# Patient Record
Sex: Female | Born: 1965
Health system: Southern US, Community
[De-identification: ages and names within clinical notes are randomized; demographics above are authoritative.]

## PROBLEM LIST (undated history)

## (undated) DIAGNOSIS — Z789 Other specified health status: Secondary | ICD-10-CM

## (undated) DIAGNOSIS — I10 Essential (primary) hypertension: Secondary | ICD-10-CM

## (undated) DIAGNOSIS — R519 Headache, unspecified: Secondary | ICD-10-CM

## (undated) DIAGNOSIS — M1990 Unspecified osteoarthritis, unspecified site: Secondary | ICD-10-CM

## (undated) DIAGNOSIS — E785 Hyperlipidemia, unspecified: Secondary | ICD-10-CM

## (undated) DIAGNOSIS — K8020 Calculus of gallbladder without cholecystitis without obstruction: Secondary | ICD-10-CM

## (undated) DIAGNOSIS — F1090 Alcohol use, unspecified, uncomplicated: Secondary | ICD-10-CM

## (undated) DIAGNOSIS — Z7289 Other problems related to lifestyle: Secondary | ICD-10-CM

## (undated) HISTORY — PX: CHOLECYSTECTOMY: SHX55

## (undated) HISTORY — DX: Other specified health status: Z78.9

## (undated) HISTORY — DX: Other problems related to lifestyle: Z72.89

## (undated) HISTORY — PX: WISDOM TOOTH EXTRACTION: SHX21

## (undated) HISTORY — DX: Hyperlipidemia, unspecified: E78.5

## (undated) HISTORY — DX: Unspecified osteoarthritis, unspecified site: M19.90

## (undated) HISTORY — DX: Calculus of gallbladder without cholecystitis without obstruction: K80.20

## (undated) HISTORY — DX: Alcohol use, unspecified, uncomplicated: F10.90

## (undated) HISTORY — DX: Essential (primary) hypertension: I10

---

## 2003-06-08 HISTORY — PX: FOOT SURGERY: SHX648

## 2007-06-08 HISTORY — PX: FOOT SURGERY: SHX648

## 2015-06-13 ENCOUNTER — Ambulatory Visit (INDEPENDENT_AMBULATORY_CARE_PROVIDER_SITE_OTHER): Payer: BLUE CROSS/BLUE SHIELD | Admitting: Family Medicine

## 2015-06-13 VITALS — BP 130/108 | HR 86 | Temp 97.7°F | Resp 18 | Ht 66.0 in | Wt 260.4 lb

## 2015-06-13 DIAGNOSIS — L299 Pruritus, unspecified: Secondary | ICD-10-CM

## 2015-06-13 DIAGNOSIS — F17200 Nicotine dependence, unspecified, uncomplicated: Secondary | ICD-10-CM | POA: Diagnosis not present

## 2015-06-13 DIAGNOSIS — I10 Essential (primary) hypertension: Secondary | ICD-10-CM | POA: Diagnosis not present

## 2015-06-13 DIAGNOSIS — Z23 Encounter for immunization: Secondary | ICD-10-CM | POA: Diagnosis not present

## 2015-06-13 LAB — COMPREHENSIVE METABOLIC PANEL
ALT: 110 U/L — AB (ref 6–29)
AST: 42 U/L — ABNORMAL HIGH (ref 10–35)
Albumin: 4.4 g/dL (ref 3.6–5.1)
Alkaline Phosphatase: 94 U/L (ref 33–130)
BUN: 12 mg/dL (ref 7–25)
CHLORIDE: 104 mmol/L (ref 98–110)
CO2: 24 mmol/L (ref 20–31)
Calcium: 9.4 mg/dL (ref 8.6–10.4)
Creat: 0.64 mg/dL (ref 0.50–1.05)
Glucose, Bld: 96 mg/dL (ref 65–99)
POTASSIUM: 4.2 mmol/L (ref 3.5–5.3)
SODIUM: 136 mmol/L (ref 135–146)
TOTAL PROTEIN: 7.3 g/dL (ref 6.1–8.1)
Total Bilirubin: 0.8 mg/dL (ref 0.2–1.2)

## 2015-06-13 LAB — LIPID PANEL
CHOL/HDL RATIO: 3.8 ratio (ref <=5.0)
CHOLESTEROL: 188 mg/dL (ref 125–200)
HDL: 49 mg/dL (ref >=46)
LDL CALC: 123 mg/dL (ref <130)
TRIGLYCERIDES: 81 mg/dL (ref <150)
VLDL: 16 mg/dL (ref <30)

## 2015-06-13 MED ORDER — PERMETHRIN 5 % EX CREA: 1.0000 "application " | TOPICAL_CREAM | Freq: Once | CUTANEOUS | Status: DC

## 2015-06-13 MED ORDER — LISINOPRIL-HYDROCHLOROTHIAZIDE 10-12.5 MG PO TABS
1.0000 | ORAL_TABLET | Freq: Every day | ORAL | Status: DC
Start: 2015-06-13 — End: 2015-09-23

## 2015-06-13 NOTE — Patient Instructions (Addendum)
Apply permethrin lotion from neck to feet at bedtime. The next morning take a shower and wash it off. Bed clothes and sheets. Do this only once.  Take Zyrtec (cetirizine) over-the-counter 1 pill 10 mg every evening for itching  Return if getting more rash or if itching getting worse  Take your blood pressure medicine lisinopril/HCT 10/12.5 one daily, best taken in the morning  Think seriously about picking a quit date and stopping smoking  Work hard on losing weight  Get regular exercise  Plan to schedule a screening colonoscopy sometime in the near future. If you will call the office we will schedule that for you.  Plan to schedule a mammogram sometime in the near future. You can call the [facility] and arrange that for yourself.  Flu shot today

## 2015-06-13 NOTE — Progress Notes (Signed)
Patient ID: Kathryn Delgado, female    DOB: 1966/03/31  Age: 50 y.o. MRN: [id number]  Chief Complaint  Patient presents with  . Hypertension  . skin irritation    severe itch mostly at night - was exposed to scabbies    Subjective:   50 year old lady who moved here from [place] about 3 years ago. She drives back and forth around the country. She is getting a job at [employer] soon. She has a history of hypertension, otherwise is been pretty healthy. Has not had any surgeries. She has not had any pregnancies. She does smoke about half pack of cigarettes a day which she's done for many years. She does not do a regular exercise program, and has had a good deal of weight gain in recent years. She has no major complaints. HEENT is normal. Cardiovascular normal. Respiratory all. GI GU normal. She has not had a colonoscopy ever. Muscular skeletal unremarkable. Neurologic occasional headache, nothing major.  Patient has been having a bad itching especially at night. Her aunt who lives in the same facility with her who has dementia had a similar problem and was treated for presumptive scabies. She still scratches some but not like she was. It is a little hard to tell on her because of her mental status.  Current allergies, medications, problem list, past/family and social histories reviewed.  Objective:  BP 130/108 mmHg  Pulse 86  Temp(Src) 97.7 F (36.5 C) (Oral)  Resp 18  Ht 5\' 6"  (1.676 m)  Wt 260 lb 6.4 oz (118.117 kg)  BMI 42.05 kg/m2  SpO2 99%  Pleasant lady, alert and oriented. TMs are normal. Eyes PERRLA. Throat clear. Neck supple without significant nodes. Chest is clear to auscultation. Heart regular without murmur. Abdomen soft without mass or tenderness. Extremities normal. No major rashes. She has a few little bumps on her abdomen it itch. Has a small cystic area left posterior thigh that looks like a healed superficial abscess.  Assessment & Plan:   Assessment: 1. Essential hypertension    2. Tobacco use disorder   3. Itching   4. Needs flu shot       Plan: Hypertension will need medication. She has been on lisinopril and a diuretic in the past. Talk her about the need to get off of cigarettes. Encourage exercise. Encouraged getting a colonoscopy at some point. Has not had a mammogram. Advised that is a reasonable thing for her to consider also.  Orders Placed This Encounter  Procedures  . Flu Vaccine QUAD 36+ mos IM  . Comprehensive metabolic panel  . Lipid panel    Meds ordered this encounter  Medications  . lisinopril-hydrochlorothiazide (PRINZIDE,ZESTORETIC) 10-12.5 MG tablet    Sig: Take 1 tablet by mouth daily.    Dispense:  90 tablet    Refill:  1  . permethrin (ELIMITE) 5 % cream    Sig: Apply 1 application topically once.    Dispense:  60 g    Refill:  0         Patient Instructions  Apply permethrin lotion from neck to feet at bedtime. The next morning take a shower and wash it off. Bed clothes and sheets. Do this only once.  Take Zyrtec (cetirizine) over-the-counter 1 pill 10 mg every evening for itching  Return if getting more rash or if itching getting worse  Take your blood pressure medicine lisinopril/HCT 10/12.5 one daily, best taken in the morning  Think seriously about picking a quit date and stopping smoking  Work hard on losing weight  Get regular exercise  Plan to schedule a screening colonoscopy sometime in the near future. If you will call the office we will schedule that for you.  Plan to schedule a mammogram sometime in the near future. You can call the [facility] and arrange that for yourself.  Flu shot today     Return in about 3 months (around 09/11/2015).   HOPPER,DAVID, MD 06/13/2015

## 2015-09-23 ENCOUNTER — Ambulatory Visit (INDEPENDENT_AMBULATORY_CARE_PROVIDER_SITE_OTHER): Payer: BLUE CROSS/BLUE SHIELD | Admitting: Physician Assistant

## 2015-09-23 VITALS — BP 126/82 | HR 94 | Temp 98.0°F | Resp 18 | Ht 66.0 in | Wt 251.2 lb

## 2015-09-23 DIAGNOSIS — R202 Paresthesia of skin: Secondary | ICD-10-CM | POA: Diagnosis not present

## 2015-09-23 DIAGNOSIS — M545 Low back pain, unspecified: Secondary | ICD-10-CM

## 2015-09-23 DIAGNOSIS — M79609 Pain in unspecified limb: Secondary | ICD-10-CM

## 2015-09-23 DIAGNOSIS — G8929 Other chronic pain: Secondary | ICD-10-CM

## 2015-09-23 DIAGNOSIS — I10 Essential (primary) hypertension: Secondary | ICD-10-CM

## 2015-09-23 LAB — TSH: TSH: 0.73 mIU/L

## 2015-09-23 MED ORDER — LISINOPRIL-HYDROCHLOROTHIAZIDE 10-12.5 MG PO TABS: 1.0000 | ORAL_TABLET | Freq: Every day | ORAL | Status: DC

## 2015-09-23 NOTE — Patient Instructions (Addendum)
Take one aleve in the morning and and night for two weeks.  After two weeks take one aleve as needed.  If you pain becomes worse over the next 6 months please return for imaging of your back.  Please continue to monitor your drinking and continue to lose weight, as weight loss will also help your back pain.     IF you received an x-ray today, you will receive an invoice from [facility]. Please contact [facility] at [phone] with questions or concerns regarding your invoice.   IF you received labwork today, you will receive an invoice from [facility]. Please contact [facility] at [phone] with questions or concerns regarding your invoice.   Our billing staff will not be able to assist you with questions regarding bills from these companies.  You will be contacted with the lab results as soon as they are available. The fastest way to get your results is to activate your My Chart account. Instructions are located on the last page of this paperwork. If you have not heard from us regarding the results in 2 weeks, please contact this office.

## 2015-09-23 NOTE — Progress Notes (Signed)
09/23/2015 4:11 PM   DOB: 1966/03/30 / MRN: [id number]  SUBJECTIVE:  Kathryn Delgado is a 50 y.o. female presenting for BP medication refills.  Has been taking her medication without fail.  Denies chest pain, SOB, DOE, HA and changes in vision.  Feels well.    Complains of right thigh paresthesia that is mild to moderate, made worse with standing for long periods.  Denies weakness and change of sensation in the leg.  Feels that this is getting worse. Associates mid line low back pain that has been present for years.  No bowel or bladder incontinence.  She has not taken anything because she thought she had a kidney problems.  CHL reveal a normal creatinine and mild elevation in the AST/ALT.  She has cut back on her drinking after being advised by Dr. Alwyn Ren to cut back. She has lost 10 lbs since reducing.    She has No Known Allergies.   She  has a past medical history of Hypertension.    She  reports that she has been smoking.  She does not have any smokeless tobacco history on file. She reports that she drinks about 1.2 oz of alcohol per week. She reports that she does not use illicit drugs. She  reports that she does not currently engage in sexual activity. The patient  has past surgical history that includes Foot surgery (2005).  Her family history includes Diabetes in her brother, mother, and sister; Thyroid disease in her sister and sister.  Review of Systems  Constitutional: Negative for fever.  Gastrointestinal: Negative for nausea.  Musculoskeletal: Positive for back pain. Negative for myalgias, joint pain, falls and neck pain.  Skin: Negative for rash.  Neurological: Negative for dizziness, tremors, sensory change, focal weakness and headaches.    Problem list and medications reviewed and updated by myself where necessary, and exist elsewhere in the encounter.   OBJECTIVE:  BP 126/82 mmHg  Pulse 94  Temp(Src) 98 F (36.7 C) (Oral)  Resp 18  Ht  (1.676 m)  Wt 251 lb 3.2  oz (113.944 kg)  BMI 40.56 kg/m2  SpO2 98%  Physical Exam  Constitutional: She is oriented to person, place, and time. She appears well-nourished. No distress.  Eyes: EOM are normal. Pupils are equal, round, and reactive to light.  Cardiovascular: Normal rate, regular rhythm and normal heart sounds.   Pulmonary/Chest: Effort normal and breath sounds normal. She has no rales.  Abdominal: She exhibits no distension.  Neurological: She is alert and oriented to person, place, and time. She has normal strength. No cranial nerve deficit or sensory deficit. Gait normal.  Reflex Scores:      Patellar reflexes are 2+ on the right side and 2+ on the left side.      Achilles reflexes are 2+ on the right side and 2+ on the left side. Negative for clonus.  Heel and toe walking intact to challenge.  Knee flexion/extension 5/5. Negative for atrophy on the left side with compared to right.   Skin: Skin is dry. She is not diaphoretic.  Psychiatric: She has a normal mood and affect.  Vitals reviewed.   No results found for this or any previous visit (from the past 72 hour(s)).  No results found.  ASSESSMENT AND PLAN  Berthe was seen today for medication refill and thigh numbness.  Diagnoses and all orders for this visit:  Essential hypertension -     lisinopril-hydrochlorothiazide (PRINZIDE,ZESTORETIC) 10-12.5 MG tablet; Take 1 tablet by  mouth daily. -     COMPLETE METABOLIC PANEL WITH GFR -     TSH  Paresthesia and pain of left extremity: Left leg 2/2 to problem 3. Advised naprosyn otc bid with food for the next two weeks, then as needed.  Advised prednisone may help but given nothing else has been tried Naprosyn is a good starting point.  Will hold on rads as negative or positive findings will not change the plan.  She denies a history of diabetes.  She states "I will not go to ortho for this problem I will tell you that right now."  Chronic low back pain: See problem 1.    The patient was  advised to call or return to clinic if she does not see an improvement in symptoms or to seek the care of the closest emergency department if she worsens with the above plan.   Deliah BostonMichael Clark, MHS, PA-C [facility] 09/23/2015 4:11 PM

## 2015-09-24 LAB — COMPLETE METABOLIC PANEL WITH GFR
ALT: 55 U/L — AB (ref 6–29)
AST: 28 U/L (ref 10–35)
Albumin: 4.4 g/dL (ref 3.6–5.1)
Alkaline Phosphatase: 79 U/L (ref 33–130)
BILIRUBIN TOTAL: 0.6 mg/dL (ref 0.2–1.2)
BUN: 11 mg/dL (ref 7–25)
CALCIUM: 9.3 mg/dL (ref 8.6–10.4)
CO2: 19 mmol/L — AB (ref 20–31)
CREATININE: 0.61 mg/dL (ref 0.50–1.05)
Chloride: 106 mmol/L (ref 98–110)
GFR, Est Non African American: >89 mL/min (ref >=60)
Glucose, Bld: 109 mg/dL — ABNORMAL HIGH (ref 65–99)
Potassium: 4 mmol/L (ref 3.5–5.3)
Sodium: 136 mmol/L (ref 135–146)
TOTAL PROTEIN: 7.4 g/dL (ref 6.1–8.1)

## 2015-09-25 ENCOUNTER — Encounter: Payer: Self-pay | Admitting: *Deleted

## 2016-01-12 ENCOUNTER — Ambulatory Visit (INDEPENDENT_AMBULATORY_CARE_PROVIDER_SITE_OTHER): Payer: BLUE CROSS/BLUE SHIELD | Admitting: Urgent Care

## 2016-01-12 VITALS — BP 124/60 | HR 83 | Temp 98.2°F | Resp 17 | Ht 66.0 in | Wt 247.0 lb

## 2016-01-12 DIAGNOSIS — M79601 Pain in right arm: Secondary | ICD-10-CM

## 2016-01-12 DIAGNOSIS — M79602 Pain in left arm: Secondary | ICD-10-CM | POA: Diagnosis not present

## 2016-01-12 DIAGNOSIS — I10 Essential (primary) hypertension: Secondary | ICD-10-CM

## 2016-01-12 DIAGNOSIS — M778 Other enthesopathies, not elsewhere classified: Secondary | ICD-10-CM

## 2016-01-12 DIAGNOSIS — M779 Enthesopathy, unspecified: Secondary | ICD-10-CM

## 2016-01-12 LAB — CBC WITH DIFFERENTIAL/PLATELET
BASOS PCT: 0 %
Basophils Absolute: 0 cells/uL (ref 0–200)
Eosinophils Absolute: 106 cells/uL (ref 15–500)
Eosinophils Relative: 2 %
HEMATOCRIT: 41.1 % (ref 35.0–45.0)
Hemoglobin: 13.1 g/dL (ref 11.7–15.5)
LYMPHS PCT: 38 %
Lymphs Abs: 2014 cells/uL (ref 850–3900)
MCH: 27.8 pg (ref 27.0–33.0)
MCHC: 31.9 g/dL — AB (ref 32.0–36.0)
MCV: 87.3 fL (ref 80.0–100.0)
MPV: 10.9 fL (ref 7.5–12.5)
Monocytes Absolute: 371 cells/uL (ref 200–950)
Monocytes Relative: 7 %
NEUTROS ABS: 2809 {cells}/uL (ref 1500–7800)
NEUTROS PCT: 53 %
Platelets: 212 10*3/uL (ref 140–400)
RBC: 4.71 MIL/uL (ref 3.80–5.10)
RDW: 14 % (ref 11.0–15.0)
WBC: 5.3 10*3/uL (ref 3.8–10.8)

## 2016-01-12 LAB — BASIC METABOLIC PANEL
BUN: 13 mg/dL (ref 7–25)
CHLORIDE: 108 mmol/L (ref 98–110)
CO2: 23 mmol/L (ref 20–31)
Calcium: 9 mg/dL (ref 8.6–10.4)
Creat: 0.66 mg/dL (ref 0.50–1.05)
Glucose, Bld: 88 mg/dL (ref 65–99)
POTASSIUM: 4 mmol/L (ref 3.5–5.3)
Sodium: 141 mmol/L (ref 135–146)

## 2016-01-12 MED ORDER — PREDNISONE 20 MG PO TABS: 40.0000 mg | ORAL_TABLET | Freq: Every day | ORAL | 0 refills | Status: DC

## 2016-01-12 MED ORDER — LISINOPRIL-HYDROCHLOROTHIAZIDE 10-12.5 MG PO TABS: 1.0000 | ORAL_TABLET | Freq: Every day | ORAL | 3 refills | Status: DC

## 2016-01-12 MED ORDER — MELOXICAM 7.5 MG PO TABS: 7.5000 mg | ORAL_TABLET | Freq: Every day | ORAL | 0 refills | Status: DC

## 2016-01-12 NOTE — Patient Instructions (Addendum)
Tendinitis Tendinitis is swelling and inflammation of the tendons. Tendons are band-like tissues that connect muscle to bone. Tendinitis commonly occurs in the:   Shoulders (rotator cuff).  Heels (Achilles tendon).  Elbows (triceps tendon). CAUSES Tendinitis is usually caused by overusing the tendon, muscles, and joints involved. When the tissue surrounding a tendon (synovium) becomes inflamed, it is called tenosynovitis. Tendinitis commonly develops in people whose jobs require repetitive motions. SYMPTOMS  Pain.  Tenderness.  Mild swelling. DIAGNOSIS Tendinitis is usually diagnosed by physical exam. Your health care provider may also order X-rays or other imaging tests. TREATMENT Your health care provider may recommend certain medicines or exercises for your treatment. HOME CARE INSTRUCTIONS   Use a sling or splint for as long as directed by your health care provider until the pain decreases.  Put ice on the injured area.  Put ice in a plastic bag.  Place a towel between your skin and the bag.  Leave the ice on for 15-20 minutes, 3-4 times a day, or as directed by your health care provider.  Avoid using the limb while the tendon is painful. Perform gentle range of motion exercises only as directed by your health care provider. Stop exercises if pain or discomfort increase, unless directed otherwise by your health care provider.  Only take over-the-counter or prescription medicines for pain, discomfort, or fever as directed by your health care provider. SEEK MEDICAL CARE IF:   Your pain and swelling increase.  You develop new, unexplained symptoms, especially increased numbness in the hands. MAKE SURE YOU:   Understand these instructions.  Will watch your condition.  Will get help right away if you are not doing well or get worse.   This information is not intended to replace advice given to you by your health care provider. Make sure you discuss any questions you  have with your health care provider.   Document Released: 05/21/2000 Document Revised: 06/14/2014 Document Reviewed: 08/10/2010 Elsevier Interactive Patient Education 2016 [employer]     IF you received an x-ray today, you will receive an invoice from [facility]. Please contact [facility] at [phone] with questions or concerns regarding your invoice.   IF you received labwork today, you will receive an invoice from [facility]. Please contact [facility] at [phone] with questions or concerns regarding your invoice.   Our billing staff will not be able to assist you with questions regarding bills from these companies.  You will be contacted with the lab results as soon as they are available. The fastest way to get your results is to activate your My Chart account. Instructions are located on the last page of this paperwork. If you have not heard from us regarding the results in 2 weeks, please contact this office.     We recommend that you schedule a mammogram for breast cancer screening. Typically, you do not need a referral to do this. Please contact a local imaging center to schedule your mammogram.  [facility] - [phone]  *ask for the Radiology Department [facility] - [phone]) [phone] or [phone]  [facility] - [phone] [facility] - [phone] [facility] - [phone]  *ask for the Radiology Department [facility] - [phone]  *ask for the Radiology Department [facility] - [phone]  *ask for the Mammography Department [facility] - [phone]

## 2016-01-12 NOTE — Progress Notes (Signed)
    MRN: [id number] DOB: 28-Aug-1965  Subjective:   Kathryn Delgado is a 50 y.o. female presenting for chief complaint of Medication Refill (prinzide) and Arm Pain (Bilateral. x 2months. NKI)  HTN - Managed with lis-HCTZ. Reports compliance. Denies dizziness, headache, chest pain, shortness of breath, heart racing, palpitations, nausea, vomiting, abdominal pain, hematuria, lower leg swelling, numbness or tingling. Drinks water every day, 6 bottles. Smokes 1/2 ppd. Drinks 2 beers per day.  Arm Pain - Reports 2 month history of bilateral forearm pain. Pain is very achy almost constant. Has difficulty grasping due to pain, sleeping. Has used Alleve, APAP, otc creams with minimal relief. Denies trauma, fever, redness, bruising. 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 Her job is very physically demanding.   Kathryn Delgado has a current medication list which includes the following prescription(s): lisinopril-hydrochlorothiazide. Also has No Known Allergies.  Kathryn Delgado  has a past medical history of Hypertension. Also  has a past surgical history that includes Foot surgery (2005).  Objective:   Vitals: BP 124/60 (BP Location: Left Arm, Cuff Size: Large)   Pulse 83   Temp 98.2 F (36.8 C) (Oral)   Resp 17   Ht 5\' 6"  (1.676 m)   Wt 247 lb (112 kg)   SpO2 100%   BMI 39.87 kg/m   BP Readings from Last 3 Encounters:  01/12/16 124/60  09/23/15 126/82  06/13/15 (!) 130/108   Physical Exam  Constitutional: She is oriented to person, place, and time. She appears well-developed and well-nourished.  Cardiovascular: Normal rate, regular rhythm and intact distal pulses.  Exam reveals no gallop and no friction rub.   No murmur heard. Pulmonary/Chest: No respiratory distress. She has no wheezes. She has no rales.  Neurological: She is alert and oriented to person, place, and time.   Assessment and Plan :   1. Essential hypertension - Refilled her BP medication  for 1 year. Recheck in 6 months.  2. Bilateral arm pain 3. Tendinitis of forearm - Offered work restrictions but the patient refused. Will use short steroid course given that she has tried multiple otc medications. She refused PT, will consider referral to ortho if no improvement.  Wallis BambergMario Garlon Tuggle, PA-C [facility] [phone] 01/12/2016 2:26 PM

## 2016-01-13 LAB — SEDIMENTATION RATE: Sed Rate: 6 mm/hr (ref 0–20)

## 2016-01-15 ENCOUNTER — Encounter: Payer: Self-pay | Admitting: Urgent Care

## 2016-02-05 ENCOUNTER — Ambulatory Visit (INDEPENDENT_AMBULATORY_CARE_PROVIDER_SITE_OTHER): Payer: BLUE CROSS/BLUE SHIELD | Admitting: Physician Assistant

## 2016-02-05 VITALS — BP 149/98 | HR 79 | Temp 98.7°F | Resp 16 | Ht 66.0 in | Wt 247.6 lb

## 2016-02-05 DIAGNOSIS — M779 Enthesopathy, unspecified: Secondary | ICD-10-CM | POA: Diagnosis not present

## 2016-02-05 DIAGNOSIS — M778 Other enthesopathies, not elsewhere classified: Secondary | ICD-10-CM

## 2016-02-05 DIAGNOSIS — M79639 Pain in unspecified forearm: Secondary | ICD-10-CM | POA: Diagnosis not present

## 2016-02-05 NOTE — Patient Instructions (Signed)
     IF you received an x-ray today, you will receive an invoice from [facility]. Please contact [facility] at [phone] with questions or concerns regarding your invoice.   IF you received labwork today, you will receive an invoice from [facility]. Please contact [facility] at [phone] with questions or concerns regarding your invoice.   Our billing staff will not be able to assist you with questions regarding bills from these companies.  You will be contacted with the lab results as soon as they are available. The fastest way to get your results is to activate your My Chart account. Instructions are located on the last page of this paperwork. If you have not heard from us regarding the results in 2 weeks, please contact this office.     We recommend that you schedule a mammogram for breast cancer screening. Typically, you do not need a referral to do this. Please contact a local imaging center to schedule your mammogram.  [facility] - [phone]  *ask for the Radiology Department [facility]) - [phone] or [phone]  [facility] - [phone] [facility] - [phone] [facility] - [phone]  *ask for the Radiology Department [facility] - [phone]  *ask for the Radiology Department [facility] - [phone]  *ask for the Mammography Department [facility] - [phone]  

## 2016-02-05 NOTE — Progress Notes (Signed)
   Kathryn SeeCathy Beidler  MRN: [id number] DOB: Apr 05, 1966  PCP: No primary care provider on file.  Subjective:  Pt is a 50 year old female presenting to clinic for follow-up bilateral forearm pain x 3 months.   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 Her pain developed a month after she started there.  Her pain originally had gradual onset, however now is constant and described as "achy". It keeps her up at night, she has a difficult time getting comfortable. Denies numbness and tingling.    Tried Aleve, Tylenol and creams, no relief. Bought an arm band from [employer], no relief.   She saw PA Gurney MaxinMike Mani  three weeks ago. PT and work restrictions were offered, however patient refused. Urban GibsonMani started her on a temporary steroid course and Mobic, asked her to RTC if no improvement. She is not better. Still refuses referral to PT or ortho, she has two jobs and does not have time, she doesn't want to pay the copay.   History carpal tunnel surgery b/l 1997.  Foot surgery plantar fascitis left 2005.   Review of Systems  Constitutional: Negative.   Cardiovascular: Negative.   Musculoskeletal: Positive for myalgias (bilateral forearms).  Skin: Negative.   Neurological: Negative for weakness and numbness.    There are no active problems to display for this patient.   Current Outpatient Prescriptions on File Prior to Visit  Medication Sig Dispense Refill  . lisinopril-hydrochlorothiazide (PRINZIDE,ZESTORETIC) 10-12.5 MG tablet Take 1 tablet by mouth daily. 90 tablet 3  . meloxicam (MOBIC) 7.5 MG tablet Take 1 tablet (7.5 mg total) by mouth daily. 30 tablet 0   No current facility-administered medications on file prior to visit.     No Known Allergies  Objective:  BP (!) 149/98 (BP Location: Left Arm, Patient Position: Sitting, Cuff Size: Large)   Pulse 79   Temp 98.7 F (37.1 C) (Oral)   Resp 16   Ht 5\' 6"  (1.676 m)   Wt 247 lb 9.6 oz (112.3 kg)   SpO2 100%   BMI 39.96 kg/m    Physical Exam  Constitutional: She is oriented to person, place, and time and well-developed, well-nourished, and in no distress. No distress.  Cardiovascular: Normal rate, regular rhythm and normal heart sounds.   Pulmonary/Chest: Effort normal. No respiratory distress.  Musculoskeletal:  No swelling, deformities or erythema appreciated. TTP along posterolateral arm, tenderness increases over extensor tendons b/l. + Cozen's test b/l.   Neurological: She is alert and oriented to person, place, and time. GCS score is 15.  Skin: Skin is warm and dry.  Psychiatric: Mood, memory, affect and judgment normal.  Vitals reviewed.   Assessment and Plan :  1. Forearm tendonitis 2. Pain in forearm, unspecified laterality - Patient refused PT referral and work restriction - Discussed with patient cause and treatment of lateral epicondylitis. Stretches printed out for patient.  Encouraged patient to reconsider physical work with repetitive forearm requirements. She is not interested in this.  - Printed off address and telephone number of [employer]. Encouraged patient to get a proper forearm armband for tendonitis.  - RTC if symptoms do not improve. She left unhappy with prervious and current treatment plans.    Marco CollieWhitney Alexy Heldt, PA-C  [facility] 02/05/2016 2:05 PM

## 2017-02-10 ENCOUNTER — Encounter: Payer: Self-pay | Admitting: Family Medicine

## 2017-02-10 ENCOUNTER — Ambulatory Visit (INDEPENDENT_AMBULATORY_CARE_PROVIDER_SITE_OTHER): Payer: BLUE CROSS/BLUE SHIELD | Admitting: Family Medicine

## 2017-02-10 VITALS — BP 126/88 | HR 83 | Temp 97.8°F | Resp 18 | Ht 67.21 in | Wt 236.8 lb

## 2017-02-10 DIAGNOSIS — I10 Essential (primary) hypertension: Secondary | ICD-10-CM

## 2017-02-10 DIAGNOSIS — M7632 Iliotibial band syndrome, left leg: Secondary | ICD-10-CM | POA: Diagnosis not present

## 2017-02-10 DIAGNOSIS — Z7189 Other specified counseling: Secondary | ICD-10-CM

## 2017-02-10 MED ORDER — LISINOPRIL-HYDROCHLOROTHIAZIDE 10-12.5 MG PO TABS: 1.0000 | ORAL_TABLET | Freq: Every day | ORAL | 1 refills | Status: DC

## 2017-02-10 NOTE — Progress Notes (Signed)
9/6/20182:05 PM  Kathryn Delgado 1966-04-18, 51 y.o. female [id number]  Chief Complaint  Patient presents with  . Medication Refill    lisinopril    HPI:   Patient is a 51 y.o. female with past medical history significant for HTN who presents today for medication refill. Last pill was 2 days ago. Does not check her BP, does not follow diet or exercise. Smokes, not interested in quitting.  Otherwise reports intermittent left lateral thigh pain that radiates down to the knee, occ with some numbness. No weakness, has been present for over a year. Has not done anything for it. Denies any hip pain. Denies any knee pain.  [facility] 2/9 02/10/2017 02/05/2016 01/12/2016  Decreased Interest 0 0 0  Down, Depressed, Hopeless 0 0 0  PHQ - 2 Score 0 0 0    No Known Allergies  Current Outpatient Prescriptions on File Prior to Visit  Medication Sig Dispense Refill  . meloxicam (MOBIC) 7.5 MG tablet Take 1 tablet (7.5 mg total) by mouth daily. (Patient not taking: Reported on 02/10/2017) 30 tablet 0   No current facility-administered medications on file prior to visit.     Past Medical History:  Diagnosis Date  . Hypertension     Past Surgical History:  Procedure Laterality Date  . FOOT SURGERY  2005    Social History  Substance Use Topics  . Smoking status: Current Every Day Smoker    Packs/day: 0.50  . Smokeless tobacco: Never Used  . Alcohol use 1.2 oz/week    2 Cans of beer per week    Family History  Problem Relation Age of Onset  . Diabetes Mother   . Thyroid disease Sister   . Diabetes Sister   . Thyroid disease Sister   . Diabetes Brother     Review of Systems  Constitutional: Negative for chills and fever.  Respiratory: Negative for cough and shortness of breath.   Cardiovascular: Negative for chest pain, palpitations and leg swelling.  Gastrointestinal: Negative for abdominal pain, nausea and vomiting.     OBJECTIVE:  Blood pressure 126/88, pulse  83, temperature 97.8 F (36.6 C), temperature source Oral, resp. rate 18, height 5' 7.21" (1.707 m), weight 236 lb 12.8 oz (107.4 kg), SpO2 98 %.  Physical Exam  Constitutional: She is oriented to person, place, and time and well-developed, well-nourished, and in no distress.  HENT:  Head: Normocephalic and atraumatic.  Mouth/Throat: Oropharynx is clear and moist. No oropharyngeal exudate.  Eyes: Pupils are equal, round, and reactive to light. EOM are normal. No scleral icterus.  Neck: Neck supple.  Cardiovascular: Normal rate, regular rhythm and normal heart sounds.  Exam reveals no gallop and no friction rub.   No murmur heard. Pulmonary/Chest: Effort normal and breath sounds normal. She has no wheezes. She has no rales.  Musculoskeletal: She exhibits no edema.  Left hip FROM, nontender. LEFT IT band TTP  Neurological: She is alert and oriented to person, place, and time. Gait normal.  Skin: Skin is warm and dry.       ASSESSMENT and PLAN:  1. Essential hypertension Refilling meds today. Discussed exercise and diet. - CBC with Differential - Comprehensive metabolic panel - Lipid panel - TSH - lisinopril-hydrochlorothiazide (PRINZIDE,ZESTORETIC) 10-12.5 MG tablet; Take 1 tablet by mouth daily.  Dispense: 90 tablet; Refill: 1  2. It band syndrome, left Discussed diagnosis, conservative management. Provided patient educational handout.  3. Counseling on health promotion and disease prevention Had long discussion regarding  recommended cancer screenings. Patient at this time declines with informed consent. Low risk for cervical cancer as she denies ever having sexual intercourse. Provided information regarding breast and colon cancer screening. She declined flu vaccine as well.       Myles Lipps, MD Primary Care at [facility] [address] [place], [place] [address] Ph.  [phone] Fax [phone]

## 2017-02-10 NOTE — Patient Instructions (Addendum)
IF you received an x-ray today, you will receive an invoice from [facility]. Please contact [facility] at [phone] with questions or concerns regarding your invoice.   IF you received labwork today, you will receive an invoice from [facility]. Please contact [facility] at [phone] with questions or concerns regarding your invoice.   Our billing staff will not be able to assist you with questions regarding bills from these companies.  You will be contacted with the lab results as soon as they are available. The fastest way to get your results is to activate your My Chart account. Instructions are located on the last page of this paperwork. If you have not heard from us regarding the results in 2 weeks, please contact this office.     Colorectal Cancer Screening Colorectal cancer screening is a group of tests used to check for colorectal cancer. Colorectal refers to your colon and rectum. Your colon and rectum are located at the end of your large intestine and carry your bowel movements out of your body. Why is colorectal cancer screening done? It is common for abnormal growths (polyps) to form in the lining of your colon, especially as you get older. These polyps can be cancerous or become cancerous. If colorectal cancer is found at an early stage, it is treatable. Who should be screened for colorectal cancer? Screening is recommended for all adults at average risk starting at age 52. Tests may be recommended every 1 to 10 years. Your health care provider may recommend earlier or more frequent screening if you have:  A history of colorectal cancer or polyps.  A family member with a history of colorectal cancer or polyps.  Inflammatory bowel disease, such as ulcerative colitis or Crohn disease.  A type of hereditary colon cancer syndrome.  Colorectal cancer symptoms.  Types of screening tests There are several types of colorectal screening tests. They  include:  Guaiac-based fecal occult blood testing.  Fecal immunochemical test (FIT).  Stool DNA test.  Barium enema.  Virtual colonoscopy.  Sigmoidoscopy. During this test, a sigmoidoscope is used to examine your rectum and lower colon. A sigmoidoscope is a flexible tube with a camera that is inserted through your anus into your rectum and lower colon.  Colonoscopy. During this test, a colonoscope is used to examine your entire colon. A colonoscope is a long, thin, flexible tube with a camera. This test examines your entire colon and rectum.  This information is not intended to replace advice given to you by your health care provider. Make sure you discuss any questions you have with your health care provider. Document Released: 11/11/2009 Document Revised: 01/01/2016 Document Reviewed: 08/30/2013 Elsevier Interactive Patient Education  2017 [employer]  Mammogram A mammogram is an X-ray of the breasts that is done to check for abnormal changes. This procedure can screen for and detect any changes that may suggest breast cancer. A mammogram can also identify other changes and variations in the breast, such as:  Inflammation of the breast tissue (mastitis).  An infected area that contains a collection of pus (abscess).  A fluid-filled sac (cyst).  Fibrocystic changes. This is when breast tissue becomes denser, which can make the tissue feel rope-like or uneven under the skin.  Tumors that are not cancerous (benign).  Tell a health care provider about:  Any allergies you have.  If you have breast implants.  If you have had previous breast disease, biopsy, or surgery.  If you are breastfeeding.  Any possibility that you could be pregnant, if this applies.  If you are younger than age 58.  If you have a family history of breast cancer. What are the risks? Generally, this is a safe procedure. However, problems may occur, including:  Exposure to radiation. Radiation  levels are very low with this test.  The results being misinterpreted.  The need for further tests.  The inability of the mammogram to detect certain cancers.  What happens before the procedure?  Schedule your test about 1-2 weeks after your menstrual period. This is usually when your breasts are the least tender.  If you have had a mammogram done at a different facility in the past, get the mammogram X-rays or have them sent to your current exam facility in order to compare them.  Wash your breasts and under your arms the day of the test.  Do not wear deodorants, perfumes, lotions, or powders anywhere on your body on the day of the test.  Remove any jewelry from your neck.  Wear clothes that you can change into and out of easily. What happens during the procedure?  You will undress from the waist up and put on a gown.  You will stand in front of the X-ray machine.  Each breast will be placed between two plastic or glass plates. The plates will compress your breast for a few seconds. Try to stay as relaxed as possible during the procedure. This does not cause any harm to your breasts and any discomfort you feel will be very brief.  X-rays will be taken from different angles of each breast. The procedure may vary among health care providers and hospitals. What happens after the procedure?  The mammogram will be examined by a specialist (radiologist).  You may need to repeat certain parts of the test, depending on the quality of the images. This is commonly done if the radiologist needs a better view of the breast tissue.  Ask when your test results will be ready. Make sure you get your test results.  You may resume your normal activities. This information is not intended to replace advice given to you by your health care provider. Make sure you discuss any questions you have with your health care provider. Document Released: 05/21/2000 Document Revised: 10/27/2015 Document  Reviewed: 08/02/2014 Elsevier Interactive Patient Education  2017 [employer].

## 2017-02-11 ENCOUNTER — Encounter: Payer: Self-pay | Admitting: Family Medicine

## 2017-02-11 LAB — COMPREHENSIVE METABOLIC PANEL
ALT: 47 IU/L — ABNORMAL HIGH (ref 0–32)
AST: 23 IU/L (ref 0–40)
Albumin/Globulin Ratio: 1.6 (ref 1.2–2.2)
Albumin: 4.2 g/dL (ref 3.5–5.5)
Alkaline Phosphatase: 69 IU/L (ref 39–117)
BUN/Creatinine Ratio: 26 — ABNORMAL HIGH (ref 9–23)
BUN: 12 mg/dL (ref 6–24)
Bilirubin Total: 0.5 mg/dL (ref 0.0–1.2)
CO2: 20 mmol/L (ref 20–29)
Calcium: 9.1 mg/dL (ref 8.7–10.2)
Chloride: 104 mmol/L (ref 96–106)
Creatinine, Ser: 0.47 mg/dL — ABNORMAL LOW (ref 0.57–1.00)
GFR calc Af Amer: 132 mL/min/{1.73_m2} (ref >59)
GFR calc non Af Amer: 115 mL/min/{1.73_m2} (ref >59)
Globulin, Total: 2.7 g/dL (ref 1.5–4.5)
Glucose: 101 mg/dL — ABNORMAL HIGH (ref 65–99)
Potassium: 4.1 mmol/L (ref 3.5–5.2)
Sodium: 139 mmol/L (ref 134–144)
Total Protein: 6.9 g/dL (ref 6.0–8.5)

## 2017-02-11 LAB — CBC WITH DIFFERENTIAL/PLATELET
Basophils Absolute: 0 10*3/uL (ref 0.0–0.2)
Basos: 0 %
EOS (ABSOLUTE): 0.1 10*3/uL (ref 0.0–0.4)
Eos: 1 %
Hematocrit: 42.2 % (ref 34.0–46.6)
Hemoglobin: 13.4 g/dL (ref 11.1–15.9)
Immature Grans (Abs): 0 10*3/uL (ref 0.0–0.1)
Immature Granulocytes: 0 %
Lymphocytes Absolute: 2 10*3/uL (ref 0.7–3.1)
Lymphs: 37 %
MCH: 28 pg (ref 26.6–33.0)
MCHC: 31.8 g/dL (ref 31.5–35.7)
MCV: 88 fL (ref 79–97)
Monocytes Absolute: 0.3 10*3/uL (ref 0.1–0.9)
Monocytes: 6 %
Neutrophils Absolute: 3 10*3/uL (ref 1.4–7.0)
Neutrophils: 56 %
Platelets: 194 10*3/uL (ref 150–379)
RBC: 4.78 x10E6/uL (ref 3.77–5.28)
RDW: 14.1 % (ref 12.3–15.4)
WBC: 5.4 10*3/uL (ref 3.4–10.8)

## 2017-02-11 LAB — LIPID PANEL
Chol/HDL Ratio: 2.6 ratio (ref 0.0–4.4)
Cholesterol, Total: 160 mg/dL (ref 100–199)
HDL: 61 mg/dL (ref >39)
LDL Calculated: 88 mg/dL (ref 0–99)
Triglycerides: 53 mg/dL (ref 0–149)
VLDL Cholesterol Cal: 11 mg/dL (ref 5–40)

## 2017-02-11 LAB — TSH: TSH: 0.756 u[IU]/mL (ref 0.450–4.500)

## 2017-08-17 ENCOUNTER — Ambulatory Visit
Admission: RE | Admit: 2017-08-17 | Discharge: 2017-08-17 | Disposition: A | Discharge status: Home / Self Care | Payer: 59 | Source: Ambulatory Visit | Attending: Family Medicine | Admitting: Family Medicine

## 2017-08-17 ENCOUNTER — Ambulatory Visit (INDEPENDENT_AMBULATORY_CARE_PROVIDER_SITE_OTHER): Payer: 59 | Admitting: Family Medicine

## 2017-08-17 ENCOUNTER — Encounter: Payer: Self-pay | Admitting: Family Medicine

## 2017-08-17 VITALS — BP 120/80 | HR 97 | Temp 98.6°F | Resp 15 | Ht 67.75 in | Wt 246.6 lb

## 2017-08-17 DIAGNOSIS — F17200 Nicotine dependence, unspecified, uncomplicated: Secondary | ICD-10-CM

## 2017-08-17 DIAGNOSIS — M25562 Pain in left knee: Secondary | ICD-10-CM

## 2017-08-17 DIAGNOSIS — I10 Essential (primary) hypertension: Secondary | ICD-10-CM | POA: Diagnosis not present

## 2017-08-17 NOTE — Patient Instructions (Signed)
Take 2 Aleve twice daily with food, use ice 2-3 times per day for 15-20 minutes at a time and elevate when you can.  We will call you with your XR results.   Follow up in 2 weeks.

## 2017-08-17 NOTE — Progress Notes (Signed)
   Subjective:    Patient ID: Kathryn Delgado, female    DOB: 1966-04-16, 52 y.o.   MRN: [id number]  HPI Chief Complaint  Patient presents with  . new pt    new pt get established. knee pain   She is new to the practice and here to establish care.  Previous medical care: UC. No PCP in 20 years.  Last CPE:  20 years   Complains of a 2-3 month history of left knee pain and swelling. States pain is medial and lateral. Denies injury or history of knee pain.  Denies popping, locking or giving away.  Denies fever, chills, nausea, vomiting. No other arthralgias or myalgias.   Other providers: none   HTN- diagnosed 2-3 years and states BP has been well controlled.   Social history: Lives with girlfriend, is not working currently.  Smoker- 40 years.  Former cocaine use. Drinks socially.   Health maintenance:  Mammogram: never  Colonoscopy: never  Last Gynecological Exam: never  Last Menstrual cycle: 7 years ago    Reviewed allergies, medications, past medical, surgical, family, and social history.    Review of Systems Pertinent positives and negatives in the history of present illness.     Objective:   Physical Exam  Constitutional: She appears well-developed and well-nourished. No distress.  Cardiovascular: Normal rate, regular rhythm, normal heart sounds and intact distal pulses.  Pulmonary/Chest: Effort normal and breath sounds normal.  Musculoskeletal:       Left knee: She exhibits swelling. She exhibits normal range of motion, no LCL laxity and no MCL laxity. Tenderness found. Lateral joint line tenderness noted.  No erythema or warmth. Lateral joint line tenderness. No laxity. Negative McMurray's. LLE is neurovascularly intact.   Skin: Skin is warm and dry.   BP 120/80   Pulse 97   Temp 98.6 F (37 C) (Oral)   Resp 15   Ht 5' 7.75" (1.721 m)   Wt 246 lb 9.6 oz (111.9 kg)   SpO2 99%   BMI 37.77 kg/m       Assessment & Plan:  Acute pain of left knee - Plan:  DG Knee Complete 4 Views Left  Essential hypertension  Smoker  Will send her for an XR and try conservative treatment with 2 Aleve twice daily, ice and elevation.  BP is in goal range.  Discussed stopping smoking, she is not willing.  She is deficient in all areas of preventive health. She will consider scheduling a CPE.  Follow up in 2 weeks for knee pain. Possible injection pending XR and how she is doing at that time. She does not want to be referred to ortho.

## 2017-08-31 ENCOUNTER — Ambulatory Visit: Payer: 59 | Admitting: Family Medicine

## 2017-08-31 ENCOUNTER — Encounter: Payer: Self-pay | Admitting: Family Medicine

## 2017-08-31 ENCOUNTER — Other Ambulatory Visit: Payer: Self-pay

## 2017-08-31 ENCOUNTER — Ambulatory Visit (INDEPENDENT_AMBULATORY_CARE_PROVIDER_SITE_OTHER): Payer: 59 | Admitting: Family Medicine

## 2017-08-31 ENCOUNTER — Telehealth: Payer: Self-pay | Admitting: Family Medicine

## 2017-08-31 VITALS — BP 112/80 | HR 78 | Wt 249.8 lb

## 2017-08-31 DIAGNOSIS — M25462 Effusion, left knee: Secondary | ICD-10-CM | POA: Diagnosis not present

## 2017-08-31 DIAGNOSIS — I10 Essential (primary) hypertension: Secondary | ICD-10-CM

## 2017-08-31 DIAGNOSIS — M25562 Pain in left knee: Secondary | ICD-10-CM

## 2017-08-31 MED ORDER — TRIAMCINOLONE ACETONIDE 40 MG/ML IJ SUSP
40.0000 mg | Freq: Once | INTRAMUSCULAR | Status: AC
  Administered 2017-08-31: 40 mg via INTRAMUSCULAR

## 2017-08-31 MED ORDER — LISINOPRIL-HYDROCHLOROTHIAZIDE 10-12.5 MG PO TABS: 1.0000 | ORAL_TABLET | Freq: Every day | ORAL | 1 refills | Status: DC

## 2017-08-31 MED ORDER — LIDOCAINE HCL 2 % IJ SOLN
10.0000 mL | Freq: Once | INTRAMUSCULAR | Status: AC
  Administered 2017-08-31: 200 mg via INTRADERMAL

## 2017-08-31 NOTE — Progress Notes (Signed)
   Subjective:    Patient ID: Kathryn Delgado, female    DOB: 1966-01-08, 52 y.o.   MRN: [id number]  HPI She is here for reevaluation of continued difficulty with left knee pain.  She has been using an anti-inflammatory be getting no results.  No popping, locking or grinding.  Just pain.   Review of Systems     Objective:   Physical Exam Alert and in no distress.  Moderate effusion is noted.  Is not hot red or tender.  Anterior drawer negative.  McMurray's testing negative.  Medial and lateral collateral ligaments intact. X-rays are negative      Assessment & Plan:  Acute pain of left knee - Plan: Cell Ct, Synovial w/o Crystals  Effusion of left knee - Plan: Cell Ct, Synovial w/o Crystals  I explained the next step would be to draw some fluid off to send off for evaluation.  She was acceptable of that. The knee was prepped laterally.  The superior lateral aspect of the patella was identified and marked just underneath that.  A 14-gauge needle was inserted into the joint space.  20 cc of clear yellow fluid was removed and 40 mg of Kenalog and 3 cc of Xylocaine was injected into the joint.  She did obtain relief of her symptoms.  I will send the fluid off for further evaluation.

## 2017-08-31 NOTE — Addendum Note (Signed)
Addended by: Renelda LomaHENRY, Kaylanie Capili on: 08/31/2017 02:27 PM   Modules accepted: Orders

## 2017-08-31 NOTE — Telephone Encounter (Signed)
done

## 2017-08-31 NOTE — Telephone Encounter (Signed)
New Message  Pt verbalized needing refill on her BP medications she only has 6 left.   [facility] [address].

## 2017-09-01 ENCOUNTER — Ambulatory Visit: Payer: 59 | Admitting: Family Medicine

## 2017-09-01 LAB — CELL CT, SYNOVIAL W/O CRYSTALS
Eos, Fluid: 0 %
LINING CELLS, SYNOVIAL: 0 %
Lymphs, Fluid: 54 %
Macrophages Fld: 44 %
NUC CELL # FLD: 390 {cells}/uL — AB (ref 0–200)
Polys, Fluid: 2 %

## 2017-09-24 LAB — BODY FLUID CRYSTAL

## 2017-09-24 LAB — SPECIMEN STATUS REPORT

## 2017-09-30 ENCOUNTER — Other Ambulatory Visit: Payer: Self-pay

## 2017-09-30 ENCOUNTER — Telehealth: Payer: Self-pay | Admitting: Family Medicine

## 2017-09-30 DIAGNOSIS — I10 Essential (primary) hypertension: Secondary | ICD-10-CM

## 2017-09-30 MED ORDER — LISINOPRIL-HYDROCHLOROTHIAZIDE 10-12.5 MG PO TABS: 1.0000 | ORAL_TABLET | Freq: Every day | ORAL | 1 refills | Status: DC

## 2017-09-30 NOTE — Telephone Encounter (Signed)
Pt called and needs RX [facility]ril [facility] to [facility] on [address], [place], [place] [address]  [facility] not take [facility] pt can be reached at [phone]

## 2017-09-30 NOTE — Telephone Encounter (Signed)
Done and pt is aware KH 

## 2017-10-26 ENCOUNTER — Encounter: Payer: Self-pay | Admitting: Family Medicine

## 2017-10-26 ENCOUNTER — Ambulatory Visit (INDEPENDENT_AMBULATORY_CARE_PROVIDER_SITE_OTHER): Payer: 59 | Admitting: Family Medicine

## 2017-10-26 ENCOUNTER — Ambulatory Visit: Payer: 59 | Admitting: Family Medicine

## 2017-10-26 VITALS — BP 138/88 | HR 88 | Temp 98.0°F | Ht 67.0 in | Wt 249.6 lb

## 2017-10-26 DIAGNOSIS — M25462 Effusion, left knee: Secondary | ICD-10-CM

## 2017-10-26 DIAGNOSIS — M25562 Pain in left knee: Secondary | ICD-10-CM | POA: Diagnosis not present

## 2017-10-26 MED ORDER — TRIAMCINOLONE ACETONIDE 40 MG/ML IJ SUSP
40.0000 mg | Freq: Once | INTRAMUSCULAR | Status: AC
  Administered 2017-10-26: 40 mg via INTRAMUSCULAR

## 2017-10-26 MED ORDER — LIDOCAINE HCL 2 % IJ SOLN
10.0000 mL | Freq: Once | INTRAMUSCULAR | Status: AC
  Administered 2017-10-26: 200 mg via INTRADERMAL

## 2017-10-26 NOTE — Progress Notes (Signed)
   Subjective:    Patient ID: Kathryn Delgado, female    DOB: 1965/11/14, 52 y.o.   MRN: [id number]  HPI She is here for recheck on knee pain.  After her last injection she did quite well.  Then she started working again and developed some more knee pain especially posteriorly but no popping, locking or grinding.  She would like another injection.   Review of Systems     Objective:   Physical Exam Alert and in no distress.  Exam of left knee does show a minimal effusion.  Negative anterior drawer.  Medial lateral collateral ligaments intact.  McMurray's testing was equivocal. X-rays and lab work were reviewed.     Assessment & Plan:  Acute pain of left knee  Effusion of left knee  I discussed the diagnosis with her and the fact that this is relatively quick after her last injection.  I agreed to give her another injection. The knee was prepped laterally.  40 mg of Kenalog and 3 cc of Xylocaine was injected into the joint space.  Explained that if she continues have difficulty, we will need to get an MRI to evaluate this further.

## 2017-10-26 NOTE — Addendum Note (Signed)
Addended by: Renelda Loma on: 10/26/2017 03:09 PM   Modules accepted: Orders

## 2017-11-28 ENCOUNTER — Encounter: Payer: Self-pay | Admitting: Family Medicine

## 2017-11-28 ENCOUNTER — Ambulatory Visit: Payer: 59 | Admitting: Family Medicine

## 2017-11-28 VITALS — BP 110/70 | HR 81 | Temp 97.7°F | Wt 246.0 lb

## 2017-11-28 DIAGNOSIS — M25562 Pain in left knee: Secondary | ICD-10-CM

## 2017-11-28 DIAGNOSIS — M25462 Effusion, left knee: Secondary | ICD-10-CM

## 2017-11-28 NOTE — Progress Notes (Signed)
   Subjective:    Patient ID: Kathryn Delgado, female    DOB: [birth year], 52 y.o.   MRN: [id number]  HPI He is here for continued difficulty with left knee pain.  He did have an injection in late March.  The injection worked for approximately 2 months but then she noted the onset of intermittent pain.  She now has a new job requiring her to stand more.  The pain his become more intense and unrelieved with 2 Aleve twice per day.  She points to the lateral aspect of her knee.   Review of Systems     Objective:   Physical Exam Alert and in no distress.  Left knee exam does show an effusion.  Negative anterior drawer.  No patellar tendon tenderness.  Medial and lateral collateral ligaments intact.  McMurray's testing did cause pain laterally.       Assessment & Plan:  Effusion of left knee - Plan: MR Knee Left  Wo Contrast  Acute pain of left knee - Plan: MR Knee Left  Wo Contrast  Recommend she switch to ibuprofen 800 mg 3 times daily and supplement this with Tylenol.  Since she is not responded to conservative care, I will get an MRI.  Discussed the fact that we might have to do PT before this.

## 2017-11-28 NOTE — Patient Instructions (Signed)
Switch to taking ibuprofen which is Advil or Motrin.  Take 4 pills 3 times per day .  You can also take Tylenol with that if you need to.  The Tylenol dosing would be 2 pills 4 times per day

## 2017-11-29 ENCOUNTER — Telehealth: Payer: Self-pay

## 2017-11-29 ENCOUNTER — Other Ambulatory Visit: Payer: Self-pay

## 2017-11-29 ENCOUNTER — Telehealth: Payer: Self-pay | Admitting: Family Medicine

## 2017-11-29 DIAGNOSIS — M25562 Pain in left knee: Secondary | ICD-10-CM

## 2017-11-29 NOTE — Telephone Encounter (Signed)
Referral was put in and awaiting call on appt time and date  [facility]

## 2017-11-29 NOTE — Telephone Encounter (Signed)
Pt called and states she needs out of work note.  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  I asked her how long she needed the note,  She states until MRI results.  Pt ph [phone]

## 2017-11-29 NOTE — Telephone Encounter (Signed)
Called ortho to set up an appt for pt with Dr. Cleophas DunkerWhitfield or pa . First aviaible. KH

## 2017-11-29 NOTE — Telephone Encounter (Signed)
Refer her to orthopedics and hold off on the MRI since she says she cannot work

## 2017-11-30 ENCOUNTER — Telehealth: Payer: Self-pay

## 2017-11-30 NOTE — Telephone Encounter (Signed)
Pt called and wanted to know about her referral to ortho. Pt was advised that her appt was made for the 2nd of July . Pt was also advised that we could not write her out of work and that is why we made the appt for urgent . Pt will be called back later to give info about where the oice is located. KH

## 2017-12-01 ENCOUNTER — Telehealth: Payer: Self-pay

## 2017-12-01 NOTE — Telephone Encounter (Signed)
Pt was called to let her know that the address to ortho is [address] [place] [id number]   [phone]. LVM

## 2017-12-06 ENCOUNTER — Encounter (INDEPENDENT_AMBULATORY_CARE_PROVIDER_SITE_OTHER): Payer: Self-pay | Admitting: Orthopaedic Surgery

## 2017-12-06 ENCOUNTER — Ambulatory Visit (INDEPENDENT_AMBULATORY_CARE_PROVIDER_SITE_OTHER): Payer: 59 | Admitting: Orthopaedic Surgery

## 2017-12-06 VITALS — BP 145/84 | HR 82 | Ht 67.0 in | Wt 246.0 lb

## 2017-12-06 DIAGNOSIS — M25562 Pain in left knee: Secondary | ICD-10-CM | POA: Diagnosis not present

## 2017-12-06 NOTE — Progress Notes (Signed)
Office Visit Note   Patient: Kathryn Delgado           Date of Birth: [birth year]           MRN: [id number] Visit Date: 12/06/2017              Requested by: Ronnald NianLalonde, John C, MD [address] [place], [place] [id number] PCP: Avanell ShackletonHenson, Vickie L, NP-C   Assessment & Plan: Visit Diagnoses:  1. Acute pain of left knee     Plan: Several month history of insidious onset left knee pain.  Poor response to aspiration and injection of cortisone x2 per her primary care physician.  X-rays are consistent with osteoarthritis particularly in the lateral and patellofemoral compartments.  I think it is worth obtaining an MRI scan because of her poor response to time medicine and injections.  The aspirate in March revealed no crystals. Consider further lab if MRI scan is nondiagnostic Follow-Up Instructions: Return after MRI left knee.   Orders:  Orders Placed This Encounter  Procedures  . MR Knee Left w/o contrast   No orders of the defined types were placed in this encounter.     Procedures: No procedures performed   Clinical Data: No additional findings.   Subjective: Chief Complaint  Patient presents with  . New Patient (Initial Visit)    L KNEE PAIN 3- 4 MO NO INJURY, SEEN DR DREW FLUID OFF NO GOUT, HAD STEROID INJECTIONLAST INJECTION WAS 10/2017 HELPED FOR A WHILE BUT SYMPOMS ARE BACK  Kathryn Delgado is a 52 years old and visited the office with a several month history of insidious onset left knee pain.  She has had several jobs recently where she is on her feet and she thinks it may have something to do with her pain but no obvious injury or trauma.  She is had recurrent effusions.  Her primary care physician is aspirated her knee on 2 occasions injection cortisone.  Each injection lasted only "several weeks".  She denies any other joint complaints.  Any skin rashes.  Reviewed the results of the aspirate without evidence of crystals.  There was no increase in white cells and some  infection. I did review the films of her left knee on the PACS system.  There was no evidence of acute change.  There are arthritic changes in the lateral compartment with peripheral osteophytes and some subchondral sclerosis. the joint space is well-maintained.  Also noted are degenerative changes the patellofemoral joint. HPI  Review of Systems  Constitutional: Negative for fatigue and fever.  HENT: Negative for ear pain.   Eyes: Negative for pain.  Respiratory: Negative for cough and shortness of breath.   Cardiovascular: Positive for leg swelling.  Gastrointestinal: Negative for constipation and diarrhea.  Genitourinary: Negative for difficulty urinating.  Musculoskeletal: Positive for back pain. Negative for neck pain.  Skin: Negative for rash.  Allergic/Immunologic: Negative for food allergies.  Neurological: Positive for weakness and numbness.  Hematological: Does not bruise/bleed easily.  Psychiatric/Behavioral: Negative for sleep disturbance.     Objective: Vital Signs: BP (!) 145/84 (BP Location: Right Arm, Patient Position: Sitting, Cuff Size: Normal)   Pulse 82   Ht 5\' 7"  (1.702 m)   Wt 246 lb (111.6 kg)   BMI 38.53 kg/m   Physical Exam  Constitutional: She is oriented to person, place, and time. She appears well-developed and well-nourished.  HENT:  Mouth/Throat: Oropharynx is clear and moist.  Eyes: Pupils are equal, round, and reactive to light. EOM  are normal.  Pulmonary/Chest: Effort normal.  Neurological: She is alert and oriented to person, place, and time.  Skin: Skin is warm and dry.  Psychiatric: She has a normal mood and affect. Her behavior is normal.    Ortho Exam awake alert and oriented x3.  Comfortable sitting.  Positive effusion left knee.  Left knee was not particularly warm.  Predominately anterolateral joint pain that was mild to moderate.  Some patellar crepitation.  No pain medially.  Popping grating in the medial lateral compartments.  No  popliteal fullness.  Full extension flexion over 105 degrees without instability.  No calf pain.  No swelling distally.  Right leg raise negative.  Painless range of motion left hip  Specialty Comments:  No specialty comments available.  Imaging: No results found.   PMFS History: There are no active problems to display for this patient.  Past Medical History:  Diagnosis Date  . Hypertension     Family History  Problem Relation Age of Onset  . Diabetes Mother   . Thyroid disease Sister   . Diabetes Sister   . Thyroid disease Sister   . Diabetes Brother     Past Surgical History:  Procedure Laterality Date  . FOOT SURGERY  2005  . FOOT SURGERY Left 2009   Social History   Occupational History  . Not on file  Tobacco Use  . Smoking status: Current Every Day Smoker    Packs/day: 0.50    Years: 40.00    Pack years: 20.00  . Smokeless tobacco: Never Used  Substance and Sexual Activity  . Alcohol use: Yes    Alcohol/week: 1.2 oz    Types: 2 Cans of beer per week    Comment: socially  . Drug use: No    Comment: former cocaine user   . Sexual activity: Yes    Partners: Female

## 2017-12-16 ENCOUNTER — Ambulatory Visit
Admission: RE | Admit: 2017-12-16 | Discharge: 2017-12-16 | Disposition: A | Discharge status: Home / Self Care | Payer: 59 | Source: Ambulatory Visit | Attending: Orthopaedic Surgery | Admitting: Orthopaedic Surgery

## 2017-12-16 DIAGNOSIS — M25562 Pain in left knee: Secondary | ICD-10-CM

## 2017-12-19 ENCOUNTER — Telehealth (INDEPENDENT_AMBULATORY_CARE_PROVIDER_SITE_OTHER): Payer: Self-pay | Admitting: Orthopaedic Surgery

## 2017-12-19 NOTE — Telephone Encounter (Signed)
Please call to precert visco left knee for OA

## 2017-12-19 NOTE — Telephone Encounter (Signed)
Please advise 

## 2017-12-19 NOTE — Telephone Encounter (Signed)
Patient called requesting she receive her MRI results by phone.

## 2017-12-20 ENCOUNTER — Ambulatory Visit (INDEPENDENT_AMBULATORY_CARE_PROVIDER_SITE_OTHER): Payer: 59 | Admitting: Orthopaedic Surgery

## 2017-12-20 NOTE — Telephone Encounter (Signed)
Noted  

## 2017-12-20 NOTE — Telephone Encounter (Signed)
Please apply for visco supp for l knee osteoarthiritis.

## 2017-12-22 ENCOUNTER — Telehealth (INDEPENDENT_AMBULATORY_CARE_PROVIDER_SITE_OTHER): Payer: Self-pay

## 2017-12-22 NOTE — Telephone Encounter (Signed)
Submitted application online for Monovisc, left knee. 

## 2017-12-29 ENCOUNTER — Telehealth (INDEPENDENT_AMBULATORY_CARE_PROVIDER_SITE_OTHER): Payer: Self-pay

## 2017-12-29 NOTE — Telephone Encounter (Signed)
Submitted Rx form through [facility] portal to obtain through [facility] under medical benefits for Monovisc, left knee.

## 2018-01-04 ENCOUNTER — Telehealth (INDEPENDENT_AMBULATORY_CARE_PROVIDER_SITE_OTHER): Payer: Self-pay

## 2018-01-04 NOTE — Telephone Encounter (Signed)
Faxed [facility] Enrollment Form to [phone].

## 2018-01-06 ENCOUNTER — Telehealth (INDEPENDENT_AMBULATORY_CARE_PROVIDER_SITE_OTHER): Payer: Self-pay

## 2018-01-06 NOTE — Telephone Encounter (Signed)
Faxed Rx for Monovisc injection, left knee to [facility] at [phone].

## 2018-02-07 ENCOUNTER — Telehealth: Payer: Self-pay | Admitting: Family Medicine

## 2018-02-07 NOTE — Telephone Encounter (Signed)
Partner Sonji called with patient beside her.  States patient still having lots of pain & swelling with knee & limping.  They do not understand why she can't get any help or medication for this.  Says provider hasn't followed up with her.  States not getting resolution or follow up.  Says paying co pays and lot of money for MRI and still no better.  I advised would have office manager call her back, she will be at work and would like call back to patient.

## 2018-02-07 NOTE — Telephone Encounter (Signed)
I called patient and she is very frustratred, her and Sonji both got on speaker phone at same time talking to me.  They said the ortho office told them that Dr. Susann Givens was going to handle from this point.  I explained that is not normally how it works that I would call over to ortho and find out for her.  Called April Jackson RMA for Dr. Lessie Dings.  They have been waiting since Aug 2 for approval on an injection.  She will call pharm and then patient and give status.  Called pt back t/w Nyima, explained what I was told.  She asked how much the injection would be.  I advised no idea. But to ask about patient assistance or grant monies for the vaccine if too expensive.  Delema will advise if she has further need from [place].

## 2018-02-08 ENCOUNTER — Telehealth (INDEPENDENT_AMBULATORY_CARE_PROVIDER_SITE_OTHER): Payer: Self-pay

## 2018-02-08 NOTE — Telephone Encounter (Signed)
Talked with [facility] this morning and was advised that patient has been approved for Monovisc, left knee and that they had tried calling patient for her consent to have medication delivered to our office.  Approved since 01/16/2018.  Talked with patient and advised her of message above.  Provided patient with [facility] phone number to give her consent to have Rx delivered to our office.  [phone].  Patient stated that she will contact [facility].

## 2018-02-09 ENCOUNTER — Telehealth (INDEPENDENT_AMBULATORY_CARE_PROVIDER_SITE_OTHER): Payer: Self-pay

## 2018-02-09 NOTE — Telephone Encounter (Signed)
Talked with patient concerning Monovisc,left knee.  Patient stated that gel injection is to expensive to obtain through the [facility].  Advised patient that I will submit VOB for SynviscOne, left knee and give her call once VOB are available.  Submitted.

## 2018-02-10 ENCOUNTER — Telehealth (INDEPENDENT_AMBULATORY_CARE_PROVIDER_SITE_OTHER): Payer: Self-pay

## 2018-02-10 NOTE — Telephone Encounter (Signed)
LMOM for patient to call and schedule injection.

## 2018-02-10 NOTE — Telephone Encounter (Signed)
Talked with patient and advised her that she is approved for SynviscOne, left knee. Buy & Bill Covered at 80% after the deductible has been met. Patient will be responsible for 20% OOP. No co-pay No PA required.  Please schedule patient appointment with Dr. Whitfield.  Thank You. 

## 2018-02-13 ENCOUNTER — Encounter (INDEPENDENT_AMBULATORY_CARE_PROVIDER_SITE_OTHER): Payer: Self-pay

## 2018-02-13 ENCOUNTER — Ambulatory Visit (INDEPENDENT_AMBULATORY_CARE_PROVIDER_SITE_OTHER): Payer: 59 | Admitting: Orthopaedic Surgery

## 2018-02-13 ENCOUNTER — Encounter (INDEPENDENT_AMBULATORY_CARE_PROVIDER_SITE_OTHER): Payer: Self-pay | Admitting: Orthopaedic Surgery

## 2018-02-13 VITALS — BP 135/91 | HR 80 | Ht 67.0 in | Wt 252.0 lb

## 2018-02-13 DIAGNOSIS — G8929 Other chronic pain: Secondary | ICD-10-CM | POA: Diagnosis not present

## 2018-02-13 DIAGNOSIS — M25562 Pain in left knee: Secondary | ICD-10-CM

## 2018-02-13 MED ORDER — LIDOCAINE HCL 1 % IJ SOLN
2.0000 mL | INTRAMUSCULAR | Status: AC | PRN
  Administered 2018-02-13: 2 mL

## 2018-02-13 MED ORDER — BUPIVACAINE HCL 0.5 % IJ SOLN
2.0000 mL | INTRAMUSCULAR | Status: AC | PRN
  Administered 2018-02-13: 2 mL via INTRA_ARTICULAR

## 2018-02-13 MED ORDER — METHYLPREDNISOLONE ACETATE 40 MG/ML IJ SUSP
80.0000 mg | INTRAMUSCULAR | Status: AC | PRN
  Administered 2018-02-13: 80 mg

## 2018-02-13 NOTE — Progress Notes (Signed)
Office Visit Note   Patient: Kathryn Delgado           Date of Birth: December 16, 1965           MRN: [id number] Visit Date: 02/13/2018              Requested by: Avanell Shackleton, NP-C [address] [place], [place] [address] PCP: Avanell Shackleton, NP-C   Assessment & Plan: Visit Diagnoses:  1. Chronic pain of left knee     Plan: Recurrent pain and effusion left knee.  Prior MRI scan in July treated osteoarthritis of the lateral compartment and multiple areas of cartilage loss as well as some degenerative changes of the patellofemoral and medial compartments.  We will plan on aspirating the knee, injected cortisone and centering Visco supplementation in 2 weeks  Follow-Up Instructions: Return in about 2 weeks (around 02/27/2018).   Orders:  Orders Placed This Encounter  Procedures  . Large Joint Inj: L knee   No orders of the defined types were placed in this encounter.     Procedures: Large Joint Inj: L knee on 02/13/2018 3:57 PM Indications: pain and diagnostic evaluation Details: 25 G 1.5 in needle, anteromedial approach  Arthrogram: No  Medications: 2 mL lidocaine 1 %; 2 mL bupivacaine 0.5 %; 80 mg methylPREDNISolone acetate 40 MG/ML Aspirate: 93 mL clear and yellow Outcome: tolerated well, no immediate complications Procedure, treatment alternatives, risks and benefits explained, specific risks discussed. Consent was given by the patient. Patient was prepped and draped in the usual sterile fashion.       Clinical Data: No additional findings.   Subjective: Chief Complaint  Patient presents with  . Follow-up    L KNEE PAIN, HAS FLUID BUILD UP AND  NOW HAVING POPPING WITH PAIN LAST FEW WEEKS. WOULD LIKE FLUID REMOVED   Chronic pain left knee that appears to be on the basis of osteoarthritis.  MRI scan July the lateral compartment but also medial compartment.  A small.  Superior surface tear of the anterior horn of the lateral meniscus.  Now having a recurrence  effusion and pain to the point of compromise.  Apparently she has been approved for Visco supplementation  HPI  Review of Systems  Constitutional: Negative for fatigue and fever.  HENT: Negative for ear pain.   Eyes: Negative for pain.  Respiratory: Negative for cough and choking.   Cardiovascular: Positive for leg swelling.  Gastrointestinal: Negative for constipation and diarrhea.  Genitourinary: Negative for difficulty urinating.  Musculoskeletal: Negative for back pain and neck pain.  Skin: Negative for rash.  Allergic/Immunologic: Negative for food allergies.  Neurological: Positive for weakness and numbness.  Hematological: Does not bruise/bleed easily.  Psychiatric/Behavioral: Positive for sleep disturbance.     Objective: Vital Signs: BP (!) 135/91 (BP Location: Left Arm, Patient Position: Sitting, Cuff Size: Normal)   Pulse 80   Ht 5\' 7"  (1.702 m)   Wt 252 lb (114.3 kg)   BMI 39.47 kg/m   Physical Exam  Constitutional: She is oriented to person, place, and time. She appears well-developed and well-nourished.  HENT:  Mouth/Throat: Oropharynx is clear and moist.  Eyes: Pupils are equal, round, and reactive to light. EOM are normal.  Pulmonary/Chest: Effort normal.  Neurological: She is alert and oriented to person, place, and time.  Skin: Skin is warm and dry.  Psychiatric: She has a normal mood and affect. Her behavior is normal.    Ortho Exam left knee with large effusion predominantly lateral  joint pain.  Limited range of motion based on effusion.  I aspirated 93 cc of clear yellow considerable relief of her pain  Specialty Comments:  No specialty comments available.  Imaging: No results found.   PMFS History: There are no active problems to display for this patient.  Past Medical History:  Diagnosis Date  . Hypertension     Family History  Problem Relation Age of Onset  . Diabetes Mother   . Thyroid disease Sister   . Diabetes Sister   . Thyroid  disease Sister   . Diabetes Brother     Past Surgical History:  Procedure Laterality Date  . FOOT SURGERY  2005  . FOOT SURGERY Left 2009   Social History   Occupational History  . Not on file  Tobacco Use  . Smoking status: Current Every Day Smoker    Packs/day: 0.50    Years: 40.00    Pack years: 20.00  . Smokeless tobacco: Never Used  Substance and Sexual Activity  . Alcohol use: Yes    Alcohol/week: 2.0 standard drinks    Types: 2 Cans of beer per week    Comment: socially  . Drug use: No    Comment: former cocaine user   . Sexual activity: Yes    Partners: Female

## 2018-03-03 ENCOUNTER — Ambulatory Visit (INDEPENDENT_AMBULATORY_CARE_PROVIDER_SITE_OTHER): Payer: 59 | Admitting: Orthopaedic Surgery

## 2018-03-29 ENCOUNTER — Other Ambulatory Visit: Payer: Self-pay

## 2018-03-29 DIAGNOSIS — I10 Essential (primary) hypertension: Secondary | ICD-10-CM

## 2018-03-29 NOTE — Telephone Encounter (Signed)
Patient has called and requested a refill on the pended medication

## 2018-03-29 NOTE — Telephone Encounter (Signed)
Ok to give her 30 days and then looks like she needs an office visit. I have not seen her since March and this was our only visit.

## 2018-03-30 MED ORDER — LISINOPRIL-HYDROCHLOROTHIAZIDE 10-12.5 MG PO TABS: 1.0000 | ORAL_TABLET | Freq: Every day | ORAL | 0 refills | Status: DC

## 2018-03-30 NOTE — Telephone Encounter (Signed)
Pt called back and made an appt but refill has not been sent. Please send.

## 2018-03-30 NOTE — Telephone Encounter (Signed)
30 day has been sent. lmom asking patient to call back and schedule appt for further refills.

## 2018-03-30 NOTE — Telephone Encounter (Signed)
done

## 2018-04-10 ENCOUNTER — Encounter: Payer: 59 | Admitting: Family Medicine

## 2018-04-25 ENCOUNTER — Encounter: Payer: Self-pay | Admitting: Family Medicine

## 2018-04-25 ENCOUNTER — Ambulatory Visit (INDEPENDENT_AMBULATORY_CARE_PROVIDER_SITE_OTHER): Payer: 59 | Admitting: Family Medicine

## 2018-04-25 VITALS — BP 124/90 | HR 87 | Temp 98.0°F | Wt 246.6 lb

## 2018-04-25 DIAGNOSIS — M25562 Pain in left knee: Secondary | ICD-10-CM | POA: Diagnosis not present

## 2018-04-25 DIAGNOSIS — I10 Essential (primary) hypertension: Secondary | ICD-10-CM | POA: Diagnosis not present

## 2018-04-25 DIAGNOSIS — R740 Nonspecific elevation of levels of transaminase and lactic acid dehydrogenase [LDH]: Secondary | ICD-10-CM

## 2018-04-25 DIAGNOSIS — G8929 Other chronic pain: Secondary | ICD-10-CM | POA: Insufficient documentation

## 2018-04-25 DIAGNOSIS — R7401 Elevation of levels of liver transaminase levels: Secondary | ICD-10-CM

## 2018-04-25 DIAGNOSIS — F17200 Nicotine dependence, unspecified, uncomplicated: Secondary | ICD-10-CM | POA: Diagnosis not present

## 2018-04-25 DIAGNOSIS — Z87891 Personal history of nicotine dependence: Secondary | ICD-10-CM | POA: Insufficient documentation

## 2018-04-25 MED ORDER — MELOXICAM 15 MG PO TABS: 15.0000 mg | ORAL_TABLET | Freq: Every day | ORAL | 0 refills | Status: DC

## 2018-04-25 NOTE — Progress Notes (Signed)
   Subjective:    Patient ID: Kathryn Delgado, female    DOB: 19-Jun-1965, 52 y.o.   MRN: [id number]  HPI Chief Complaint  Patient presents with  . med check    med check, knee pain    She is here for a medication check to follow up on HTN.  States she did not take her medication today.   She is still smoking but wants to stop. Does not have a plan.   Diet is fairly healthy. Cut back on fried foods.   No exercise.   Continues to have chronic left knee pain and swelling. No locking or giving away.  She was seen by ortho, Dr. Cleophas DunkerWhitfield, for chronic knee pain. States she did not follow up due to finances.   Taking Aleve most days for knee pain.   Drinks alcohol, heavy at times.   Denies fever, chills, dizziness, chest pain, palpitations, shortness of breath, abdominal pain, N/V/D, urinary symptoms, LE edema.   Reviewed allergies, medications, past medical, surgical, family, and social history.    Review of Systems Pertinent positives and negatives in the history of present illness.     Objective:   Physical Exam  Constitutional: She is oriented to person, place, and time. She appears well-developed and well-nourished. No distress.  Eyes: Conjunctivae are normal.  Neck: Normal range of motion.  Cardiovascular: Normal rate and regular rhythm.  Pulmonary/Chest: Effort normal and breath sounds normal.  Musculoskeletal:       Left knee: She exhibits effusion. Tenderness found. Lateral joint line tenderness noted.       Right lower leg: Normal.       Left lower leg: Normal.  Negative Mcmurrays  Neurological: She is alert and oriented to person, place, and time.  Skin: Skin is warm and dry.   BP 124/90   Pulse 87   Temp 98 F (36.7 C) (Oral)   Wt 246 lb 9.6 oz (111.9 kg)   BMI 38.62 kg/m       Assessment & Plan:  Essential hypertension  Chronic pain of left knee - Plan: meloxicam (MOBIC) 15 MG tablet  Elevated ALT measurement - Plan: Hepatic Function Panel,  Hepatitis panel, acute  Smoker  Blood pressure is not quite in goal range however she did not take her medication today and she is in pain related to her knee.  She will continue on current medication regimen for now.  Advised her to keep a check of her blood pressure at home. DASH diet handout given and counseling done on healthy diet and exercise for weight loss. Discussed exercise that she can do such as bike or pool workouts due to chronic left knee pain. Discussed how weight loss can help improve blood pressure and knee pain. She will stop all over-the-counter pain medications and take once daily meloxicam with food.  Follow-up with her orthopedist. Discussed history of elevated liver enzymes.  She reports this has never been worked up.  She does drink alcohol and I encouraged her to stop. Recheck hepatic function and acute hepatitis panel. Smoking cessation done. Follow-up pending labs.

## 2018-04-25 NOTE — Patient Instructions (Signed)
Stop all over the counter pain medications and only take the once daily meloxicam with food.   Follow up with Dr. Cleophas Dunker for knee pain.   Stop smoking and alcohol.   Cut back on portion sizes, carbohydrates, sweets and calories in general. Try riding a bike or doing a pool workout for exercise.  Weight loss will help improve your knee pain.   We will call you with your lab results.    DASH Eating Plan DASH stands for "Dietary Approaches to Stop Hypertension." The DASH eating plan is a healthy eating plan that has been shown to reduce high blood pressure (hypertension). It may also reduce your risk for type 2 diabetes, heart disease, and stroke. The DASH eating plan may also help with weight loss. What are tips for following this plan? General guidelines  Avoid eating more than 2,300 mg (milligrams) of salt (sodium) a day. If you have hypertension, you may need to reduce your sodium intake to 1,500 mg a day.  Limit alcohol intake to no more than 1 drink a day for nonpregnant women and 2 drinks a day for men. One drink equals 12 oz of beer, 5 oz of wine, or 1 oz of hard liquor.  Work with your health care provider to maintain a healthy body weight or to lose weight. Ask what an ideal weight is for you.  Get at least 30 minutes of exercise that causes your heart to beat faster (aerobic exercise) most days of the week. Activities may include walking, swimming, or biking.  Work with your health care provider or diet and nutrition specialist (dietitian) to adjust your eating plan to your individual calorie needs. Reading food labels  Check food labels for the amount of sodium per serving. Choose foods with less than 5 percent of the Daily Value of sodium. Generally, foods with less than 300 mg of sodium per serving fit into this eating plan.  To find whole grains, look for the word "whole" as the first word in the ingredient list. Shopping  Buy products labeled as "low-sodium" or "no  salt added."  Buy fresh foods. Avoid canned foods and premade or frozen meals. Cooking  Avoid adding salt when cooking. Use salt-free seasonings or herbs instead of table salt or sea salt. Check with your health care provider or pharmacist before using salt substitutes.  Do not fry foods. Cook foods using healthy methods such as baking, boiling, grilling, and broiling instead.  Cook with heart-healthy oils, such as olive, canola, soybean, or sunflower oil. Meal planning   Eat a balanced diet that includes: ? 5 or more servings of fruits and vegetables each day. At each meal, try to fill half of your plate with fruits and vegetables. ? Up to 6-8 servings of whole grains each day. ? Less than 6 oz of lean meat, poultry, or fish each day. A 3-oz serving of meat is about the same size as a deck of cards. One egg equals 1 oz. ? 2 servings of low-fat dairy each day. ? A serving of nuts, seeds, or beans 5 times each week. ? Heart-healthy fats. Healthy fats called Omega-3 fatty acids are found in foods such as flaxseeds and coldwater fish, like sardines, salmon, and mackerel.  Limit how much you eat of the following: ? Canned or prepackaged foods. ? Food that is high in trans fat, such as fried foods. ? Food that is high in saturated fat, such as fatty meat. ? Sweets, desserts, sugary drinks, and  other foods with added sugar. ? Full-fat dairy products.  Do not salt foods before eating.  Try to eat at least 2 vegetarian meals each week.  Eat more home-cooked food and less restaurant, buffet, and fast food.  When eating at a restaurant, ask that your food be prepared with less salt or no salt, if possible. What foods are recommended? The items listed may not be a complete list. Talk with your dietitian about what dietary choices are best for you. Grains Whole-grain or whole-wheat bread. Whole-grain or whole-wheat pasta. Brown rice. [place]row. Bulgur. Whole-grain and low-sodium  cereals. Pita bread. Low-fat, low-sodium crackers. Whole-wheat flour tortillas. Vegetables Fresh or frozen vegetables (raw, steamed, roasted, or grilled). Low-sodium or reduced-sodium tomato and vegetable juice. Low-sodium or reduced-sodium tomato sauce and tomato paste. Low-sodium or reduced-sodium canned vegetables. Fruits All fresh, dried, or frozen fruit. Canned fruit in natural juice (without added sugar). Meat and other protein foods Skinless chicken or [place]. Ground chicken or [place]. Pork with fat trimmed off. Fish and seafood. Egg whites. Dried beans, peas, or lentils. Unsalted nuts, nut butters, and seeds. Unsalted canned beans. Lean cuts of beef with fat trimmed off. Low-sodium, lean deli meat. Dairy Low-fat (1%) or fat-free (skim) milk. Fat-free, low-fat, or reduced-fat cheeses. Nonfat, low-sodium ricotta or cottage cheese. Low-fat or nonfat yogurt. Low-fat, low-sodium cheese. Fats and oils Soft margarine without trans fats. Vegetable oil. Low-fat, reduced-fat, or light mayonnaise and salad dressings (reduced-sodium). Canola, safflower, olive, soybean, and sunflower oils. Avocado. Seasoning and other foods Herbs. Spices. Seasoning mixes without salt. Unsalted popcorn and pretzels. Fat-free sweets. What foods are not recommended? The items listed may not be a complete list. Talk with your dietitian about what dietary choices are best for you. Grains Baked goods made with fat, such as croissants, muffins, or some breads. Dry pasta or rice meal packs. Vegetables Creamed or fried vegetables. Vegetables in a cheese sauce. Regular canned vegetables (not low-sodium or reduced-sodium). Regular canned tomato sauce and paste (not low-sodium or reduced-sodium). Regular tomato and vegetable juice (not low-sodium or reduced-sodium). Angie Fava. Olives. Fruits Canned fruit in a light or heavy syrup. Fried fruit. Fruit in cream or butter sauce. Meat and other protein foods Fatty cuts of meat. Ribs.  Fried meat. Berniece Salines. Sausage. Bologna and other processed lunch meats. Salami. Fatback. Hotdogs. Bratwurst. Salted nuts and seeds. Canned beans with added salt. Canned or smoked fish. Whole eggs or egg yolks. Chicken or [place] with skin. Dairy Whole or 2% milk, cream, and half-and-half. Whole or full-fat cream cheese. Whole-fat or sweetened yogurt. Full-fat cheese. Nondairy creamers. Whipped toppings. Processed cheese and cheese spreads. Fats and oils Butter. Stick margarine. Lard. Shortening. Ghee. Bacon fat. Tropical oils, such as coconut, palm kernel, or palm oil. Seasoning and other foods Salted popcorn and pretzels. Onion salt, garlic salt, seasoned salt, table salt, and sea salt. Worcestershire sauce. Tartar sauce. Barbecue sauce. Teriyaki sauce. Soy sauce, including reduced-sodium. Steak sauce. Canned and packaged gravies. Fish sauce. Oyster sauce. Cocktail sauce. Horseradish that you find on the shelf. Ketchup. Mustard. Meat flavorings and tenderizers. Bouillon cubes. Hot sauce and Tabasco sauce. Premade or packaged marinades. Premade or packaged taco seasonings. Relishes. Regular salad dressings. Where to find more information:  National Heart, Lung, and [place]: https://wilson-eaton.com/  American Heart Association: www.heart.org Summary  The DASH eating plan is a healthy eating plan that has been shown to reduce high blood pressure (hypertension). It may also reduce your risk for type 2 diabetes, heart disease, and stroke.  With the DASH eating plan, you should limit salt (sodium) intake to 2,300 mg a day. If you have hypertension, you may need to reduce your sodium intake to 1,500 mg a day.  When on the DASH eating plan, aim to eat more fresh fruits and vegetables, whole grains, lean proteins, low-fat dairy, and heart-healthy fats.  Work with your health care provider or diet and nutrition specialist (dietitian) to adjust your eating plan to your individual calorie needs. This  information is not intended to replace advice given to you by your health care provider. Make sure you discuss any questions you have with your health care provider. Document Released: 05/13/2011 Document Revised: 05/17/2016 Document Reviewed: 05/17/2016 Elsevier Interactive Patient Education  2018 [employer]     Coping with Quitting Smoking Quitting smoking is a physical and mental challenge. You will face cravings, withdrawal symptoms, and temptation. Before quitting, work with your health care provider to make a plan that can help you cope. Preparation can help you quit and keep you from giving in. How can I cope with cravings? Cravings usually last for 5-10 minutes. If you get through it, the craving will pass. Consider taking the following actions to help you cope with cravings:  Keep your mouth busy: ? Chew sugar-free gum. ? Suck on hard candies or a straw. ? Brush your teeth.  Keep your hands and body busy: ? Immediately change to a different activity when you feel a craving. ? Squeeze or play with a ball. ? Do an activity or a hobby, like making bead jewelry, practicing needlepoint, or working with wood. ? Mix up your normal routine. ? Take a short exercise break. Go for a quick walk or run up and down stairs. ? Spend time in public places where smoking is not allowed.  Focus on doing something kind or helpful for someone else.  Call a friend or family member to talk during a craving.  Join a support group.  Call a quit line, such as 1-800-QUIT-NOW.  Talk with your health care provider about medicines that might help you cope with cravings and make quitting easier for you.  How can I deal with withdrawal symptoms? Your body may experience negative effects as it tries to get used to not having nicotine in the system. These effects are called withdrawal symptoms. They may include:  Feeling hungrier than normal.  Trouble concentrating.  Irritability.  Trouble  sleeping.  Feeling depressed.  Restlessness and agitation.  Craving a cigarette.  To manage withdrawal symptoms:  Avoid places, people, and activities that trigger your cravings.  Remember why you want to quit.  Get plenty of sleep.  Avoid coffee and other caffeinated drinks. These may worsen some of your symptoms.  How can I handle social situations? Social situations can be difficult when you are quitting smoking, especially in the first few weeks. To manage this, you can:  Avoid parties, bars, and other social situations where people might be smoking.  Avoid alcohol.  Leave right away if you have the urge to smoke.  Explain to your family and friends that you are quitting smoking. Ask for understanding and support.  Plan activities with friends or family where smoking is not an option.  What are some ways I can cope with stress? Wanting to smoke may cause stress, and stress can make you want to smoke. Find ways to manage your stress. Relaxation techniques can help. For example:  Breathe slowly and deeply, in through your nose and  out through your mouth.  Listen to soothing, relaxing music.  Talk with a family member or friend about your stress.  Light a candle.  Soak in a bath or take a shower.  Think about a peaceful place.  What are some ways I can prevent weight gain? Be aware that many people gain weight after they quit smoking. However, not everyone does. To keep from gaining weight, have a plan in place before you quit and stick to the plan after you quit. Your plan should include:  Having healthy snacks. When you have a craving, it may help to: ? Eat plain popcorn, crunchy carrots, celery, or other cut vegetables. ? Chew sugar-free gum.  Changing how you eat: ? Eat small portion sizes at meals. ? Eat 4-6 small meals throughout the day instead of 1-2 large meals a day. ? Be mindful when you eat. Do not watch television or do other things that might  distract you as you eat.  Exercising regularly: ? Make time to exercise each day. If you do not have time for a long workout, do short bouts of exercise for 5-10 minutes several times a day. ? Do some form of strengthening exercise, like weight lifting, and some form of aerobic exercise, like running or swimming.  Drinking plenty of water or other low-calorie or no-calorie drinks. Drink 6-8 glasses of water daily, or as much as instructed by your health care provider.  Summary  Quitting smoking is a physical and mental challenge. You will face cravings, withdrawal symptoms, and temptation to smoke again. Preparation can help you as you go through these challenges.  You can cope with cravings by keeping your mouth busy (such as by chewing gum), keeping your body and hands busy, and making calls to family, friends, or a helpline for people who want to quit smoking.  You can cope with withdrawal symptoms by avoiding places where people smoke, avoiding drinks with caffeine, and getting plenty of rest.  Ask your health care provider about the different ways to prevent weight gain, avoid stress, and handle social situations. This information is not intended to replace advice given to you by your health care provider. Make sure you discuss any questions you have with your health care provider. Document Released: 05/21/2016 Document Revised: 05/21/2016 Document Reviewed: 05/21/2016 Elsevier Interactive Patient Education  [employer].

## 2018-04-26 LAB — HEPATITIS PANEL, ACUTE
HEP B S AG: NEGATIVE
Hep A IgM: NEGATIVE
Hep B C IgM: NEGATIVE
Hep C Virus Ab: <0.1 s/co ratio (ref 0.0–0.9)

## 2018-04-26 LAB — HEPATIC FUNCTION PANEL
ALBUMIN: 4.4 g/dL (ref 3.5–5.5)
ALT: 41 IU/L — ABNORMAL HIGH (ref 0–32)
AST: 24 IU/L (ref 0–40)
Alkaline Phosphatase: 61 IU/L (ref 39–117)
BILIRUBIN TOTAL: 0.5 mg/dL (ref 0.0–1.2)
Bilirubin, Direct: 0.16 mg/dL (ref 0.00–0.40)
TOTAL PROTEIN: 6.9 g/dL (ref 6.0–8.5)

## 2018-04-27 ENCOUNTER — Other Ambulatory Visit: Payer: Self-pay | Admitting: Internal Medicine

## 2018-04-27 DIAGNOSIS — R748 Abnormal levels of other serum enzymes: Secondary | ICD-10-CM

## 2018-05-01 ENCOUNTER — Other Ambulatory Visit: Payer: Self-pay | Admitting: Family Medicine

## 2018-05-01 DIAGNOSIS — I10 Essential (primary) hypertension: Secondary | ICD-10-CM

## 2018-05-08 ENCOUNTER — Ambulatory Visit
Admission: RE | Admit: 2018-05-08 | Discharge: 2018-05-08 | Disposition: A | Discharge status: Home / Self Care | Payer: 59 | Source: Ambulatory Visit | Attending: Family Medicine | Admitting: Family Medicine

## 2018-05-08 ENCOUNTER — Encounter: Payer: Self-pay | Admitting: Family Medicine

## 2018-05-08 DIAGNOSIS — R748 Abnormal levels of other serum enzymes: Secondary | ICD-10-CM

## 2018-05-08 DIAGNOSIS — K8020 Calculus of gallbladder without cholecystitis without obstruction: Secondary | ICD-10-CM

## 2018-05-08 HISTORY — DX: Calculus of gallbladder without cholecystitis without obstruction: K80.20

## 2018-05-22 ENCOUNTER — Other Ambulatory Visit: Payer: Self-pay | Admitting: Family Medicine

## 2018-05-22 DIAGNOSIS — M25562 Pain in left knee: Principal | ICD-10-CM

## 2018-05-22 DIAGNOSIS — G8929 Other chronic pain: Secondary | ICD-10-CM

## 2018-05-22 NOTE — Telephone Encounter (Signed)
Pt was advised that she needs to follow-up with her ortho doctor for this. She was calling today to get an appt scheduled but can we refill this for 30 days for her

## 2018-06-25 ENCOUNTER — Other Ambulatory Visit: Payer: Self-pay | Admitting: Family Medicine

## 2018-06-25 DIAGNOSIS — M25562 Pain in left knee: Principal | ICD-10-CM

## 2018-06-25 DIAGNOSIS — G8929 Other chronic pain: Secondary | ICD-10-CM

## 2018-06-26 NOTE — Telephone Encounter (Signed)
Pt was advised last month to call ortho

## 2018-08-06 HISTORY — PX: KNEE ARTHROCENTESIS: SUR44

## 2018-11-23 ENCOUNTER — Other Ambulatory Visit: Payer: Self-pay | Admitting: Family Medicine

## 2018-11-23 DIAGNOSIS — I10 Essential (primary) hypertension: Secondary | ICD-10-CM

## 2018-12-20 ENCOUNTER — Telehealth: Payer: Self-pay | Admitting: Family Medicine

## 2018-12-20 ENCOUNTER — Ambulatory Visit (INDEPENDENT_AMBULATORY_CARE_PROVIDER_SITE_OTHER): Payer: No Typology Code available for payment source | Admitting: Family Medicine

## 2018-12-20 ENCOUNTER — Encounter: Payer: Self-pay | Admitting: Family Medicine

## 2018-12-20 ENCOUNTER — Other Ambulatory Visit: Payer: Self-pay

## 2018-12-20 VITALS — Wt 252.0 lb

## 2018-12-20 DIAGNOSIS — K219 Gastro-esophageal reflux disease without esophagitis: Secondary | ICD-10-CM | POA: Diagnosis not present

## 2018-12-20 DIAGNOSIS — F17200 Nicotine dependence, unspecified, uncomplicated: Secondary | ICD-10-CM

## 2018-12-20 DIAGNOSIS — I10 Essential (primary) hypertension: Secondary | ICD-10-CM | POA: Diagnosis not present

## 2018-12-20 DIAGNOSIS — Z789 Other specified health status: Secondary | ICD-10-CM

## 2018-12-20 HISTORY — DX: Gastro-esophageal reflux disease without esophagitis: K21.9

## 2018-12-20 NOTE — Telephone Encounter (Signed)
Pt being seen today

## 2018-12-20 NOTE — Progress Notes (Signed)
Subjective:   Documentation for virtual audio and video telecommunications through [employer] [facility] encounter:  The patient was located at home. 2 patient identifiers used.  The provider was located at home.  The patient did consent to this visit and is aware of possible charges through their insurance for this visit.  The other persons participating in this telemedicine service were none.    Patient ID: Kathryn Delgado, female    DOB: May 01, 1966, 53 y.o.   MRN: [id number]  HPI Chief Complaint  Patient presents with  . discuss med    her bp med is causing heartburn. she last refilled med 2 weeks ago and thats when it started. has not taking med today and no heartburn. color of pill changed so not sure if that had something to do with it   Complains of a 2 week history of heartburn. States symptoms started after her blood pressure medication was switched from a blue pill to a peach one. She thinks this is causing her reflux.  States she did not take her medication today and she has not had reflux.  She does admit to eating more spicy foods lately.  Is also been drinking alcohol heavily over the past couple of weeks.  She is a smoker. Denies fever, chills, dizziness, chest pain, palpitations, shortness of breath, abdominal pain, N/V/D, LE edema.   States she has not been checking her BP at home.   States she has been taking Pepcid, Tums.   States she stopped the meloxicam for chronic knee pain. She has been to her orthopedist and taking aleve some days, also getting "gel shots".   Reviewed allergies, medications, past medical, surgical, family, and social history.    Review of Systems Pertinent positives and negatives in the history of present illness.     Objective:   Physical Exam Wt 252 lb (114.3 kg)   BMI 39.47 kg/m   Alert and oriented and in no acute distress.  Respirations unlabored.  Unable to examine otherwise due to this being a virtual visit.      Assessment & Plan:   Gastroesophageal reflux disease, esophagitis presence not specified - Plan: See note below  Essential hypertension - Plan: See note below  Smoker - Plan: Recommend that she stop smoking.  She does not have a plan to stop.  Alcohol consumption heavy - Plan: Encouraged her to stop alcohol use.  She has a history of elevated liver enzymes and this could be the reason.  Discussed limitations of a virtual visit. She is not in any acute distress and no red flag symptoms. Discussed that the pharmacy most likely had to get her medication from a different manufacturer and this is why it is a different color now.   She has not been checking her blood pressure at home so we are not sure if the medication is effective.  Strongly encouraged her to get a blood pressure cuff and start checking her blood pressure.    She is confident that she has not really had reflux today since she did not take her medication this morning.  She will stop the lisinopril HCTZ for the next 2 to 3 days and then start back.  We will see if her symptoms return.   I also counseled her on lifestyle management for reflux.  Discussed that her alcohol use, smoking and eating habits which now include spicy food could be contributing to her symptoms and that in fact it may not be her blood pressure medication  at all.   We will follow-up next week.  Time spent on call was 15 minutes and in review of previous records 2 minutes total.  This virtual service is not related to other E/M service within previous 7 days.

## 2018-12-20 NOTE — Telephone Encounter (Signed)
Needs virtual visit 

## 2018-12-20 NOTE — Telephone Encounter (Signed)
Pt called and states that she is taking lisinopril-hctz and they switched the color of the pill from blue to a round pink color and ever since that she has had the worse heartburn and she has tried tums Pepcid and noting is working she is wanting to know what she can do for that, pt can be reached at [phone]

## 2018-12-26 ENCOUNTER — Ambulatory Visit (INDEPENDENT_AMBULATORY_CARE_PROVIDER_SITE_OTHER): Payer: No Typology Code available for payment source | Admitting: Family Medicine

## 2018-12-26 ENCOUNTER — Encounter: Payer: Self-pay | Admitting: Family Medicine

## 2018-12-26 ENCOUNTER — Other Ambulatory Visit: Payer: Self-pay

## 2018-12-26 VITALS — BP 123/68 | HR 85 | Wt 252.0 lb

## 2018-12-26 DIAGNOSIS — K219 Gastro-esophageal reflux disease without esophagitis: Secondary | ICD-10-CM | POA: Diagnosis not present

## 2018-12-26 DIAGNOSIS — I10 Essential (primary) hypertension: Secondary | ICD-10-CM

## 2018-12-26 MED ORDER — LOSARTAN POTASSIUM-HCTZ 50-12.5 MG PO TABS: 1.0000 | ORAL_TABLET | Freq: Every day | ORAL | 2 refills | Status: DC

## 2018-12-26 NOTE — Progress Notes (Signed)
   Subjective:   Documentation for virtual audio and video telecommunications through [place] encounter:  The patient was located at home. 2 patient identifiers used.  The provider was located at home.  The patient did consent to this visit and is aware of possible charges through their insurance for this visit.  The other persons participating in this telemedicine service were none.    Patient ID: Kathryn Delgado, female    DOB: August 06, 1965, 53 y.o.   MRN: [id number]  HPI Chief Complaint  Patient presents with  . bp and heartburn    bp back on 17th 123/78, and checked earlier today was 151/83. hasn't taken med since saturday,  heartburn has gotten better   This is a follow-up on hypertension and reflux.  States she was doing fine on the lisinopril HCTZ medication until the pharmacy changed the manufacturer and the pill changed colors.  States since then she has reflux when taking the medication.  She stopped taking it and the reflux went away then she started back on the medication and it returned.  She is currently not taking the medication again and needs to switch blood pressure medication.  She does have other triggers for reflux including smoking, alcohol use and spicy foods.  States she has made improvement in cutting back on those.  She takes Tums as needed.  Denies fever, chills, dizziness, chest pain, palpitations, shortness of breath, nausea, vomiting, lower extremity edema.   Review of Systems Pertinent positives and negatives in the history of present illness.     Objective:   Physical Exam BP 123/68   Pulse 85   Wt 252 lb (114.3 kg)   BMI 39.47 kg/m   Alert and oriented and in no acute distress.  Respirations unlabored.      Assessment & Plan:  Essential hypertension - Plan: I will switch her from the lisinopril HCTZ to losartan HCTZ and see how she does with this medication.  She will continue to keep an eye on her blood pressure and follow-up with me in 4 weeks  or sooner if needed.  Gastroesophageal reflux disease, esophagitis presence not specified - Plan: Since she feels that the recent switch of manufacturers for her blood pressure which has her taking a different color pill is causing reflux, I will switch her to a different medication and see how she does.  Once again I encouraged her to avoid alcohol, stop smoking and avoid food triggers that cause her reflux.  She will let me know if her symptoms are worsening   Time spent on call was 13 minutes and in review of previous records 2 minutes total.  This virtual service is not related to other E/M service within previous 7 days.

## 2019-03-16 ENCOUNTER — Ambulatory Visit: Payer: No Typology Code available for payment source | Admitting: Medical

## 2019-04-03 ENCOUNTER — Other Ambulatory Visit: Payer: Self-pay | Admitting: Family Medicine

## 2019-04-03 DIAGNOSIS — I10 Essential (primary) hypertension: Secondary | ICD-10-CM

## 2019-05-29 IMAGING — US US ABDOMEN LIMITED
1 series · 14 of 25 positions shown · non-contrast
Comparison: None.

CLINICAL DATA: Elevated liver function tests.

EXAM:
ULTRASOUND ABDOMEN LIMITED RIGHT UPPER QUADRANT

[Series 1: us abdomen limited · 0.20mm/px · 14 of 57 slices shown]
[im 1/57]
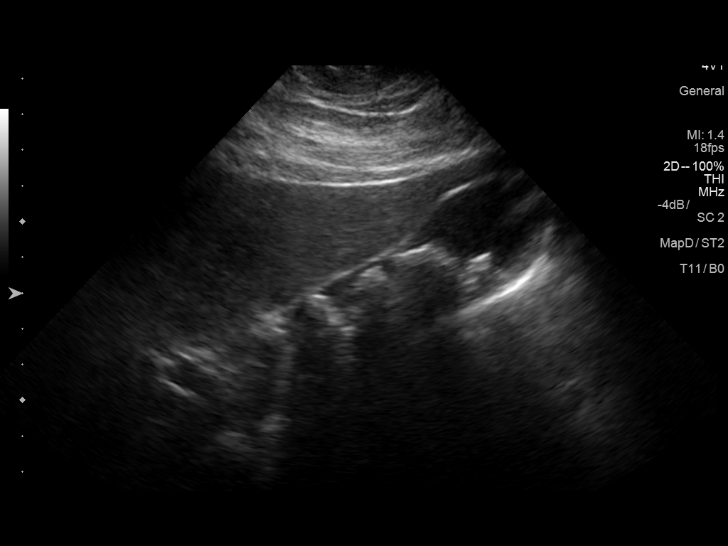
[im 5/57]
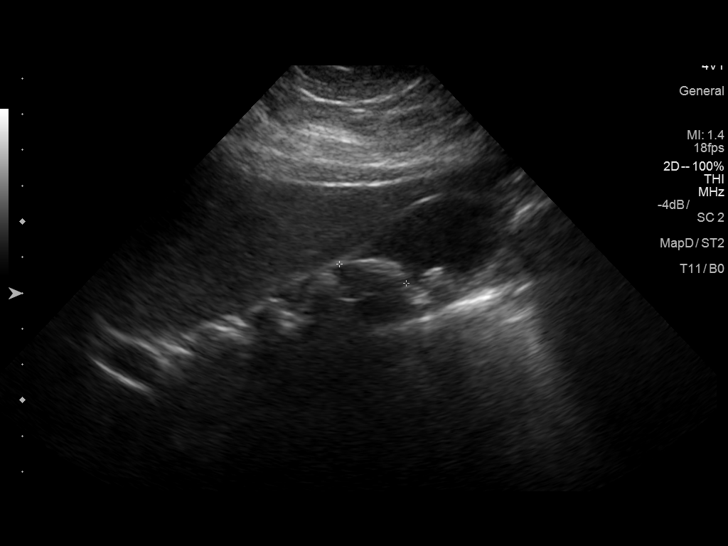
[im 10/57]
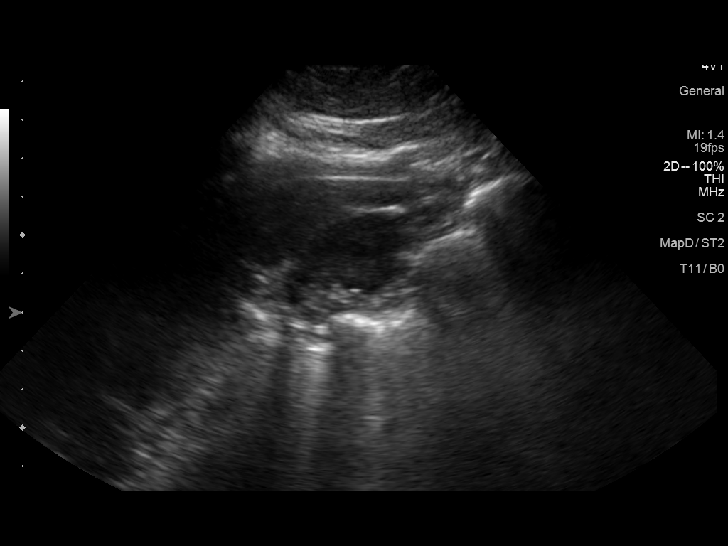
[im 15/57]
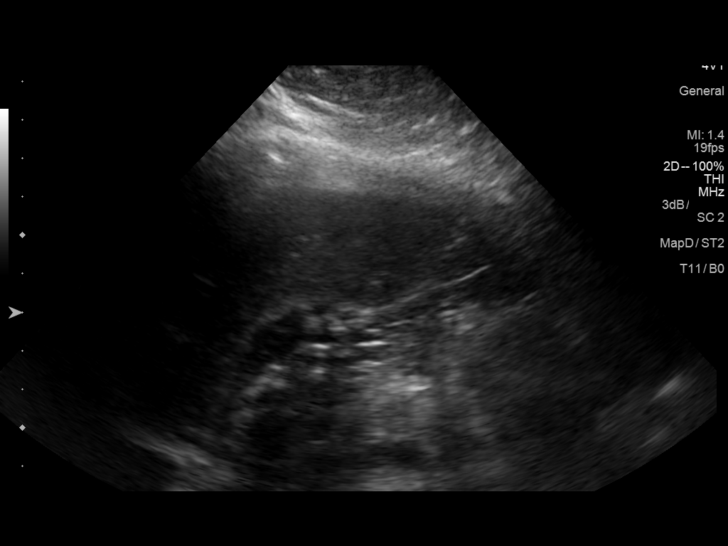
[im 19/57]
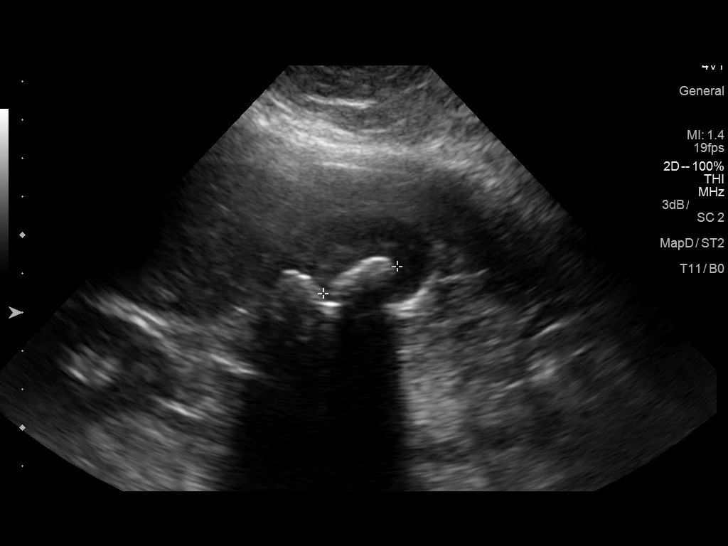
[im 22/57]
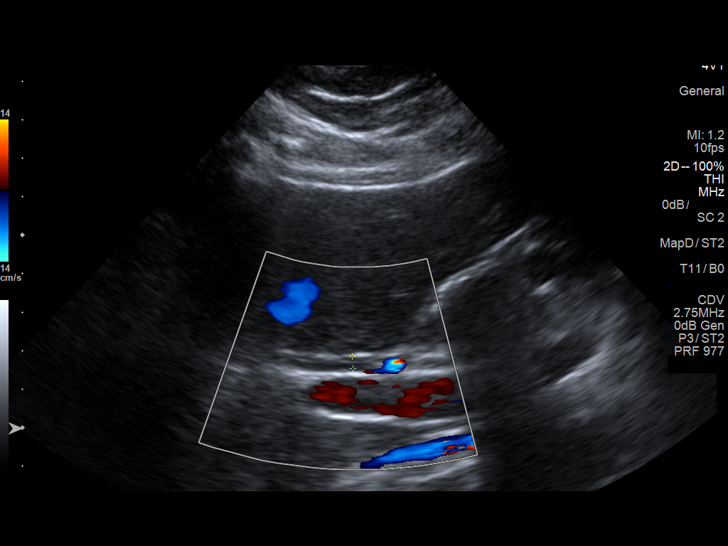
[im 26/57]
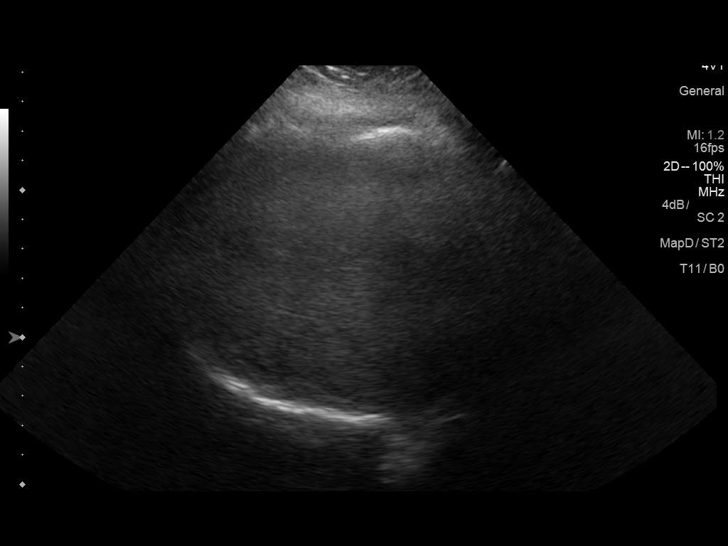
[im 31/57]
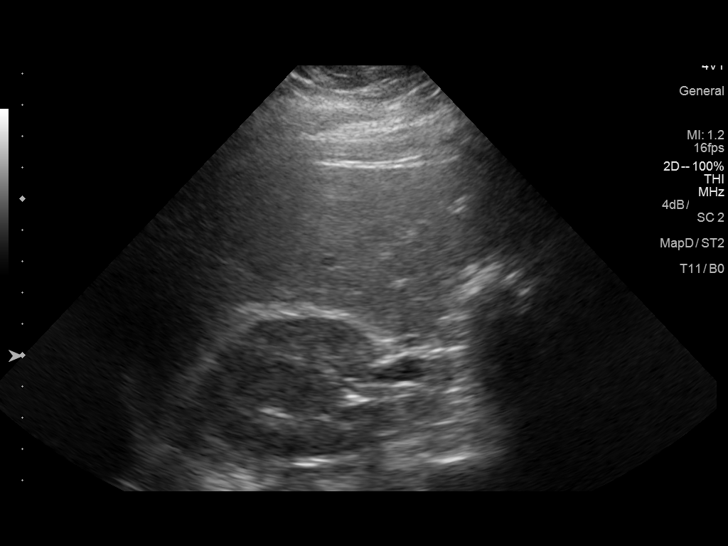
[im 36/57]
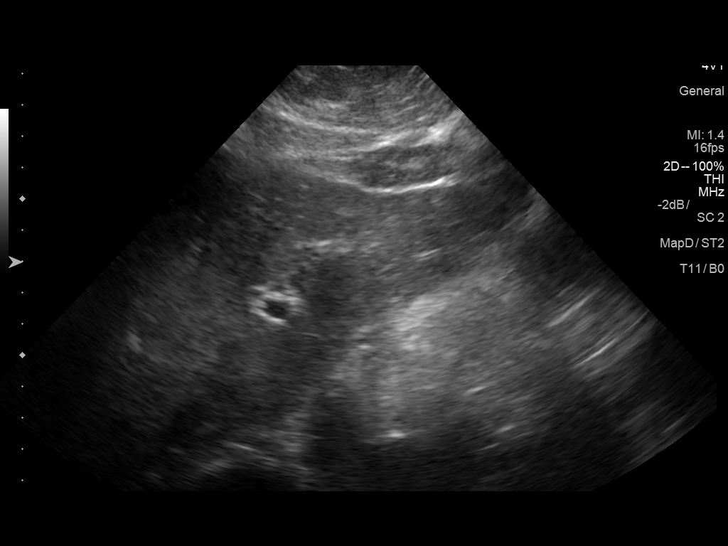
[im 38/57]
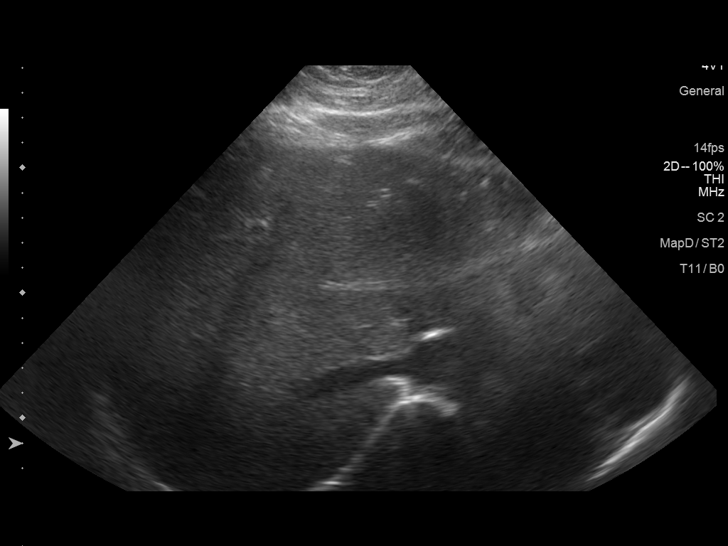
[im 43/57]
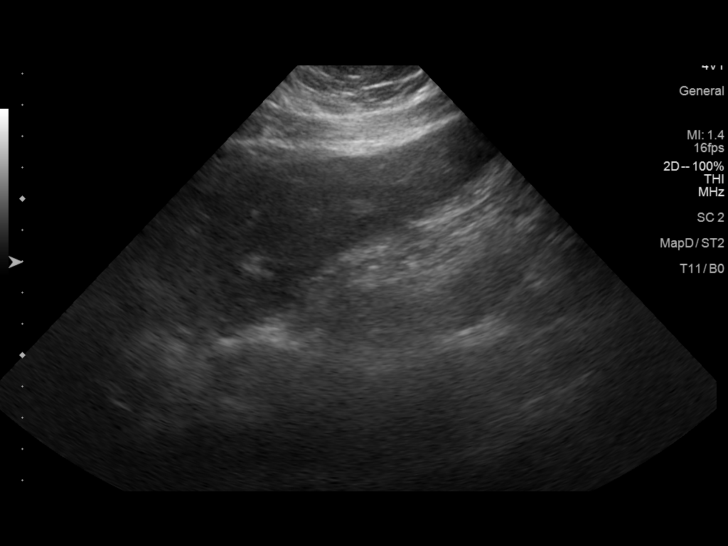
[im 47/57]
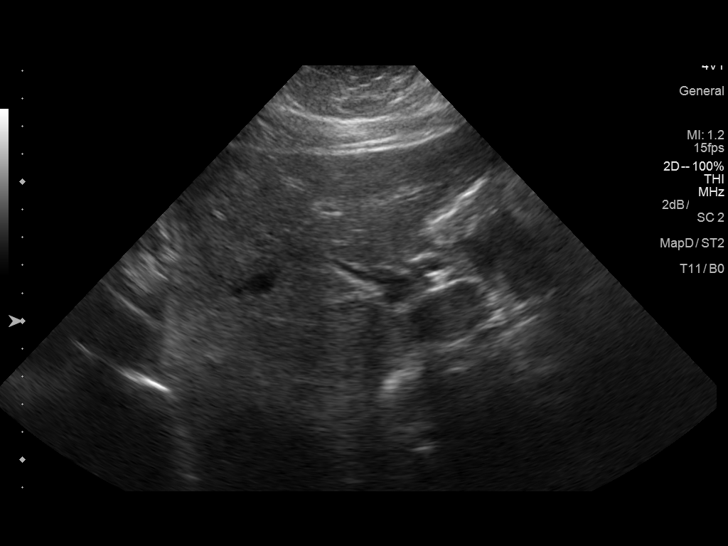
[im 52/57]
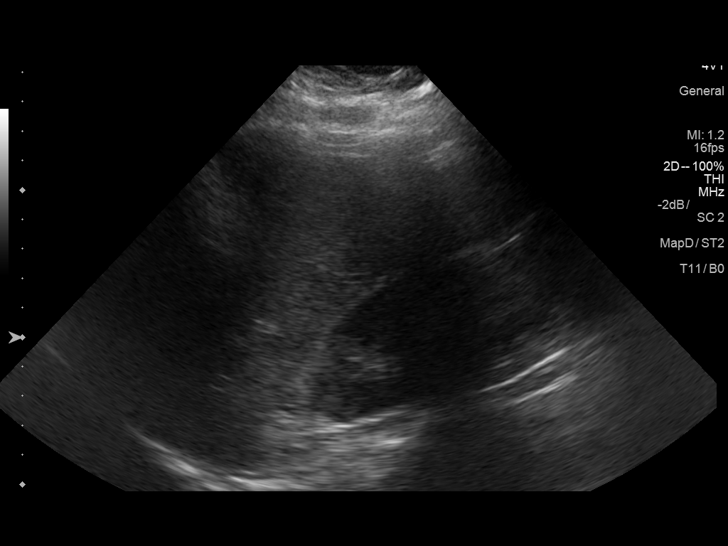
[im 57/57]
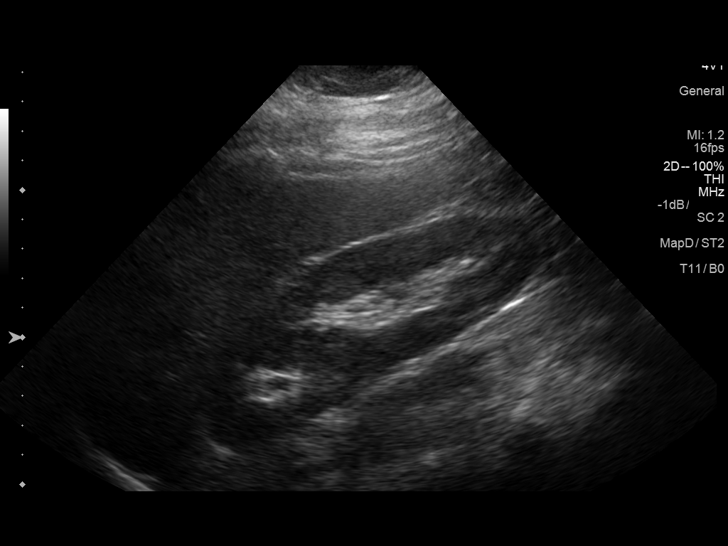

[14 of 25 positions shown; findings below may reference images not displayed]

FINDINGS: Gallbladder:

Multiple gallstones, largest measured stone is 2 cm greatest
dimension. No gallbladder wall thickening or pericholecystic fluid
demonstrated. No evidence of sonographic Murphy sign elicited during
the exam.

Common bile duct:

Diameter: Normal ranging from 3-5 mm.

Liver:

No focal lesion identified. Questionably echogenic raising the
possibility of fatty infiltration. Portal vein is patent on color
Doppler imaging with normal direction of blood flow towards the
liver.
IMPRESSION: 1. Cholelithiasis without evidence of acute cholecystitis.
2. No bile duct dilatation.
3. Liver is unremarkable. Questionable fatty infiltration of the
liver.

## 2019-10-18 ENCOUNTER — Other Ambulatory Visit: Payer: Self-pay

## 2019-10-18 ENCOUNTER — Emergency Department (HOSPITAL_COMMUNITY)
Admission: EM | Admit: 2019-10-18 | Discharge: 2019-10-18 | Disposition: A | Discharge status: Home / Self Care | Payer: Managed Care, Other (non HMO) | Attending: Emergency Medicine | Admitting: Emergency Medicine

## 2019-10-18 DIAGNOSIS — I10 Essential (primary) hypertension: Secondary | ICD-10-CM | POA: Diagnosis not present

## 2019-10-18 DIAGNOSIS — F17210 Nicotine dependence, cigarettes, uncomplicated: Secondary | ICD-10-CM | POA: Insufficient documentation

## 2019-10-18 DIAGNOSIS — R1084 Generalized abdominal pain: Secondary | ICD-10-CM

## 2019-10-18 DIAGNOSIS — Z79899 Other long term (current) drug therapy: Secondary | ICD-10-CM | POA: Insufficient documentation

## 2019-10-18 DIAGNOSIS — K219 Gastro-esophageal reflux disease without esophagitis: Secondary | ICD-10-CM | POA: Diagnosis not present

## 2019-10-18 LAB — CBC
HCT: 47 % — ABNORMAL HIGH (ref 36.0–46.0)
Hemoglobin: 15.4 g/dL — ABNORMAL HIGH (ref 12.0–15.0)
MCH: 28.4 pg (ref 26.0–34.0)
MCHC: 32.8 g/dL (ref 30.0–36.0)
MCV: 86.7 fL (ref 80.0–100.0)
Platelets: 275 10*3/uL (ref 150–400)
RBC: 5.42 MIL/uL — ABNORMAL HIGH (ref 3.87–5.11)
RDW: 13.5 % (ref 11.5–15.5)
WBC: 9.2 10*3/uL (ref 4.0–10.5)
nRBC: 0 % (ref 0.0–0.2)

## 2019-10-18 LAB — COMPREHENSIVE METABOLIC PANEL
ALT: 78 U/L — ABNORMAL HIGH (ref 0–44)
AST: 38 U/L (ref 15–41)
Albumin: 4.2 g/dL (ref 3.5–5.0)
Alkaline Phosphatase: 79 U/L (ref 38–126)
Anion gap: 12 (ref 5–15)
BUN: 22 mg/dL — ABNORMAL HIGH (ref 6–20)
CO2: 22 mmol/L (ref 22–32)
Calcium: 9.5 mg/dL (ref 8.9–10.3)
Chloride: 101 mmol/L (ref 98–111)
Creatinine, Ser: 0.78 mg/dL (ref 0.44–1.00)
GFR calc Af Amer: >60 mL/min (ref >60)
GFR calc non Af Amer: >60 mL/min (ref >60)
Glucose, Bld: 138 mg/dL — ABNORMAL HIGH (ref 70–99)
Potassium: 3.7 mmol/L (ref 3.5–5.1)
Sodium: 135 mmol/L (ref 135–145)
Total Bilirubin: 1.4 mg/dL — ABNORMAL HIGH (ref 0.3–1.2)
Total Protein: 7.9 g/dL (ref 6.5–8.1)

## 2019-10-18 LAB — LIPASE, BLOOD: Lipase: 27 U/L (ref 11–51)

## 2019-10-18 MED ORDER — SODIUM CHLORIDE 0.9 % IV BOLUS (SEPSIS)
1000.0000 mL | Freq: Once | INTRAVENOUS | Status: AC
  Administered 2019-10-18: 1000 mL via INTRAVENOUS

## 2019-10-18 MED ORDER — SODIUM CHLORIDE 0.9% FLUSH
3.0000 mL | Freq: Once | INTRAVENOUS | Status: AC
  Administered 2019-10-18: 3 mL via INTRAVENOUS

## 2019-10-18 MED ORDER — LIDOCAINE VISCOUS HCL 2 % MT SOLN
15.0000 mL | Freq: Once | OROMUCOSAL | Status: AC
  Administered 2019-10-18: 15 mL via ORAL
  Filled 2019-10-18: qty 15

## 2019-10-18 MED ORDER — ONDANSETRON HCL 4 MG/2ML IJ SOLN
4.0000 mg | Freq: Once | INTRAMUSCULAR | Status: AC
  Administered 2019-10-18: 4 mg via INTRAVENOUS
  Filled 2019-10-18: qty 2

## 2019-10-18 MED ORDER — PANTOPRAZOLE SODIUM 40 MG IV SOLR
40.0000 mg | Freq: Once | INTRAVENOUS | Status: AC
  Administered 2019-10-18: 40 mg via INTRAVENOUS
  Filled 2019-10-18: qty 40

## 2019-10-18 MED ORDER — PROMETHAZINE HCL 25 MG/ML IJ SOLN
12.5000 mg | Freq: Once | INTRAMUSCULAR | Status: AC
  Administered 2019-10-18: 12.5 mg via INTRAVENOUS
  Filled 2019-10-18: qty 1

## 2019-10-18 MED ORDER — PANTOPRAZOLE SODIUM 20 MG PO TBEC
20.0000 mg | DELAYED_RELEASE_TABLET | Freq: Every day | ORAL | 0 refills | Status: DC
Start: 2019-10-18 — End: 2019-12-06

## 2019-10-18 MED ORDER — ALUM & MAG HYDROXIDE-SIMETH 200-200-20 MG/5ML PO SUSP
30.0000 mL | Freq: Once | ORAL | Status: AC
  Administered 2019-10-18: 30 mL via ORAL
  Filled 2019-10-18: qty 30

## 2019-10-18 NOTE — ED Notes (Signed)
Pt aware of need for urine sample, states unable to provide at this time. Call button within reach, instructed to call out if she feels she can provide a sample

## 2019-10-18 NOTE — ED Triage Notes (Signed)
Pt c/o N/V along w/ dizziness and abdominal pain; stated strted as heart burn and RX zofran and famotidine has not helped. Pt endorsed hx of HTN

## 2019-10-18 NOTE — ED Notes (Signed)
Patient verbalizes understanding of discharge instructions. Opportunity for questioning and answers were provided. Armband removed by staff, pt discharged from ED to home via POV with friend. Pt discharge delayed d/t pt asking to be seen again by PA, nausea not resolved. PA came to bedside and discussed options, pt would like dose of phenergan and GI consult, medication provided (see MAR). Pt ambulatory at discharge.

## 2019-10-18 NOTE — Discharge Instructions (Signed)
You were seen today for abdominal pain. We think you may have some acid reflux and/or gastritis. Please avoid drinking alcohol as this will make your symptoms worse, also your liver enzyme tet was elevated. We are stopping your famotidine because you said it made your symptoms worse. We are starting you on Protonix. Please see your primary doctor soon. Thank you for allowing me to care for you today. Please return to the emergency department if you have new or worsening symptoms. Take your medications as instructed.

## 2019-10-18 NOTE — ED Provider Notes (Addendum)
[facility] EMERGENCY DEPARTMENT Provider Note   CSN: [id number] Arrival date & time: 10/18/19  1017     History Chief Complaint  Patient presents with  . Abdominal Pain  . Dizziness  . Emesis    Kathryn Delgado is a 54 y.o. female.  Is a 54 year old female with past medical history of acid reflux, hypertension, gallstones, obesity presenting to the emergency department for stomach upset and decreased appetite.  Patient reports that this has been going on for about 4 5 days.  Reports that when she eats she feels nauseated and has had a few episodes of vomiting.  Reports normal bowel movements and normal urination.  Denies any fever, chills, chest pain, shortness of breath, cough or URI symptoms.  She has a history of acid reflux but was not taking anything regularly for this.  She was started back on famotidine 2 days ago by her primary care doctor.  She reports that she took 1 dose of it but it made her symptoms worse so she stopped.  Also reports that she has not been taking her blood pressure medication.        Past Medical History:  Diagnosis Date  . Gallstones 05/08/2018   Found on [place]  . Hypertension     Patient Active Problem List   Diagnosis Date Noted  . Alcohol consumption heavy 12/20/2018  . Gastroesophageal reflux disease 12/20/2018  . Gallstones 05/08/2018  . Essential hypertension 04/25/2018  . Chronic pain of left knee 04/25/2018  . Elevated ALT measurement 04/25/2018  . Smoker 04/25/2018    Past Surgical History:  Procedure Laterality Date  . FOOT SURGERY  2005  . FOOT SURGERY Left 2009     OB History   No obstetric history on file.     Family History  Problem Relation Age of Onset  . Diabetes Mother   . Thyroid disease Sister   . Diabetes Sister   . Thyroid disease Sister   . Diabetes Brother     Social History   Tobacco Use  . Smoking status: Current Every Day Smoker    Packs/day: 0.50    Years: 40.00    Pack years:  20.00  . Smokeless tobacco: Never Used  Substance Use Topics  . Alcohol use: Yes    Alcohol/week: 2.0 standard drinks    Types: 2 Cans of beer per week    Comment: socially  . Drug use: No    Comment: former cocaine user     Home Medications Prior to Admission medications   Medication Sig Start Date End Date Taking? Authorizing Provider  losartan-hydrochlorothiazide (HYZAAR) 50-12.5 MG tablet TAKE 1 TABLET BY MOUTH DAILY 04/03/19   Henson, Vickie L, NP-C  pantoprazole (PROTONIX) 20 MG tablet Take 1 tablet (20 mg total) by mouth daily. 10/18/19 11/17/19  Arlyn Dunning, PA-C    Allergies    Patient has no known allergies.  Review of Systems   Review of Systems  Constitutional: Positive for appetite change. Negative for chills and fever.  HENT: Negative for congestion and sore throat.   Eyes: Negative for visual disturbance.  Respiratory: Negative for cough and shortness of breath.   Cardiovascular: Negative for chest pain.  Gastrointestinal: Positive for abdominal pain, nausea and vomiting. Negative for constipation and diarrhea.  Endocrine: Negative for polyuria.  Genitourinary: Negative for decreased urine volume, difficulty urinating and dysuria.  Musculoskeletal: Negative for back pain and myalgias.  Skin: Negative for rash and wound.  Neurological: Negative for  dizziness and light-headedness.  Hematological: Does not bruise/bleed easily.  Psychiatric/Behavioral: Negative for confusion.    Physical Exam Updated Vital Signs BP (!) 180/109   Pulse 71   Temp 98.5 F (36.9 C) (Oral)   Resp 16   Ht 5\' 7"  (1.702 m)   Wt 110.7 kg   SpO2 93%   BMI 38.22 kg/m   Physical Exam Vitals and nursing note reviewed.  Constitutional:      General: She is not in acute distress.    Appearance: Normal appearance. She is well-developed. She is obese. She is not ill-appearing, toxic-appearing or diaphoretic.  HENT:     Head: Normocephalic.  Eyes:     Conjunctiva/sclera:  Conjunctivae normal.  Cardiovascular:     Rate and Rhythm: Normal rate and regular rhythm.  Pulmonary:     Effort: Pulmonary effort is normal.     Breath sounds: Normal breath sounds.  Abdominal:     General: Abdomen is flat. Bowel sounds are normal.     Palpations: Abdomen is soft.     Tenderness: There is no abdominal tenderness.  Skin:    General: Skin is dry.  Neurological:     Mental Status: She is alert and oriented to person, place, and time.  Psychiatric:        Mood and Affect: Mood normal.     ED Results / Procedures / Treatments   Labs (all labs ordered are listed, but only abnormal results are displayed) Labs Reviewed  COMPREHENSIVE METABOLIC PANEL - Abnormal; Notable for the following components:      Result Value   Glucose, Bld 138 (*)    BUN 22 (*)    ALT 78 (*)    Total Bilirubin 1.4 (*)    All other components within normal limits  CBC - Abnormal; Notable for the following components:   RBC 5.42 (*)    Hemoglobin 15.4 (*)    HCT 47.0 (*)    All other components within normal limits  LIPASE, BLOOD  URINALYSIS, ROUTINE W REFLEX MICROSCOPIC    EKG None  Radiology No results found.  Procedures Procedures (including critical care time)  Medications Ordered in ED Medications  promethazine (PHENERGAN) injection 12.5 mg (has no administration in time range)  sodium chloride flush (NS) 0.9 % injection 3 mL (3 mLs Intravenous Given 10/18/19 1113)  pantoprazole (PROTONIX) injection 40 mg (40 mg Intravenous Given 10/18/19 1201)  ondansetron (ZOFRAN) injection 4 mg (4 mg Intravenous Given 10/18/19 1201)  sodium chloride 0.9 % bolus 1,000 mL (1,000 mLs Intravenous New Bag/Given 10/18/19 1201)  alum & mag hydroxide-simeth (MAALOX/MYLANTA) 200-200-20 MG/5ML suspension 30 mL (30 mLs Oral Given 10/18/19 1208)    And  lidocaine (XYLOCAINE) 2 % viscous mouth solution 15 mL (15 mLs Oral Given 10/18/19 1208)    ED Course  I have reviewed the triage vital signs and  the nursing notes.  Pertinent labs & imaging results that were available during my care of the patient were reviewed by me and considered in my medical decision making (see chart for details).  Clinical Course as of Oct 17 1400  Thu Oct 18, 2019  1336 Patient presenting with nausea, abdominal pain with eating for the last 4 days or so.  Has a history of GERD and today she was an appropriate Protonix, fluids, GI cocktail.  Labs are overall reassuring.  She has an elevation in her ALT.  This is not new but is somewhat worse than before.  Elevated to 78.  Has a history of drinking alcohol regularly.  Advised to quit drinking and avoid Tylenol and follow-up with her primary care doctor.  Patient did not want to wait here to give a urine sample and denies any urinary symptoms.  Reports that she is improved and would like to go home now.  We will start her on Protonix and discontinue famotidine.  Encouraged her to take her high blood pressure medication regularly.  Advised on return precautions.   [KM]  1400 When nurse went to discharge the patient she reports she is still nauseated to the nurse and pain is still 10/10 but is better than when she came into the ER. I re-examined the patient at this time and she had a soft and nontender belly with palpation. Offered patient further imaging studies but she declined. I do not think the patient needs narcotics given she has a completely benign belly. She was offered phenergan and referral to GI for possible endoscopy.   [KM]    Clinical Course User Index [KM] Kristine Royal   MDM Rules/Calculators/A&P                      Based on review of vitals, medical screening exam, lab work and/or imaging, there does not appear to be an acute, emergent etiology for the patient's symptoms. Counseled pt on good return precautions and encouraged both PCP and ED follow-up as needed.  Prior to discharge, I also discussed incidental imaging findings with patient in  detail and advised appropriate, recommended follow-up in detail.  Clinical Impression: 1. Generalized abdominal pain   2. Gastroesophageal reflux disease, unspecified whether esophagitis present     Disposition: Discharge  Prior to providing a prescription for a controlled substance, I independently reviewed the patient's recent prescription history on the [place]. The patient had no recent or regular prescriptions and was deemed appropriate for a brief, less than 3 day prescription of narcotic for acute analgesia.  This note was prepared with assistance of Systems analyst. Occasional wrong-word or sound-a-like substitutions may have occurred due to the inherent limitations of voice recognition software.  Final Clinical Impression(s) / ED Diagnoses Final diagnoses:  Generalized abdominal pain  Gastroesophageal reflux disease, unspecified whether esophagitis present    Rx / DC Orders ED Discharge Orders         Ordered    pantoprazole (PROTONIX) 20 MG tablet  Daily     10/18/19 1339           Kristine Royal 10/18/19 1340    [address] 10/18/19 1403    Charlesetta Shanks, MD 10/19/19 1639

## 2019-10-25 ENCOUNTER — Ambulatory Visit: Payer: No Typology Code available for payment source | Attending: Internal Medicine

## 2019-10-25 DIAGNOSIS — Z23 Encounter for immunization: Secondary | ICD-10-CM

## 2019-10-25 LAB — HM COLONOSCOPY

## 2019-10-25 NOTE — Progress Notes (Signed)
   Covid-19 Vaccination Clinic  Name:  Marjoria Mancillas    MRN: [id number] DOB: 12/13/1965  10/25/2019  Ms. Cheese was observed post Covid-19 immunization for 15 minutes without incident. She was provided with Vaccine Information Sheet and instruction to access the V-Safe system.   Ms. Vanblarcom was instructed to call 911 with any severe reactions post vaccine: Marland Kitchen Difficulty breathing  . Swelling of face and throat  . A fast heartbeat  . A bad rash all over body  . Dizziness and weakness   Immunizations Administered    Name Date Dose VIS Date Route   Pfizer COVID-19 Vaccine 10/25/2019  1:13 PM 0.3 mL 08/01/2018 Intramuscular   Manufacturer: [employer], [employer]   Lot: [id number]   NDC: [address]-0     b  Covid-19 Vaccination Clinic  Name:  Nadean Montanaro    MRN: [id number] DOB: 10/25/1965  10/25/2019  Ms. Saddler was observed post Covid-19 immunization for 15 minutes without incident. She was provided with Vaccine Information Sheet and instruction to access the V-Safe system.   Ms. Schoneman was instructed to call 911 with any severe reactions post vaccine: Marland Kitchen Difficulty breathing  . Swelling of face and throat  . A fast heartbeat  . A bad rash all over body  . Dizziness and weakness   Immunizations Administered    Name Date Dose VIS Date Route   Pfizer COVID-19 Vaccine 10/25/2019  1:13 PM 0.3 mL 08/01/2018 Intramuscular   Manufacturer: [employer], [employer]   Lot: [id number]   NDC: [address]-2

## 2019-12-06 ENCOUNTER — Encounter: Payer: Self-pay | Admitting: Family Medicine

## 2019-12-06 ENCOUNTER — Ambulatory Visit: Payer: 59 | Admitting: Family Medicine

## 2019-12-06 ENCOUNTER — Other Ambulatory Visit: Payer: Self-pay

## 2019-12-06 ENCOUNTER — Encounter: Payer: Self-pay | Admitting: Gastroenterology

## 2019-12-06 ENCOUNTER — Other Ambulatory Visit: Payer: Self-pay | Admitting: Family Medicine

## 2019-12-06 VITALS — BP 120/80 | HR 93 | Wt 240.8 lb

## 2019-12-06 DIAGNOSIS — K219 Gastro-esophageal reflux disease without esophagitis: Secondary | ICD-10-CM

## 2019-12-06 DIAGNOSIS — I700 Atherosclerosis of aorta: Secondary | ICD-10-CM

## 2019-12-06 DIAGNOSIS — F17200 Nicotine dependence, unspecified, uncomplicated: Secondary | ICD-10-CM | POA: Diagnosis not present

## 2019-12-06 DIAGNOSIS — K769 Liver disease, unspecified: Secondary | ICD-10-CM | POA: Insufficient documentation

## 2019-12-06 DIAGNOSIS — R10812 Left upper quadrant abdominal tenderness: Secondary | ICD-10-CM

## 2019-12-06 DIAGNOSIS — F1090 Alcohol use, unspecified, uncomplicated: Secondary | ICD-10-CM

## 2019-12-06 DIAGNOSIS — Z789 Other specified health status: Secondary | ICD-10-CM

## 2019-12-06 DIAGNOSIS — R748 Abnormal levels of other serum enzymes: Secondary | ICD-10-CM

## 2019-12-06 DIAGNOSIS — I10 Essential (primary) hypertension: Secondary | ICD-10-CM | POA: Diagnosis not present

## 2019-12-06 DIAGNOSIS — R1013 Epigastric pain: Secondary | ICD-10-CM

## 2019-12-06 DIAGNOSIS — Z7289 Other problems related to lifestyle: Secondary | ICD-10-CM

## 2019-12-06 HISTORY — DX: Atherosclerosis of aorta: I70.0

## 2019-12-06 HISTORY — DX: Liver disease, unspecified: K76.9

## 2019-12-06 MED ORDER — ENALAPRIL MALEATE 5 MG PO TABS: 5.0000 mg | ORAL_TABLET | Freq: Every day | ORAL | 2 refills | Status: DC

## 2019-12-06 MED ORDER — PANTOPRAZOLE SODIUM 20 MG PO TBEC: 20.0000 mg | DELAYED_RELEASE_TABLET | Freq: Every day | ORAL | 0 refills | Status: DC

## 2019-12-06 NOTE — Patient Instructions (Signed)
DASH Eating Plan DASH stands for "Dietary Approaches to Stop Hypertension." The DASH eating plan is a healthy eating plan that has been shown to reduce high blood pressure (hypertension). It may also reduce your risk for type 2 diabetes, heart disease, and stroke. The DASH eating plan may also help with weight loss. What are tips for following this plan?  General guidelines  Avoid eating more than 2,300 mg (milligrams) of salt (sodium) a day. If you have hypertension, you may need to reduce your sodium intake to 1,500 mg a day.  Limit alcohol intake to no more than 1 drink a day for nonpregnant women and 2 drinks a day for men. One drink equals 12 oz of beer, 5 oz of wine, or 1 oz of hard liquor.  Work with your health care provider to maintain a healthy body weight or to lose weight. Ask what an ideal weight is for you.  Get at least 30 minutes of exercise that causes your heart to beat faster (aerobic exercise) most days of the week. Activities may include walking, swimming, or biking.  Work with your health care provider or diet and nutrition specialist (dietitian) to adjust your eating plan to your individual calorie needs. Reading food labels   Check food labels for the amount of sodium per serving. Choose foods with less than 5 percent of the Daily Value of sodium. Generally, foods with less than 300 mg of sodium per serving fit into this eating plan.  To find whole grains, look for the word "whole" as the first word in the ingredient list. Shopping  Buy products labeled as "low-sodium" or "no salt added."  Buy fresh foods. Avoid canned foods and premade or frozen meals. Cooking  Avoid adding salt when cooking. Use salt-free seasonings or herbs instead of table salt or sea salt. Check with your health care provider or pharmacist before using salt substitutes.  Do not fry foods. Cook foods using healthy methods such as baking, boiling, grilling, and broiling instead.  Cook with  heart-healthy oils, such as olive, canola, soybean, or sunflower oil. Meal planning  Eat a balanced diet that includes: ? 5 or more servings of fruits and vegetables each day. At each meal, try to fill half of your plate with fruits and vegetables. ? Up to 6-8 servings of whole grains each day. ? Less than 6 oz of lean meat, poultry, or fish each day. A 3-oz serving of meat is about the same size as a deck of cards. One egg equals 1 oz. ? 2 servings of low-fat dairy each day. ? A serving of nuts, seeds, or beans 5 times each week. ? Heart-healthy fats. Healthy fats called Omega-3 fatty acids are found in foods such as flaxseeds and coldwater fish, like sardines, salmon, and mackerel.  Limit how much you eat of the following: ? Canned or prepackaged foods. ? Food that is high in trans fat, such as fried foods. ? Food that is high in saturated fat, such as fatty meat. ? Sweets, desserts, sugary drinks, and other foods with added sugar. ? Full-fat dairy products.  Do not salt foods before eating.  Try to eat at least 2 vegetarian meals each week.  Eat more home-cooked food and less restaurant, buffet, and fast food.  When eating at a restaurant, ask that your food be prepared with less salt or no salt, if possible. What foods are recommended? The items listed may not be a complete list. Talk with your dietitian about   what dietary choices are best for you. Grains Whole-grain or whole-wheat bread. Whole-grain or whole-wheat pasta. Brown rice. Oatmeal. Quinoa. Bulgur. Whole-grain and low-sodium cereals. Pita bread. Low-fat, low-sodium crackers. Whole-wheat flour tortillas. Vegetables Fresh or frozen vegetables (raw, steamed, roasted, or grilled). Low-sodium or reduced-sodium tomato and vegetable juice. Low-sodium or reduced-sodium tomato sauce and tomato paste. Low-sodium or reduced-sodium canned vegetables. Fruits All fresh, dried, or frozen fruit. Canned fruit in natural juice (without  added sugar). Meat and other protein foods Skinless chicken or turkey. Ground chicken or turkey. Pork with fat trimmed off. Fish and seafood. Egg whites. Dried beans, peas, or lentils. Unsalted nuts, nut butters, and seeds. Unsalted canned beans. Lean cuts of beef with fat trimmed off. Low-sodium, lean deli meat. Dairy Low-fat (1%) or fat-free (skim) milk. Fat-free, low-fat, or reduced-fat cheeses. Nonfat, low-sodium ricotta or cottage cheese. Low-fat or nonfat yogurt. Low-fat, low-sodium cheese. Fats and oils Soft margarine without trans fats. Vegetable oil. Low-fat, reduced-fat, or light mayonnaise and salad dressings (reduced-sodium). Canola, safflower, olive, soybean, and sunflower oils. Avocado. Seasoning and other foods Herbs. Spices. Seasoning mixes without salt. Unsalted popcorn and pretzels. Fat-free sweets. What foods are not recommended? The items listed may not be a complete list. Talk with your dietitian about what dietary choices are best for you. Grains Baked goods made with fat, such as croissants, muffins, or some breads. Dry pasta or rice meal packs. Vegetables Creamed or fried vegetables. Vegetables in a cheese sauce. Regular canned vegetables (not low-sodium or reduced-sodium). Regular canned tomato sauce and paste (not low-sodium or reduced-sodium). Regular tomato and vegetable juice (not low-sodium or reduced-sodium). Pickles. Olives. Fruits Canned fruit in a light or heavy syrup. Fried fruit. Fruit in cream or butter sauce. Meat and other protein foods Fatty cuts of meat. Ribs. Fried meat. Bacon. Sausage. Bologna and other processed lunch meats. Salami. Fatback. Hotdogs. Bratwurst. Salted nuts and seeds. Canned beans with added salt. Canned or smoked fish. Whole eggs or egg yolks. Chicken or turkey with skin. Dairy Whole or 2% milk, cream, and half-and-half. Whole or full-fat cream cheese. Whole-fat or sweetened yogurt. Full-fat cheese. Nondairy creamers. Whipped toppings.  Processed cheese and cheese spreads. Fats and oils Butter. Stick margarine. Lard. Shortening. Ghee. Bacon fat. Tropical oils, such as coconut, palm kernel, or palm oil. Seasoning and other foods Salted popcorn and pretzels. Onion salt, garlic salt, seasoned salt, table salt, and sea salt. Worcestershire sauce. Tartar sauce. Barbecue sauce. Teriyaki sauce. Soy sauce, including reduced-sodium. Steak sauce. Canned and packaged gravies. Fish sauce. Oyster sauce. Cocktail sauce. Horseradish that you find on the shelf. Ketchup. Mustard. Meat flavorings and tenderizers. Bouillon cubes. Hot sauce and Tabasco sauce. Premade or packaged marinades. Premade or packaged taco seasonings. Relishes. Regular salad dressings. Where to find more information:  National Heart, Lung, and Blood Institute: www.nhlbi.nih.gov  American Heart Association: www.heart.org Summary  The DASH eating plan is a healthy eating plan that has been shown to reduce high blood pressure (hypertension). It may also reduce your risk for type 2 diabetes, heart disease, and stroke.  With the DASH eating plan, you should limit salt (sodium) intake to 2,300 mg a day. If you have hypertension, you may need to reduce your sodium intake to 1,500 mg a day.  When on the DASH eating plan, aim to eat more fresh fruits and vegetables, whole grains, lean proteins, low-fat dairy, and heart-healthy fats.  Work with your health care provider or diet and nutrition specialist (dietitian) to adjust your eating plan to your   individual calorie needs. This information is not intended to replace advice given to you by your health care provider. Make sure you discuss any questions you have with your health care provider. Document Revised: 05/06/2017 Document Reviewed: 05/17/2016 Elsevier Patient Education  2020 [employer]   Preventing High Cholesterol Cholesterol is a white, waxy substance similar to fat that the human body needs to help build cells. The  liver makes all the cholesterol that a person's body needs. Having high cholesterol (hypercholesterolemia) increases a person's risk for heart disease and stroke. Extra (excess) cholesterol comes from the food the person eats. High cholesterol can often be prevented with diet and lifestyle changes. If you already have high cholesterol, you can control it with diet and lifestyle changes and with medicine. How can high cholesterol affect me? If you have high cholesterol, deposits (plaques) may build up on the walls of your arteries. The arteries are the blood vessels that carry blood away from your heart. Plaques make the arteries narrower and stiffer. This can limit or block blood flow and cause blood clots to form. Blood clots:  Are tiny balls of cells that form in your blood.  Can move to the heart or brain, causing a heart attack or stroke. Plaques in arteries greatly increase your risk for heart attack and stroke.Making diet and lifestyle changes can reduce your risk for these conditions that may threaten your life. What can increase my risk? This condition is more likely to develop in people who:  Eat foods that are high in saturated fat or cholesterol. Saturated fat is mostly found in: ? Foods that contain animal fat, such as red meat and some dairy products. ? Certain fatty foods made from plants, such as tropical oils.  Are overweight.  Are not getting enough exercise.  Have a family history of high cholesterol. What actions can I take to prevent this? Nutrition   Eat less saturated fat.  Avoid trans fats (partially hydrogenated oils). These are often found in margarine and in some baked goods, fried foods, and snacks bought in packages.  Avoid precooked or cured meat, such as sausages or meat loaves.  Avoid foods and drinks that have added sugars.  Eat more fruits, vegetables, and whole grains.  Choose healthy sources of protein, such as fish, poultry, lean cuts of red  meat, beans, peas, lentils, and nuts.  Choose healthy sources of fat, such as: ? Nuts. ? Vegetable oils, especially olive oil. ? Fish that have healthy fats (omega-3 fatty acids), such as mackerel or salmon. The items listed above may not be a complete list of recommended foods and beverages. Contact a dietitian for more information. Lifestyle  Lose weight if you are overweight. Losing 5-10 lb (2.3-4.5 kg) can help prevent or control high cholesterol. It can also lower your risk for diabetes and high blood pressure. Ask your health care provider to help you with a diet and exercise plan to lose weight safely.  Do not use any products that contain nicotine or tobacco, such as cigarettes, e-cigarettes, and chewing tobacco. If you need help quitting, ask your health care provider.  Limit your alcohol intake. ? Do not drink alcohol if:  Your health care provider tells you not to drink.  You are pregnant, may be pregnant, or are planning to become pregnant. ? If you drink alcohol:  Limit how much you use to:  0-1 drink a day for women.  0-2 drinks a day for men.  Be aware of how  much alcohol is in your drink. In the [place] one drink equals one 12 oz bottle of beer (355 mL), one 5 oz glass of wine (148 mL), or one 1 oz glass of hard liquor (44 mL). Activity   Get enough exercise. Each week, do at least 150 minutes of exercise that takes a medium level of effort (moderate-intensity exercise). ? This is exercise that:  Makes your heart beat faster and makes you breathe harder than usual.  Allows you to still be able to talk. ? You could exercise in short sessions several times a day or longer sessions a few times a week. For example, on 5 days each week, you could walk fast or ride your bike 3 times a day for 10 minutes each time.  Do exercises as told by your health care provider. Medicines  In addition to diet and lifestyle changes, your health care provider may recommend  medicines to help lower cholesterol. This may be a medicine to lower the amount of cholesterol your liver makes. You may need medicine if: ? Diet and lifestyle changes do not lower your cholesterol enough. ? You have high cholesterol and other risk factors for heart disease or stroke.  Take over-the-counter and prescription medicines only as told by your health care provider. General information  Manage your risk factors for high cholesterol. Talk with your health care provider about all your risk factors and how to lower your risk.  Manage other conditions that you have, such as diabetes or high blood pressure (hypertension).  Have blood tests to check your cholesterol levels at regular points in time as told by your health care provider.  Keep all follow-up visits as told by your health care provider. This is important. Where to find more information  American Heart Association: www.heart.org  National Heart, Lung, and Blood Institute: [employer].is Summary  High cholesterol increases your risk for heart disease and stroke. By keeping your cholesterol level low, you can reduce your risk for these conditions.  High cholesterol can often be prevented with diet and lifestyle changes.  Work with your health care provider to manage your risk factors, and have your blood tested regularly. This information is not intended to replace advice given to you by your health care provider. Make sure you discuss any questions you have with your health care provider. Document Revised: 09/15/2018 Document Reviewed: 01/31/2016 Elsevier Patient Education  2020 [employer].

## 2019-12-06 NOTE — Progress Notes (Signed)
Subjective:    Patient ID: Kathryn Delgado, female    DOB: 1966/04/04, 54 y.o.   MRN: [id number]  HPI Chief Complaint  Patient presents with  . med check    med check. no other concerns   She is here for a medication management visit.   She has also been going to [facility]. Last seen there one month ago.   States she was prescribed enalapril 2.5 mg for her HTN.   She was evaluated in the ED on 2 separate occasions for abdominal pain and was found to have mild thickening of the gastroesophageal junction and liver lesions on CT. She was also found to have aortic atherosclerosis.   Complains of epigastric pain that is persistent. No hemoptysis. States her previous PCP wanted her to see GI and she did not follow up.   GERD- taking Pepcid at bedtime. Taking Protonix in the morning.   Liver lesions found on CT.   Alcohol-states she has but back on her alcohol intake. She has been a heavy drinker for years.   Smoking- she is considering stopping. Is not ready yet.   Aortic atherosclerosis- was not aware. Lipids normal in 2018.     Review of Systems Pertinent positives and negatives in the history of present illness.     Objective:   Physical Exam Constitutional:      General: She is not in acute distress.    Appearance: Normal appearance. She is not ill-appearing.  Cardiovascular:     Rate and Rhythm: Normal rate and regular rhythm.     Pulses: Normal pulses.  Pulmonary:     Effort: Pulmonary effort is normal.     Breath sounds: Normal breath sounds.  Abdominal:     General: Abdomen is flat. Bowel sounds are normal. There is no distension.     Palpations: Abdomen is soft.     Tenderness: There is abdominal tenderness in the left upper quadrant. There is no right CVA tenderness, left CVA tenderness, guarding or rebound. Negative signs include Murphy's sign, McBurney's sign and psoas sign.  Musculoskeletal:     Cervical back: Normal range of motion and neck supple.    Neurological:     Mental Status: She is alert.    BP 120/80   Pulse 93   Wt 240 lb 12.8 oz (109.2 kg)   BMI 37.71 kg/m       Assessment & Plan:  Essential hypertension - Plan: CBC with Differential/Platelet, Comprehensive metabolic panel -BP well controlled. Continue current medication. Discussed lifestyle modifications and DASH diet.   Gastroesophageal reflux disease, unspecified whether esophagitis present - Plan: Ambulatory referral to Gastroenterology -she is taking several acid reducers as prescribed by the ED and her previous PCP. Reviewed CT abdomen. Recommend she stop smoking, reduce food triggers, overeating, eating and laying down and NSAIDs. Refer to GI for further evaluation. She is in favor of seeing GI and is open to EGD if they think this is necessary.   Smoker -counseling on smoking cessation. She is not ready to stop  Liver lesion - Plan: Ambulatory referral to Gastroenterology, Comprehensive metabolic panel  Aortic atherosclerosis (HCC) - Plan: Lipid panel -discussed that I cannot tell her how bad her atherosclerosis is and that this is a risk factor for heart disease and stroke. Encouraged low fat, low cholesterol diet, stopping smoking, continuing to reduce alcohol and consider starting on a statin. We will check lipids and follow up.   Elevated liver enzymes - Plan: Ambulatory referral  to Gastroenterology, Comprehensive metabolic panel -encouraged stopping alcohol and improving her diet. Refer to GI  Alcohol use -she has reportedly cut back but is still drinking. Discussed increased risk of cirrhosis and liver cancer.   Left upper quadrant abdominal tenderness without rebound tenderness - Plan: CBC with Differential/Platelet, Comprehensive metabolic panel -no red flag symptoms. Check labs, reviewed recent CT abdomen. Refer to GI  Epigastric abdominal pain - Plan: CBC with Differential/Platelet, Comprehensive metabolic panel -she is taking several acid  reducers as prescribed by the ED and her previous PCP. Reviewed CT abdomen. Recommend she stop smoking, reduce food triggers, overeating, eating and laying down and NSAIDs. Refer to GI for further evaluation. She is in favor of seeing GI and is open to EGD if they think this is necessary.

## 2019-12-06 NOTE — Telephone Encounter (Signed)
Is this okay to refill? 

## 2019-12-07 LAB — CBC WITH DIFFERENTIAL/PLATELET
Basophils Absolute: 0 10*3/uL (ref 0.0–0.2)
Basos: 1 %
EOS (ABSOLUTE): 0.1 10*3/uL (ref 0.0–0.4)
Eos: 1 %
Hematocrit: 40.8 % (ref 34.0–46.6)
Hemoglobin: 13.6 g/dL (ref 11.1–15.9)
Immature Grans (Abs): 0 10*3/uL (ref 0.0–0.1)
Immature Granulocytes: 0 %
Lymphocytes Absolute: 1.5 10*3/uL (ref 0.7–3.1)
Lymphs: 36 %
MCH: 28.2 pg (ref 26.6–33.0)
MCHC: 33.3 g/dL (ref 31.5–35.7)
MCV: 85 fL (ref 79–97)
Monocytes Absolute: 0.3 10*3/uL (ref 0.1–0.9)
Monocytes: 7 %
Neutrophils Absolute: 2.3 10*3/uL (ref 1.4–7.0)
Neutrophils: 55 %
Platelets: 239 10*3/uL (ref 150–450)
RBC: 4.82 x10E6/uL (ref 3.77–5.28)
RDW: 12.6 % (ref 11.7–15.4)
WBC: 4.2 10*3/uL (ref 3.4–10.8)

## 2019-12-07 LAB — COMPREHENSIVE METABOLIC PANEL
ALT: 24 IU/L (ref 0–32)
AST: 16 IU/L (ref 0–40)
Albumin/Globulin Ratio: 1.4 (ref 1.2–2.2)
Albumin: 4.1 g/dL (ref 3.8–4.9)
Alkaline Phosphatase: 87 IU/L (ref 48–121)
BUN/Creatinine Ratio: 11 (ref 9–23)
BUN: 8 mg/dL (ref 6–24)
Bilirubin Total: 0.7 mg/dL (ref 0.0–1.2)
CO2: 21 mmol/L (ref 20–29)
Calcium: 9.4 mg/dL (ref 8.7–10.2)
Chloride: 109 mmol/L — ABNORMAL HIGH (ref 96–106)
Creatinine, Ser: 0.71 mg/dL (ref 0.57–1.00)
GFR calc Af Amer: 112 mL/min/{1.73_m2} (ref >59)
GFR calc non Af Amer: 97 mL/min/{1.73_m2} (ref >59)
Globulin, Total: 2.9 g/dL (ref 1.5–4.5)
Glucose: 111 mg/dL — ABNORMAL HIGH (ref 65–99)
Potassium: 3.9 mmol/L (ref 3.5–5.2)
Sodium: 142 mmol/L (ref 134–144)
Total Protein: 7 g/dL (ref 6.0–8.5)

## 2019-12-07 LAB — LIPID PANEL
Chol/HDL Ratio: 4 ratio (ref 0.0–4.4)
Cholesterol, Total: 166 mg/dL (ref 100–199)
HDL: 41 mg/dL (ref >39)
LDL Chol Calc (NIH): 111 mg/dL — ABNORMAL HIGH (ref 0–99)
Triglycerides: 72 mg/dL (ref 0–149)
VLDL Cholesterol Cal: 14 mg/dL (ref 5–40)

## 2019-12-08 ENCOUNTER — Encounter: Payer: Self-pay | Admitting: Family Medicine

## 2019-12-08 ENCOUNTER — Other Ambulatory Visit: Payer: Self-pay | Admitting: Family Medicine

## 2019-12-08 DIAGNOSIS — I700 Atherosclerosis of aorta: Secondary | ICD-10-CM

## 2019-12-08 DIAGNOSIS — E785 Hyperlipidemia, unspecified: Secondary | ICD-10-CM | POA: Insufficient documentation

## 2019-12-08 DIAGNOSIS — Z9189 Other specified personal risk factors, not elsewhere classified: Secondary | ICD-10-CM

## 2019-12-08 DIAGNOSIS — E7800 Pure hypercholesterolemia, unspecified: Secondary | ICD-10-CM

## 2019-12-08 MED ORDER — ATORVASTATIN CALCIUM 10 MG PO TABS: 10.0000 mg | ORAL_TABLET | Freq: Every day | ORAL | 1 refills | Status: DC

## 2019-12-08 NOTE — Progress Notes (Signed)
She should start the atorvastatin and come in for labs after being on the medication for 6 weeks.

## 2019-12-11 ENCOUNTER — Other Ambulatory Visit: Payer: Self-pay | Admitting: Internal Medicine

## 2019-12-11 DIAGNOSIS — E7800 Pure hypercholesterolemia, unspecified: Secondary | ICD-10-CM

## 2019-12-19 ENCOUNTER — Telehealth: Payer: Self-pay | Admitting: Family Medicine

## 2019-12-19 NOTE — Telephone Encounter (Signed)
Requested records received from [facility].

## 2020-01-03 ENCOUNTER — Ambulatory Visit: Payer: Managed Care, Other (non HMO) | Admitting: Gastroenterology

## 2020-01-03 ENCOUNTER — Encounter: Payer: Self-pay | Admitting: Gastroenterology

## 2020-01-03 VITALS — BP 128/90 | HR 80 | Ht 67.0 in | Wt 236.0 lb

## 2020-01-03 DIAGNOSIS — Z1211 Encounter for screening for malignant neoplasm of colon: Secondary | ICD-10-CM | POA: Diagnosis not present

## 2020-01-03 DIAGNOSIS — R1013 Epigastric pain: Secondary | ICD-10-CM

## 2020-01-03 DIAGNOSIS — R933 Abnormal findings on diagnostic imaging of other parts of digestive tract: Secondary | ICD-10-CM

## 2020-01-03 DIAGNOSIS — K8020 Calculus of gallbladder without cholecystitis without obstruction: Secondary | ICD-10-CM | POA: Diagnosis not present

## 2020-01-03 NOTE — Progress Notes (Signed)
Gastroenterology Consult Note:  History: Kathryn Delgado 01/03/2020  Referring provider: Avanell Shackleton, NP-C  Reason for consult/chief complaint: Gastroesophageal Reflux (Epigastric pain, constant, medication ineffective)   Subjective  HPI:  This is a 54 year old woman referred by primary care for upper abdominal pain.  It was initially somewhat difficult to get a history, as she was somewhat frustrated that she had told the story multiple times to different providers I do not seem to be getting any answers.  Ultimately, it is about 6 months of frequent pain that is primarily midepigastric but sometimes radiating across the upper abdomen in a bandlike fashion.  It is there every day, varies in severity but sometimes a 10/10.  It is probably worse after meals but she has not always really noticed.  Denies chronic nausea or vomiting except for occasions when the pain is severe, such as the ED visits.  Denies dysphagia or odynophagia. In last several months has noticed the bowel habits decreasing in frequency.  She denies rectal bleeding.  I reviewed provider note from this patient's visit to the [facility] ED on May 13.  She had 10 out of 10 abdominal pain, felt somewhat better with GI cocktail, then pain returned.  She declined imaging and a urine study.  Elevation of ALT, which had been previously noted. Patient was then in the [facility] ED 2 days later.    ROS:  Review of Systems  Constitutional: Negative for appetite change and unexpected weight change.  HENT: Negative for mouth sores and voice change.   Eyes: Negative for pain and redness.  Respiratory: Negative for cough and shortness of breath.   Cardiovascular: Negative for chest pain and palpitations.  Genitourinary: Negative for dysuria and hematuria.  Musculoskeletal: Negative for arthralgias and myalgias.  Skin: Negative for pallor and rash.  Neurological: Negative for weakness and headaches.    Hematological: Negative for adenopathy.     Past Medical History: Past Medical History:  Diagnosis Date  . Alcohol use   . Aortic atherosclerosis (HCC) 12/06/2019  . Gallstones 05/08/2018   Found on [place]  . Hyperlipidemia   . Hypertension   . Liver lesion 12/06/2019     Past Surgical History: Past Surgical History:  Procedure Laterality Date  . FOOT SURGERY  2005  . FOOT SURGERY Left 2009  . KNEE ARTHROCENTESIS  08/2018     Family History: Family History  Problem Relation Age of Onset  . Diabetes Mother   . Thyroid disease Sister   . Diabetes Sister   . Thyroid disease Sister   . Diabetes Brother     Social History: Social History   Socioeconomic History  . Marital status: Significant Other    Spouse name: Not on file  . Number of children: Not on file  . Years of education: Not on file  . Highest education level: Not on file  Occupational History  . Not on file  Tobacco Use  . Smoking status: Current Every Day Smoker    Packs/day: 0.50    Years: 40.00    Pack years: 20.00  . Smokeless tobacco: Never Used  Vaping Use  . Vaping Use: Never used  Substance and Sexual Activity  . Alcohol use: Yes    Alcohol/week: 2.0 standard drinks    Types: 2 Cans of beer per week    Comment: socially  . Drug use: No    Comment: former cocaine user   . Sexual activity: Yes  Partners: Female    Birth control/protection: Post-menopausal  Other Topics Concern  . Not on file  Social History Narrative  . Not on file   Social Determinants of Health   Financial Resource Strain:   . Difficulty of Paying Living Expenses:   Food Insecurity:   . Worried About Programme researcher, broadcasting/film/videounning Out of Food in the Last Year:   . Baristaan Out of Food in the Last Year:   Transportation Needs:   . Freight forwarderLack of Transportation (Medical):   Marland Kitchen. Lack of Transportation (Non-Medical):   Physical Activity:   . Days of Exercise per Week:   . Minutes of Exercise per Session:   Stress:   . Feeling of Stress :   Social  Connections:   . Frequency of Communication with Friends and Family:   . Frequency of Social Gatherings with Friends and Family:   . Attends Religious Services:   . Active Member of Clubs or Organizations:   . Attends BankerClub or Organization Meetings:   Marland Kitchen. Marital Status:     Allergies: No Known Allergies  Outpatient Meds: Current Outpatient Medications  Medication Sig Dispense Refill  . atorvastatin (LIPITOR) 10 MG tablet Take 1 tablet (10 mg total) by mouth daily. 90 tablet 1  . enalapril (VASOTEC) 5 MG tablet Take 1 tablet (5 mg total) by mouth daily. 30 tablet 2  . famotidine (PEPCID) 40 MG tablet Take 40 mg by mouth at bedtime.     No current facility-administered medications for this visit.      ___________________________________________________________________ Objective   Exam:  BP (!) 128/90   Pulse 80   Ht 5\' 7"  (1.702 m)   Wt (!) 236 lb (107 kg)   BMI 36.96 kg/m    General: Well-appearing  Eyes: sclera anicteric, no redness  ENT: oral mucosa moist without lesions, no cervical or supraclavicular lymphadenopathy  CV: RRR without murmur, S1/S2, no JVD, no peripheral edema  Resp: clear to auscultation bilaterally, normal RR and effort noted  GI: soft, obese, mild LUQ tenderness, with active bowel sounds. No guarding or palpable organomegaly noted.  Skin; warm and dry, no rash or jaundice noted  Neuro: awake, alert and oriented x 3. Normal gross motor function and fluent speech  Labs:  CBC Latest Ref Rng & Units 12/06/2019 10/18/2019 02/10/2017  WBC 3.4 - 10.8 x10E3/uL 4.2 9.2 5.4  Hemoglobin 11.1 - 15.9 g/dL 16.113.6 15.4(H) 13.4  Hematocrit 34.0 - 46.6 % 40.8 47.0(H) 42.2  Platelets 150 - 450 x10E3/uL 239 275 194   CMP Latest Ref Rng & Units 12/06/2019 10/18/2019 04/25/2018  Glucose 65 - 99 mg/dL 096(E111(H) 454(U138(H) -  BUN 6 - 24 mg/dL 8 98(J22(H) -  Creatinine 1.910.57 - 1.00 mg/dL 4.780.71 2.950.78 -  Sodium 621134 - 144 mmol/L 142 135 -  Potassium 3.5 - 5.2 mmol/L 3.9 3.7 -    Chloride 96 - 106 mmol/L 109(H) 101 -  CO2 20 - 29 mmol/L 21 22 -  Calcium 8.7 - 10.2 mg/dL 9.4 9.5 -  Total Protein 6.0 - 8.5 g/dL 7.0 7.9 6.9  Total Bilirubin 0.0 - 1.2 mg/dL 0.7 3.0(Q1.4(H) 0.5  Alkaline Phos 48 - 121 IU/L 87 79 61  AST 0 - 40 IU/L 16 38 24  ALT 0 - 32 IU/L 24 78(H) 41(H)    Negative acute hepatitis panel in 2019  At [facility] ED visit, AST 18, ALT 50, alkaline phosphatase 82, total bilirubin 0.9   Radiologic Studies: CT scan done at [facility] ED visit:  CT  ABDOMEN AND PELVIS WITH CONTRAST  TECHNIQUE: Multidetector CT imaging of the abdomen and pelvis was performed using the standard protocol following bolus administration of intravenous contrast.  CONTRAST: 100 cc Omnipaque 350 IV  COMPARISON: Abdominal ultrasound 05/08/2018  FINDINGS: Lower chest: Lung bases are clear.  Hepatobiliary: There are multiple hypodense liver lesions, many of which have peripheral puddling of contrast and fill-in on delayed phase imaging. Largest lesion measures 3.6 cm in the subcapsular right lobe and is consistent with hemangioma. Additional lesions are incompletely characterized given size and phase of contrast. Gallbladder physiologically distended, no calcified stone. No biliary dilatation.  Pancreas: No ductal dilatation or inflammation.  Spleen: Normal in size without focal abnormality.  Adrenals/Urinary Tract: No adrenal nodule. No hydronephrosis or perinephric edema. Homogeneous renal enhancement with symmetric excretion on delayed phase imaging. Scattered small low-density lesions in both kidneys, majority are too small to accurately characterize, largest in the left kidney measures 17 mm and is consistent with simple cyst. Urinary bladder is partially distended without wall thickening.  Stomach/Bowel: Minimal wall thickening at the gastroesophageal junction. Nondistended stomach is unremarkable. No small bowel obstruction or inflammation. Normal appendix. Small  volume of colonic stool without inflammation or wall thickening.  Vascular/Lymphatic: Moderate aortic atherosclerosis. No aortic aneurysm. No adenopathy. Portal vein is patent.  Reproductive: Uterus and bilateral adnexa are unremarkable.  Other: No free air, free fluid, or intra-abdominal fluid collection. Small fat containing umbilical hernia.  Musculoskeletal: There are no acute or suspicious osseous abnormalities.  IMPRESSION: 1. Minimal wall thickening at the gastroesophageal junction, can be seen with reflux or esophagitis. 2. Multiple hypodense liver lesions, many of which have peripheral puddling of contrast, and are consistent with hemangiomas, largest lesion measures 3.6 cm. Additional lesions are incompletely characterized given size and single phase of contrast. While these have an overall benign appearance, focal lesions were not seen on 2019 ultrasound, and further evaluation with hepatic protocol MRI could be considered for definitive characterization.  Aortic Atherosclerosis (ICD10-I70.0).  Electronically Signed By: Narda Rutherford M.D. On: 10/20/2019 23:05 ____________________________  2019 [place] with gallstones  CLINICAL DATA:  Elevated liver function tests.   EXAM: ULTRASOUND ABDOMEN LIMITED RIGHT UPPER QUADRANT   COMPARISON:  None.   FINDINGS: Gallbladder:   Multiple gallstones, largest measured stone is 2 cm greatest dimension. No gallbladder wall thickening or pericholecystic fluid demonstrated. No evidence of sonographic Murphy sign elicited during the exam.   Common bile duct:   Diameter: Normal ranging from 3-5 mm.   Liver:   No focal lesion identified. Questionably echogenic raising the possibility of fatty infiltration. Portal vein is patent on color Doppler imaging with normal direction of blood flow towards the liver.   IMPRESSION: 1. Cholelithiasis without evidence of acute cholecystitis. 2. No bile duct dilatation. 3. Liver is  unremarkable. Questionable fatty infiltration of the liver.     Electronically Signed   By: Bary Richard M.D.   On: 05/08/2018 10:07    Assessment: Encounter Diagnoses  Name Primary?  . Epigastric pain Yes  . Abnormal finding on GI tract imaging   . Gallstones   . Special screening for malignant neoplasms, colon     About 6 months of escalating epigastric pain, sounds again may be biliary colic from gallstones that are known from 2019 ultrasound.  None were seen on the recent CT scan, but it is less sensitive for noncalcified gallstones, especially when gallbladder is contracted on imaging.  Questionable thickening EG junction on recent CT during the ED visit at  week.  Questionable significance.  That plus the uncertain nature of symptoms warrants upper endoscopy.  If no explanation there, then I will plan for a surgical consultation to consider cholecystectomy.  She was agreeable to upper endoscopy after the risks and benefits were reviewed.  The benefits and risks of the planned procedure were described in detail with the patient or (when appropriate) their health care proxy.  Risks were outlined as including, but not limited to, bleeding, infection, perforation, adverse medication reaction leading to cardiac or pulmonary decompensation, pancreatitis (if ERCP).  The limitation of incomplete mucosal visualization was also discussed.  No guarantees or warranties were given.  Screening colonoscopy offered at the same time, but that would have required a date further into the future to do both procedures same day, and she preferred to get the upper endoscopy first.  Thank you for the courtesy of this consult.  Please call me with any questions or concerns.  Charlie Pitter III  CC: Referring provider noted above

## 2020-01-03 NOTE — Patient Instructions (Signed)
If you are age 55 or older, your body mass index should be between 23-30. Your Body mass index is 36.96 kg/m. If this is out of the aforementioned range listed, please consider follow up with your Primary Care Provider.  If you are age 33 or younger, your body mass index should be between 19-25. Your Body mass index is 36.96 kg/m. If this is out of the aformentioned range listed, please consider follow up with your Primary Care Provider.   You have been scheduled for an endoscopy. Please follow written instructions given to you at your visit today. If you use inhalers (even only as needed), please bring them with you on the day of your procedure.   It was a pleasure to see you today!  Dr. Myrtie Neither

## 2020-01-07 ENCOUNTER — Other Ambulatory Visit: Payer: Self-pay

## 2020-01-07 ENCOUNTER — Encounter: Payer: Self-pay | Admitting: Gastroenterology

## 2020-01-07 ENCOUNTER — Ambulatory Visit (AMBULATORY_SURGERY_CENTER): Payer: Managed Care, Other (non HMO) | Admitting: Gastroenterology

## 2020-01-07 ENCOUNTER — Other Ambulatory Visit: Payer: Self-pay | Admitting: Gastroenterology

## 2020-01-07 VITALS — BP 98/59 | HR 98 | Temp 97.5°F | Resp 23 | Ht 67.0 in | Wt 236.0 lb

## 2020-01-07 DIAGNOSIS — K31819 Angiodysplasia of stomach and duodenum without bleeding: Secondary | ICD-10-CM

## 2020-01-07 DIAGNOSIS — K259 Gastric ulcer, unspecified as acute or chronic, without hemorrhage or perforation: Secondary | ICD-10-CM | POA: Diagnosis not present

## 2020-01-07 DIAGNOSIS — R1013 Epigastric pain: Secondary | ICD-10-CM

## 2020-01-07 DIAGNOSIS — B9681 Helicobacter pylori [H. pylori] as the cause of diseases classified elsewhere: Secondary | ICD-10-CM

## 2020-01-07 DIAGNOSIS — K2970 Gastritis, unspecified, without bleeding: Secondary | ICD-10-CM | POA: Diagnosis present

## 2020-01-07 DIAGNOSIS — K2950 Unspecified chronic gastritis without bleeding: Secondary | ICD-10-CM

## 2020-01-07 MED ORDER — SODIUM CHLORIDE 0.9 % IV SOLN
500.0000 mL | Freq: Once | INTRAVENOUS | Status: DC
Start: 2020-01-07 — End: 2020-01-07

## 2020-01-07 NOTE — Progress Notes (Signed)
Called to room to assist during endoscopic procedure.  Patient ID and intended procedure confirmed with present staff. Received instructions for my participation in the procedure from the performing physician.  

## 2020-01-07 NOTE — Progress Notes (Signed)
Robinul 0.1 mg IV given due large amount of secretions upon assessment.  MD made aware, vss 

## 2020-01-07 NOTE — Progress Notes (Signed)
Report given to PACU, vss 

## 2020-01-07 NOTE — Progress Notes (Signed)
Pt's states no medical or surgical changes since previsit or office visit. 

## 2020-01-07 NOTE — Patient Instructions (Signed)
Handouts given:  Gastritis Resume previous diet Continue present medications Await pathology results Perform a RUQ ultrasound at appointment to be scheduled.   YOU HAD AN ENDOSCOPIC PROCEDURE TODAY AT THE [facility]:   Refer to the procedure report that was given to you for any specific questions about what was found during the examination.  If the procedure report does not answer your questions, please call your gastroenterologist to clarify.  If you requested that your care partner not be given the details of your procedure findings, then the procedure report has been included in a sealed envelope for you to review at your convenience later.  YOU SHOULD EXPECT: Some feelings of bloating in the abdomen. Passage of more gas than usual.  Walking can help get rid of the air that was put into your GI tract during the procedure and reduce the bloating. If you had a lower endoscopy (such as a colonoscopy or flexible sigmoidoscopy) you may notice spotting of blood in your stool or on the toilet paper. If you underwent a bowel prep for your procedure, you may not have a normal bowel movement for a few days.  Please Note:  You might notice some irritation and congestion in your nose or some drainage.  This is from the oxygen used during your procedure.  There is no need for concern and it should clear up in a day or so.  SYMPTOMS TO REPORT IMMEDIATELY:    Following upper endoscopy (EGD)  Vomiting of blood or coffee ground material  New chest pain or pain under the shoulder blades  Painful or persistently difficult swallowing  New shortness of breath  Fever of 100F or higher  Black, tarry-looking stools  For urgent or emergent issues, a gastroenterologist can be reached at any hour by calling [phone]. Do not use MyChart messaging for urgent concerns.    DIET:  We do recommend a small meal at first, but then you may proceed to your regular diet.  Drink plenty of fluids but  you should avoid alcoholic beverages for 24 hours.  ACTIVITY:  You should plan to take it easy for the rest of today and you should NOT DRIVE or use heavy machinery until tomorrow (because of the sedation medicines used during the test).    FOLLOW UP: Our staff will call the number listed on your records 48-72 hours following your procedure to check on you and address any questions or concerns that you may have regarding the information given to you following your procedure. If we do not reach you, we will leave a message.  We will attempt to reach you two times.  During this call, we will ask if you have developed any symptoms of COVID 19. If you develop any symptoms (ie: fever, flu-like symptoms, shortness of breath, cough etc.) before then, please call [phone].  If you test positive for Covid 19 in the 2 weeks post procedure, please call and report this information to [place].    If any biopsies were taken you will be contacted by phone or by letter within the next 1-3 weeks.  Please call us at [phone] if you have not heard about the biopsies in 3 weeks.    SIGNATURES/CONFIDENTIALITY: You and/or your care partner have signed paperwork which will be entered into your electronic medical record.  These signatures attest to the fact that that the information above on your After Visit Summary has been reviewed and is understood.  Full responsibility of the  confidentiality of this discharge information lies with you and/or your care-partner. 

## 2020-01-07 NOTE — Op Note (Signed)
[facility] Patient Name: Kathryn Delgado Procedure Date: 01/07/2020 11:57 AM MRN: [id number] Endoscopist: Sherilyn Cooter L. Myrtie Neither , MD Age: 54 Referring MD:  Date of Birth: 1965-08-20 Gender: Female Account #: [id number] Procedure:                Upper GI endoscopy Indications:              Epigastric abdominal pain (recent office consult                            note with details) Medicines:                Monitored Anesthesia Care Procedure:                Pre-Anesthesia Assessment:                           - Prior to the procedure, a History and Physical                            was performed, and patient medications and                            allergies were reviewed. The patient's tolerance of                            previous anesthesia was also reviewed. The risks                            and benefits of the procedure and the sedation                            options and risks were discussed with the patient.                            All questions were answered, and informed consent                            was obtained. Prior Anticoagulants: The patient has                            taken no previous anticoagulant or antiplatelet                            agents. ASA Grade Assessment: II - A patient with                            mild systemic disease. After reviewing the risks                            and benefits, the patient was deemed in                            satisfactory condition to undergo the procedure.  After obtaining informed consent, the endoscope was                            passed under direct vision. Throughout the                            procedure, the patient's blood pressure, pulse, and                            oxygen saturations were monitored continuously. The                            Endoscope was introduced through the mouth, and                            advanced to the second part of duodenum.  The upper                            GI endoscopy was accomplished without difficulty.                            The patient tolerated the procedure well. Scope In: Scope Out: Findings:                 The larynx was normal.                           The esophagus was normal.                           Diffuse mildly congested mucosa was found in the                            entire examined stomach. Biopsies were taken with a                            cold forceps for histology. ([place] protocol).                           Patchy moderately erythematous mucosa was found in                            the cardia and in the gastric fundus. Biopsies were                            taken with a cold forceps for histology.                           The exam of the stomach was otherwise normal,                            including retroflexion.                           A single diminutive angioectasia without bleeding  was found in the second portion of the duodenum.                           Two erosions without bleeding were found in the                            duodenal bulb.                           The exam of the duodenum was otherwise normal. Complications:            No immediate complications. Estimated Blood Loss:     Estimated blood loss was minimal. Impression:               - Normal larynx.                           - Normal esophagus.                           - Congestive gastropathy. Biopsied.                           - Erythematous mucosa in the cardia and gastric                            fundus. Biopsied.                           - A single non-bleeding angioectasia in the                            duodenum.                           - Duodenal erosions without bleeding.                           Findings appear unlikely to explain patient's                            symptoms. Most c/w biliary colic - gallstones seen                             on 2019 US, recent CTAP during Ed visit with                            contracted GB and no calcified stones seen. Recommendation:           - Patient has a contact number available for                            emergencies. The signs and symptoms of potential                            delayed complications were discussed with the  patient. Return to normal activities tomorrow.                            Written discharge instructions were provided to the                            patient.                           - Resume previous diet.                           - Continue present medications.                           - Await pathology results.                           - Perform a RUQ ultrasound at appointment to be                            scheduled. Shanicka Oldenkamp L. Myrtie Neither, MD 01/07/2020 12:22:25 PM This report has been signed electronically.

## 2020-01-08 ENCOUNTER — Other Ambulatory Visit: Payer: Self-pay

## 2020-01-08 ENCOUNTER — Telehealth: Payer: Self-pay | Admitting: Family Medicine

## 2020-01-08 ENCOUNTER — Telehealth: Payer: Self-pay

## 2020-01-08 ENCOUNTER — Encounter: Payer: Self-pay | Admitting: Internal Medicine

## 2020-01-08 DIAGNOSIS — K8020 Calculus of gallbladder without cholecystitis without obstruction: Secondary | ICD-10-CM

## 2020-01-08 DIAGNOSIS — R1013 Epigastric pain: Secondary | ICD-10-CM

## 2020-01-08 NOTE — Telephone Encounter (Signed)
Lm on vm for patient to return call regarding [place] appt.  Scheduled RUQ [place] for 01/15/20 at 9:15 am with a 9 am arrival time at [facility] - [address], [address]. NPO after midnight

## 2020-01-09 ENCOUNTER — Telehealth: Payer: Self-pay

## 2020-01-09 ENCOUNTER — Ambulatory Visit: Payer: Managed Care, Other (non HMO) | Admitting: Medical

## 2020-01-09 NOTE — Telephone Encounter (Signed)
No answer, left message to call back later today, B.Janyiah Silveri RN. 

## 2020-01-09 NOTE — Telephone Encounter (Signed)
error 

## 2020-01-09 NOTE — Telephone Encounter (Signed)
Lm on vm for patient to return call 

## 2020-01-09 NOTE — Telephone Encounter (Signed)
Spoke with patient, pt aware of appt for [place] on 01/15/20. Pt states that she works and does not get off until the early morning. I provided patient with the phone number to [facility] if she needed to reschedule at her convenience.

## 2020-01-09 NOTE — Telephone Encounter (Signed)
No answer, left message to call if having any issues or concerns, B.Lamari Beckles RN.

## 2020-01-10 ENCOUNTER — Telehealth: Payer: Self-pay

## 2020-01-10 ENCOUNTER — Telehealth: Payer: Self-pay | Admitting: Gastroenterology

## 2020-01-10 ENCOUNTER — Other Ambulatory Visit: Payer: Self-pay

## 2020-01-10 MED ORDER — OMEPRAZOLE 20 MG PO CPDR: 20.0000 mg | DELAYED_RELEASE_CAPSULE | Freq: Two times a day (BID) | ORAL | 0 refills | Status: DC

## 2020-01-10 MED ORDER — DOXYCYCLINE HYCLATE 100 MG PO TBEC: 100.0000 mg | DELAYED_RELEASE_TABLET | Freq: Two times a day (BID) | ORAL | 0 refills | Status: DC

## 2020-01-10 MED ORDER — OMEPRAZOLE 20 MG PO CPDR
20.0000 mg | DELAYED_RELEASE_CAPSULE | Freq: Two times a day (BID) | ORAL | 0 refills | Status: DC
Start: 2020-01-10 — End: 2020-01-10

## 2020-01-10 MED ORDER — BISMUTH 262 MG PO CHEW: 524.0000 mg | CHEWABLE_TABLET | Freq: Four times a day (QID) | ORAL | 0 refills | Status: DC

## 2020-01-10 MED ORDER — METRONIDAZOLE 250 MG PO TABS
250.0000 mg | ORAL_TABLET | Freq: Four times a day (QID) | ORAL | 0 refills | Status: DC
Start: 2020-01-10 — End: 2020-01-10

## 2020-01-10 MED ORDER — METRONIDAZOLE 250 MG PO TABS: 250.0000 mg | ORAL_TABLET | Freq: Four times a day (QID) | ORAL | 0 refills | Status: DC

## 2020-01-10 NOTE — Telephone Encounter (Signed)
Scripts sent to different [facility] as requested.

## 2020-01-10 NOTE — Telephone Encounter (Signed)
Pt states that the pharmacy we called  meds into are down and they can not fill anything .  She would like it called into [facility] on [address].  Also she states that if Doxycycline needs prior authorization. IF expensive she wants something else.  Please call pt.

## 2020-01-10 NOTE — Telephone Encounter (Signed)
Incoming fax request for prior auth for doxycyline 100mg  BID #20 Per [facility] cost is 72.99. per patient she cannot afford this. Please provide alternative medication

## 2020-01-11 MED ORDER — PYLERA 140-125-125 MG PO CAPS
3.0000 | ORAL_CAPSULE | Freq: Three times a day (TID) | ORAL | 0 refills | Status: DC
Start: 2020-01-11 — End: 2020-02-27

## 2020-01-11 NOTE — Telephone Encounter (Signed)
Pt has been updated. Rx for pylera has been sent to the pharmacy. Will wait to see if insurance require a prior auth

## 2020-01-11 NOTE — Telephone Encounter (Signed)
Pharmacy has ordered the Pylera.  Patient aware to check with her pharmacy tomorrow She will let me know Monday if she has any complications picking it up!

## 2020-01-11 NOTE — Telephone Encounter (Signed)
I am afraid there is no specific alternative for the doxycycline.  (tetracycline is on national backorder)  Please speak with her pharmacist and see if they can determine if the patient's insurance will cover Pylera, which is a branded H pylori treatment containing all three of the antibiotics I had prescribed.  It is typically not covered.  If it happens to be, and is at a lower overall cost than the separate prescriptions for bismuth, metronidazole and doxycycline, then that would be fine.  - HD

## 2020-01-15 ENCOUNTER — Other Ambulatory Visit: Payer: Managed Care, Other (non HMO)

## 2020-01-21 ENCOUNTER — Telehealth: Payer: Self-pay | Admitting: Gastroenterology

## 2020-01-21 ENCOUNTER — Ambulatory Visit
Admission: RE | Admit: 2020-01-21 | Discharge: 2020-01-21 | Disposition: A | Discharge status: Home / Self Care | Payer: Managed Care, Other (non HMO) | Source: Ambulatory Visit | Attending: Gastroenterology | Admitting: Gastroenterology

## 2020-01-21 DIAGNOSIS — K8020 Calculus of gallbladder without cholecystitis without obstruction: Secondary | ICD-10-CM

## 2020-01-21 DIAGNOSIS — R1013 Epigastric pain: Secondary | ICD-10-CM

## 2020-01-21 MED ORDER — FLUCONAZOLE 100 MG PO TABS
100.0000 mg | ORAL_TABLET | Freq: Every day | ORAL | 0 refills | Status: AC
Start: 2020-01-21 — End: 2020-01-22

## 2020-01-21 NOTE — Telephone Encounter (Signed)
Spoke with pt and she is aware, states she will try to finish the meds.

## 2020-01-21 NOTE — Telephone Encounter (Signed)
Please advise 

## 2020-01-21 NOTE — Telephone Encounter (Signed)
Sorry to hear she is having trouble.  Yes, the side effects can sometimes be significant. She should be about finished with the 10 day treatment course, and I would like her to try to finish it as best she can.  This bacteria only responds to a small number of antibiotics, so we don't have many choices.  I will also send in a prescription for one dose of fluconazole, which will typically clear up a vaginal yeast infection.

## 2020-01-21 NOTE — Telephone Encounter (Signed)
Pt states she is taking Pylera but it is making her nauseated and she cannot eat. Reports when she does eat a small amount of food her stomach hurts. She tried to eat a little bit yesterday and her stomach hurt and burned. She started taking it on 01/11/20, prescription was sent in for 10 days. Pt also states she has a yeast infection. Please advise.

## 2020-01-22 NOTE — Telephone Encounter (Signed)
See result note.  

## 2020-01-22 NOTE — Telephone Encounter (Signed)
Pt is requesting a call back from Linda 

## 2020-01-23 ENCOUNTER — Other Ambulatory Visit: Payer: Managed Care, Other (non HMO)

## 2020-01-24 ENCOUNTER — Other Ambulatory Visit: Payer: Self-pay

## 2020-01-24 ENCOUNTER — Other Ambulatory Visit: Payer: Managed Care, Other (non HMO)

## 2020-01-24 DIAGNOSIS — Z9189 Other specified personal risk factors, not elsewhere classified: Secondary | ICD-10-CM

## 2020-01-24 DIAGNOSIS — I700 Atherosclerosis of aorta: Secondary | ICD-10-CM

## 2020-01-24 DIAGNOSIS — E7800 Pure hypercholesterolemia, unspecified: Secondary | ICD-10-CM

## 2020-01-25 LAB — COMPREHENSIVE METABOLIC PANEL
ALT: 34 IU/L — ABNORMAL HIGH (ref 0–32)
AST: 24 IU/L (ref 0–40)
Albumin/Globulin Ratio: 1.7 (ref 1.2–2.2)
Albumin: 4 g/dL (ref 3.8–4.9)
Alkaline Phosphatase: 82 IU/L (ref 48–121)
BUN/Creatinine Ratio: 8 — ABNORMAL LOW (ref 9–23)
BUN: 5 mg/dL — ABNORMAL LOW (ref 6–24)
Bilirubin Total: 0.5 mg/dL (ref 0.0–1.2)
CO2: 21 mmol/L (ref 20–29)
Calcium: 9.1 mg/dL (ref 8.7–10.2)
Chloride: 107 mmol/L — ABNORMAL HIGH (ref 96–106)
Creatinine, Ser: 0.62 mg/dL (ref 0.57–1.00)
GFR calc Af Amer: 118 mL/min/{1.73_m2} (ref >59)
GFR calc non Af Amer: 103 mL/min/{1.73_m2} (ref >59)
Globulin, Total: 2.4 g/dL (ref 1.5–4.5)
Glucose: 102 mg/dL — ABNORMAL HIGH (ref 65–99)
Potassium: 4 mmol/L (ref 3.5–5.2)
Sodium: 143 mmol/L (ref 134–144)
Total Protein: 6.4 g/dL (ref 6.0–8.5)

## 2020-01-25 LAB — LIPID PANEL
Chol/HDL Ratio: 2.9 ratio (ref 0.0–4.4)
Cholesterol, Total: 108 mg/dL (ref 100–199)
HDL: 37 mg/dL — ABNORMAL LOW (ref >39)
LDL Chol Calc (NIH): 57 mg/dL (ref 0–99)
Triglycerides: 65 mg/dL (ref 0–149)
VLDL Cholesterol Cal: 14 mg/dL (ref 5–40)

## 2020-02-04 ENCOUNTER — Encounter: Payer: Self-pay | Admitting: Family Medicine

## 2020-02-07 ENCOUNTER — Telehealth: Payer: Self-pay

## 2020-02-07 MED ORDER — FAMOTIDINE 40 MG PO TABS
40.0000 mg | ORAL_TABLET | Freq: Every day | ORAL | 0 refills | Status: DC
Start: 2020-02-07 — End: 2020-02-27

## 2020-02-07 NOTE — Telephone Encounter (Signed)
Pt. Called stating she needs a refill on her famotidine sent in to the [facility] on [address] next apt is 03/07/20.

## 2020-02-07 NOTE — Telephone Encounter (Signed)
Ok to refill 

## 2020-02-07 NOTE — Telephone Encounter (Signed)
refilled 

## 2020-02-07 NOTE — Telephone Encounter (Signed)
Ok to refill or does pt need to get this from GI

## 2020-02-13 ENCOUNTER — Other Ambulatory Visit: Payer: Self-pay | Admitting: General Surgery

## 2020-02-14 ENCOUNTER — Encounter: Payer: Self-pay | Admitting: Family Medicine

## 2020-02-14 ENCOUNTER — Telehealth: Payer: Self-pay | Admitting: Gastroenterology

## 2020-02-14 ENCOUNTER — Telehealth (INDEPENDENT_AMBULATORY_CARE_PROVIDER_SITE_OTHER): Payer: Managed Care, Other (non HMO) | Admitting: Family Medicine

## 2020-02-14 ENCOUNTER — Other Ambulatory Visit: Payer: Self-pay

## 2020-02-14 ENCOUNTER — Telehealth: Payer: Managed Care, Other (non HMO) | Admitting: Family Medicine

## 2020-02-14 DIAGNOSIS — R112 Nausea with vomiting, unspecified: Secondary | ICD-10-CM | POA: Diagnosis not present

## 2020-02-14 DIAGNOSIS — R1010 Upper abdominal pain, unspecified: Secondary | ICD-10-CM

## 2020-02-14 MED ORDER — PROCHLORPERAZINE 25 MG RE SUPP: 25.0000 mg | Freq: Two times a day (BID) | RECTAL | 0 refills | Status: DC | PRN

## 2020-02-14 MED ORDER — PROMETHAZINE HCL 25 MG PO TABS: 25.0000 mg | ORAL_TABLET | Freq: Three times a day (TID) | ORAL | 0 refills | Status: DC | PRN

## 2020-02-14 NOTE — Telephone Encounter (Signed)
Spoke with patient regarding recommendations, pt is aware that an RX for compazine suppositories were sent to her pharmacy, pt asked where the suppositories went and I explained to her that they are rectal suppositories because she stated that she was unable to tolerate liquids or solid foods. Pt states that she was not going to use the rectal suppositories and that she spoke with her PCP and she said that she had sent her a prescription in. I asked was it the prescription for Zofran because I didn't see another prescription, she states "I don't know, look in My Chart." I stated to give me a minute to see if I could see the note from her PCP, she is sending in Phenergan for patient, advised patient that the prescription was still in a tablet form. Advised patient that if she is able to tolerate the oral meds that would be fine but the suppositories were at the pharmacy if she wanted to use them. Advised patient to call surgeons office early next week and to report her symptoms and see how soon she can be scheduled for surgery. Advised patient to give [place] a call if she had any other questions.

## 2020-02-14 NOTE — Telephone Encounter (Signed)
Spoke with patient and significant other, she reports constant vomiting - losing a lot of time off of work - needs work nore, lost over 40 lbs since May, unable to tolerate liquids and foods. Pt states that she is vomiting up bile, has not had a bowel movement. She reports that she had a consultation for surgery yesterday, not yet scheduled due to having to get pre-authorization through insurance. Advised pt that she needed to go to the ED since she is unable to tolerate anything, for IV fluids, etc. They stated that they waited and spent a lot of money out of pocket the last time and would not like to have to do that again. Abdominal US done on 01/21/20. Please advise, thank you!

## 2020-02-14 NOTE — Progress Notes (Signed)
   Subjective:  Documentation for virtual audio and video telecommunications through caregility encounter:  The patient was located at home. 2 patient identifiers used.  The provider was located in the office. The patient did consent to this visit and is aware of possible charges through their insurance for this visit.  The other persons participating in this telemedicine service were none. Time spent on call was 14 minutes and in review of previous records 15 minutes total.  This virtual service is not related to other E/M service within previous 7 days.   Patient ID: Kathryn Delgado, female    DOB: 1966-02-26, 54 y.o.   MRN: [id number]  HPI Chief Complaint  Patient presents with  . vomitting and stomach cramps    vomitting and stomach cramps. started last night.    Complains of a 12-18 hour history of upper abdominal pain, nausea and vomiting. States she feels dizzy at times and her urine is dark. Vomiting bile. No blood.  States she has temporary relief after vomiting.   States Zofran has not helped.  She is being followed by GI or gallbladder disease.   States she drank some alcohol over the weekend.   No fever, chills, chest pain, shortness of breath, diarrhea or abdominal pain.    Review of Systems Pertinent positives and negatives in the history of present illness.     Objective:   Physical Exam There were no vitals taken for this visit.  Alert and oriented and in no acute distress. Respirations unlabored. Normal speech, mood, thought process.       Assessment & Plan:  Nausea and vomiting, intractability of vomiting not specified, unspecified vomiting type - Plan: DISCONTINUED: promethazine (PHENERGAN) 25 MG tablet  Upper abdominal pain  Discussed clear liquids for now and pushing fluids to prevent dehydration.  I will prescribe promethazine since Zofran is not helping. Discussed that her pain may be related to her gallbladder or GERD. Follow up with GI

## 2020-02-14 NOTE — Telephone Encounter (Signed)
This patient has severe biliary colic and needs her gallbladder removed as soon as possible, so hopefully that authorization will come very soon via the surgeon's office.  I will prescribe her some compazine suppositories to help control the vomiting for when she cannot keep down oral meds.  But it is only temporary relief until gallbladder is removed, which I am afraid I cannot expedite. I encourage her to contact her surgeon early next week to report on her symptoms and inquire about how soon surgery can be scheduled.

## 2020-02-15 NOTE — Progress Notes (Signed)
° °  Subjective:    Patient ID: Kathryn Delgado, female    DOB: 1966/05/24, 54 y.o.   MRN: [id number]  HPI  Visit started incorrectly and had to be restarted. See new note on same day   Review of Systems     Objective:   Physical Exam        Assessment & Plan:

## 2020-02-27 ENCOUNTER — Other Ambulatory Visit: Payer: Self-pay

## 2020-02-27 ENCOUNTER — Encounter: Payer: Self-pay | Admitting: Family Medicine

## 2020-02-27 ENCOUNTER — Ambulatory Visit: Payer: Managed Care, Other (non HMO) | Admitting: Family Medicine

## 2020-02-27 VITALS — BP 120/80 | HR 83 | Temp 98.0°F | Wt 226.0 lb

## 2020-02-27 DIAGNOSIS — T148XXA Other injury of unspecified body region, initial encounter: Secondary | ICD-10-CM | POA: Diagnosis not present

## 2020-02-27 NOTE — Patient Instructions (Addendum)
Keep an eye on the areas and if you develop more or if these are not going away in the next few days, let me know   Hematoma A hematoma is a collection of blood. A hematoma can happen:  Under the skin.  In an organ.  In a body space.  In a joint space.  In other tissues. The blood can thicken (clot) to form a lump that you can see and feel. The lump is often hard and may become sore and tender. The lump can be very small or very big. Most hematomas get better in a few days to weeks. However, some hematomas may be serious and need medical care. What are the causes? This condition is caused by:  An injury.  Blood that leaks under the skin.  Problems from surgeries.  Medical conditions that cause bleeding or bruising. What increases the risk? You are more likely to develop this condition if:  You are an older adult.  You use medicines that thin your blood. What are the signs or symptoms? Symptoms depend on where the hematoma is in your body.  If the hematoma is under the skin, there is: ? A firm lump on the body. ? Pain and tenderness in the area. ? Bruising. The skin above the lump may be blue, dark blue, purple-red, or yellowish.  If the hematoma is deep in the tissues or body spaces, there may be: ? Blood in the stomach. This may cause pain in the belly (abdomen), weakness, passing out (fainting), and shortness of breath. ? Blood in the head. This may cause a headache, weakness, trouble speaking or understanding speech, or passing out. How is this diagnosed? This condition is diagnosed based on:  Your medical history.  A physical exam.  Imaging tests, such as ultrasound or CT scan.  Blood tests. How is this treated? Treatment depends on the cause, size, and location of the hematoma. Treatment may include:  Doing nothing. Many hematomas go away on their own without treatment.  Surgery or close monitoring. This may be needed for large hematomas or hematomas  that affect the body's organs.  Medicines. These may be given if a medical condition caused the hematoma. Follow these instructions at home: Managing pain, stiffness, and swelling   If told, put ice on the area. ? Put ice in a plastic bag. ? Place a towel between your skin and the bag. ? Leave the ice on for 20 minutes, 2-3 times a day for the first two days.  If told, put heat on the affected area after putting ice on the area for two days. Use the heat source that your doctor tells you to use. This could be a moist heat pack or a heating pad. To do this: ? Place a towel between your skin and the heat source. ? Leave the heat on for 20-30 minutes. ? Remove the heat if your skin turns bright red. This is very important if you are unable to feel pain, heat, or cold. You may have a greater risk of getting burned.  Raise (elevate) the affected area above the level of your heart while you are sitting or lying down.  Wrap the affected area with an elastic bandage, if told by your doctor. Do not wrap the bandage too tightly.  If your hematoma is on a leg or foot and is painful, your doctor may give you crutches. Use them as told by your doctor. General instructions  Take over-the-counter and prescription medicines  only as told by your doctor.  Keep all follow-up visits as told by your doctor. This is important. Contact a doctor if:  You have a fever.  The swelling or bruising gets worse.  You start to get more hematomas. Get help right away if:  Your pain gets worse.  Your pain is not getting better with medicine.  Your skin over the hematoma breaks or starts to bleed.  Your hematoma is in your chest or belly and you: ? Pass out. ? Feel weak. ? Become short of breath.  You have a hematoma on your scalp that is caused by a fall or injury, and you: ? Have a headache that gets worse. ? Have trouble speaking or understanding speech. ? Become less alert or you pass  out. Summary  A hematoma is a collection of blood in any part of your body.  Most hematomas get better on their own in a few days to weeks. Some may need medical care.  Follow instructions from your doctor about how to care for your hematoma.  Contact a doctor if the swelling or bruising gets worse, or if you are short of breath. This information is not intended to replace advice given to you by your health care provider. Make sure you discuss any questions you have with your health care provider. Document Revised: 10/27/2017 Document Reviewed: 10/27/2017 Elsevier Patient Education  2020 [employer].

## 2020-02-27 NOTE — Progress Notes (Signed)
° °  Subjective:    Patient ID: Kathryn Delgado, female    DOB: [birth year], 54 y.o.   MRN: [id number]  HPI Chief Complaint  Patient presents with   rough spots on hands    rough spots on hands   She is here with complaints of 2 raised areas on the palm of her left hand that were larger and tender to palpation but are improving.  These occurred shortly after having gallbladder surgery last week.  She denies any injury.  No numbness, tingling or weakness of her hands.  States she has good range of motion and function of her hands. No other symptoms currently. No other skin concerns.   No fever, chills, abdominal pain other than soreness of gallbladder incision and no N/V/D.      Review of Systems Pertinent positives and negatives in the history of present illness.     Objective:   Physical Exam BP 120/80    Pulse 83    Temp 98 F (36.7 C)    Wt 226 lb (102.5 kg)    BMI 35.40 kg/m   Hematoma to palmar surface of left hand, non tender. She has a smaller one as well. Normal sensation, ROM and strength of her hand and wrist. LUE is neurovascularly intact.       Assessment & Plan:  Hematoma  Discussed that the area looks like she may have injured it at some point but does not recall.  No other symptoms.  She reports these areas are improving.  We will continue to keep a close eye on these and she will let me know if she notices any new areas or if these do not completely resolve.  I also had Dr. Susann Givens examine her and he agrees with this plan.

## 2020-03-02 ENCOUNTER — Other Ambulatory Visit: Payer: Self-pay | Admitting: Family Medicine

## 2020-03-07 ENCOUNTER — Encounter: Payer: Managed Care, Other (non HMO) | Admitting: Family Medicine

## 2020-05-05 ENCOUNTER — Other Ambulatory Visit: Payer: Self-pay | Admitting: Family Medicine

## 2020-06-08 ENCOUNTER — Other Ambulatory Visit: Payer: Self-pay | Admitting: Family Medicine

## 2020-06-08 DIAGNOSIS — I700 Atherosclerosis of aorta: Secondary | ICD-10-CM

## 2020-06-08 DIAGNOSIS — Z9189 Other specified personal risk factors, not elsewhere classified: Secondary | ICD-10-CM

## 2020-06-08 DIAGNOSIS — E7800 Pure hypercholesterolemia, unspecified: Secondary | ICD-10-CM

## 2020-06-09 ENCOUNTER — Other Ambulatory Visit: Payer: Self-pay | Admitting: Family Medicine

## 2020-07-01 DIAGNOSIS — G8929 Other chronic pain: Secondary | ICD-10-CM | POA: Insufficient documentation

## 2020-12-15 ENCOUNTER — Other Ambulatory Visit: Payer: Self-pay | Admitting: Family Medicine

## 2020-12-15 ENCOUNTER — Telehealth: Payer: Self-pay | Admitting: Family Medicine

## 2020-12-15 MED ORDER — ENALAPRIL MALEATE 5 MG PO TABS: ORAL_TABLET | ORAL | 0 refills | Status: DC

## 2020-12-15 NOTE — Telephone Encounter (Signed)
Sent in 30 days and sent mychart message to schedule an appointment

## 2020-12-15 NOTE — Telephone Encounter (Signed)
Pt called for refills on enalapril. Please send to [facility] on [address].

## 2020-12-16 ENCOUNTER — Ambulatory Visit: Payer: Managed Care, Other (non HMO) | Admitting: Medical

## 2020-12-16 ENCOUNTER — Encounter: Payer: Self-pay | Admitting: Medical

## 2020-12-16 ENCOUNTER — Other Ambulatory Visit: Payer: Self-pay

## 2020-12-16 ENCOUNTER — Encounter: Payer: Self-pay | Admitting: Family Medicine

## 2020-12-16 VITALS — BP 112/64 | HR 107 | Wt 249.0 lb

## 2020-12-16 DIAGNOSIS — M25511 Pain in right shoulder: Secondary | ICD-10-CM | POA: Diagnosis not present

## 2020-12-16 DIAGNOSIS — R29898 Other symptoms and signs involving the musculoskeletal system: Secondary | ICD-10-CM | POA: Insufficient documentation

## 2020-12-16 DIAGNOSIS — M25532 Pain in left wrist: Secondary | ICD-10-CM

## 2020-12-16 DIAGNOSIS — M79645 Pain in left finger(s): Secondary | ICD-10-CM

## 2020-12-16 DIAGNOSIS — M25531 Pain in right wrist: Secondary | ICD-10-CM | POA: Diagnosis not present

## 2020-12-16 DIAGNOSIS — I10 Essential (primary) hypertension: Secondary | ICD-10-CM

## 2020-12-16 DIAGNOSIS — F17200 Nicotine dependence, unspecified, uncomplicated: Secondary | ICD-10-CM

## 2020-12-16 DIAGNOSIS — M25512 Pain in left shoulder: Secondary | ICD-10-CM

## 2020-12-16 DIAGNOSIS — M25432 Effusion, left wrist: Secondary | ICD-10-CM | POA: Diagnosis not present

## 2020-12-16 DIAGNOSIS — M79644 Pain in right finger(s): Secondary | ICD-10-CM

## 2020-12-16 MED ORDER — DICLOFENAC SODIUM 75 MG PO TBEC: 75.0000 mg | DELAYED_RELEASE_TABLET | Freq: Two times a day (BID) | ORAL | 0 refills | Status: DC

## 2020-12-16 NOTE — Patient Instructions (Addendum)
Symptoms suggest possible inflammation versus arthritis vs other underlying rheumatoid condition  I would like to check some labs to evaluate for inflammation /screen for rheumatoid disease  Recommendations for short term: Rest when possible Ice therapy such as bag of frozen peas 20 minutes at a time Aleve OTC 1-2 tablets once or twice daily for up to a week for now Consider arm sling over the counter for left arm or wrist splint since the forearm and wrist is swollen One consideration given the swelling, wrist and hand involvement and shoulder involvement is something called polymyalgia rheumatica.   This is part of why we will check some labs.  This is an inflammatory condition that affects people typically over 50, involves shoulders, sometimes hips, and can be associated with the other joint pain and swelling like in the forearms and hands.    The big difference in plain ole arthritis and polymyalgia rheumatica, is polymyalgia is inflammatory and usually requires steroids like prednisone for months up to a year, versus short term treatment like Aleve.     I included a handout on this below for further reading in the event labs suggest this condition, which  is different than regular ole arthritis.     Polymyalgia Rheumatica Polymyalgia rheumatica (PMR) is an inflammatory disorder that causes the muscles and joints to ache and become stiff. Sometimes, PMR leads to a more dangerous condition that can cause vision loss (temporal arteritis or giant cell arteritis). What are the causes? The exact cause of PMR is not known. What increases the risk? You are more likely to develop this condition if you are: Female. 56 years of age or older. Caucasian. What are the signs or symptoms? Pain and stiffness are the main symptoms of PMR. Symptoms may: Be worse after inactivity and in the morning. Affect your: Hips, buttocks, and thighs. Neck, arms, and shoulders. This can make it hard to raise  your arms above your head. Hands and wrists. Other symptoms include: Fever. Tiredness. Weakness. Depression. Decreased appetite. This may lead to weight loss. Symptoms may start slowly or suddenly. How is this diagnosed? This condition is diagnosed with your medical history and a physical exam. You may need to see a health care provider who specializes in diseases of the joints, muscles, and bones (rheumatologist). You may also have tests, including: Blood tests. X-rays. Ultrasound. How is this treated? PMR usually goes away without treatment, but it may take years. Your health care provider may recommend low-dose steroids and other medicines to help manage your symptoms of pain and stiffness. Regular exercise and rest will alsohelp your symptoms. Follow these instructions at home:  Take over-the-counter and prescription medicines only as told by your health care provider. Make sure to get enough rest and sleep. Eat a healthy and nutritious diet. Try to exercise most days of the week. Ask your health care provider what type of exercise is best for you. Keep all follow-up visits as told by your health care provider. This is important. Contact a health care provider if: Your symptoms do not improve with medicine. You have side effects from steroids. These may include: Weight gain. Swelling. Insomnia. Mood changes. Bruising. High blood sugar readings, if you have diabetes. Higher than normal blood pressure readings, if you monitor your blood pressure. Get help right away if: You develop symptoms of temporal arteritis, such as: A change in vision. Severe headache. Scalp pain. Jaw pain. Summary Polymyalgia rheumatica is an inflammatory disorder that causes aching and stiffness in  your muscles and joints. The exact cause of this condition is not known. This condition usually goes away without treatment. Your health care provider may give you low-dose steroids to help manage your  pain and stiffness. Rest and regular exercise will help the symptoms. This information is not intended to replace advice given to you by your health care provider. Make sure you discuss any questions you have with your healthcare provider. Document Revised: 03/30/2018 Document Reviewed: 03/30/2018 Elsevier Patient Education  2022 [employer].

## 2020-12-16 NOTE — Progress Notes (Signed)
Subjective:  Kathryn Delgado is a 55 y.o. female who presents for Chief Complaint  Patient presents with   Shoulder Pain    Shoulder pain- across shoulder. Been going on for a month. Not work related.      Here for right shoulder pain, shoulder blade region x 1 month.   Both shoulders hurt across the back and front.   Has used ice, ointment topically.  Lifts boxes at work regularly, repetitively.   left handed? She notes swelling of some of hand knuckles and left forearm.  Hurts to raise arms with flexion.   No recent injury or trauma?  She feels some weakness in the left arm.   No numbness or tingling.  Has trouble fully bending left middle finger.  No prior big injuries to this shoulder.  No prior chronic pain in arms.   Back hurts sometimes in upper back.  No neck pain.  No family hx/o rheumatoid conditions, no personal hx/o gout.   No other aggravating or relieving factors.    No other c/o.  Past Medical History:  Diagnosis Date   Alcohol use    Aortic atherosclerosis (HCC) 12/06/2019   Arthritis    self report   Gallstones 05/08/2018   Found on [place]   Hyperlipidemia    Hypertension    Liver lesion 12/06/2019   Current Outpatient Medications on File Prior to Visit  Medication Sig Dispense Refill   enalapril (VASOTEC) 5 MG tablet TAKE 1 TABLET(5 MG) BY MOUTH DAILY 30 tablet 0   No current facility-administered medications on file prior to visit.   Family History  Problem Relation Age of Onset   Diabetes Mother    Thyroid disease Sister    Diabetes Sister    Thyroid disease Sister    Diabetes Brother    Colon polyps Neg Hx    Colon cancer Neg Hx    Esophageal cancer Neg Hx    Rectal cancer Neg Hx    Stomach cancer Neg Hx      The following portions of the patient's history were reviewed and updated as appropriate: allergies, current medications, past family history, past medical history, past social history, past surgical history and problem list.  ROS Otherwise as in  subjective above  Objective: BP 112/64   Pulse (!) 107   Wt 249 lb (112.9 kg)   BMI 39.00 kg/m   General appearance: alert, no distress, well developed, well nourished Neck: supple, no lymphadenopathy, no thyromegaly, no masses, nontender, normal ROM Back:mild tenderness over lateral portions of bilat latissimus dorsi and lateal posterior deltoids, tender over anterior deltoids, tender over biceps and upper lateral chest bilat.  Pain noted with shoulder ROM which is about 70% of normal in general, and mild pain with passive flexion > 120 degrees bilat.  Otherwise no laxity of joint, no redness, no warmth.  Tender over left anterolateral forearm with mild localized swelling in same area, decreased flexion and ext of left wrist due to pain.   Tender over left middle finger PIP with mild decrease in flexion. Otherwise hands and arms nontender.   Pulses: 2+ radial pulses, 2+ pedal pulses, normal cap refill Neuro: normal arm strength, sensation and DTRs.  Hips nontender, normal ROM     Assessment: Encounter Diagnoses  Name Primary?   Acute pain of both shoulders Yes   Shoulder weakness    Pain in both wrists    Swelling of left wrist    Pain in finger of both hands  Essential hypertension    Smoker      Plan: Symptoms suggest possible inflammation versus arthritis vs other underlying rheumatoid condition  I would like to check some labs to evaluate for inflammation /screen for rheumatoid disease  Recommendations for short term: Rest when possible Ice therapy such as bag of frozen peas 20 minutes at a time Aleve OTC 1-2 tablets once or twice daily for up to a week for now Consider arm sling over the counter for left arm or wrist splint since the forearm and wrist is swollen One consideration given the swelling, wrist and hand involvement and shoulder involvement is something called polymyalgia rheumatica.   This is part of why we will check some labs.  This is an inflammatory  condition that affects people typically over 50, involves shoulders, sometimes hips, and can be associated with the other joint pain and swelling like in the forearms and hands.    The big difference in plain ole arthritis and polymyalgia rheumatica, is polymyalgia is inflammatory and usually requires steroids like prednisone for months up to a year, versus short term treatment like Aleve.     Note give for work today.      HTN - c/t current medication  Tobacco use aggravates the inflammation.   Needs to stop tobacco use.  [name] was seen today for shoulder pain.  Diagnoses and all orders for this visit:  Acute pain of both shoulders -     Sedimentation rate -     CYCLIC CITRUL PEPTIDE ANTIBODY, IGG/IGA -     Uric acid -     Rheumatoid factor  Shoulder weakness -     Sedimentation rate -     CYCLIC CITRUL PEPTIDE ANTIBODY, IGG/IGA -     Uric acid -     Rheumatoid factor  Pain in both wrists -     Sedimentation rate -     CYCLIC CITRUL PEPTIDE ANTIBODY, IGG/IGA -     Uric acid -     Rheumatoid factor  Swelling of left wrist -     Sedimentation rate -     CYCLIC CITRUL PEPTIDE ANTIBODY, IGG/IGA -     Uric acid -     Rheumatoid factor  Pain in finger of both hands -     Sedimentation rate -     CYCLIC CITRUL PEPTIDE ANTIBODY, IGG/IGA -     Uric acid -     Rheumatoid factor  Essential hypertension  Smoker  Other orders -     diclofenac (VOLTAREN) 75 MG EC tablet; Take 1 tablet (75 mg total) by mouth 2 (two) times daily.   Follow up: pending labs

## 2020-12-17 ENCOUNTER — Other Ambulatory Visit: Payer: Self-pay | Admitting: Medical

## 2020-12-17 DIAGNOSIS — M25531 Pain in right wrist: Secondary | ICD-10-CM

## 2020-12-17 DIAGNOSIS — M25562 Pain in left knee: Secondary | ICD-10-CM

## 2020-12-17 DIAGNOSIS — M25432 Effusion, left wrist: Secondary | ICD-10-CM

## 2020-12-17 DIAGNOSIS — R768 Other specified abnormal immunological findings in serum: Secondary | ICD-10-CM

## 2020-12-17 DIAGNOSIS — M79644 Pain in right finger(s): Secondary | ICD-10-CM

## 2020-12-17 DIAGNOSIS — G8929 Other chronic pain: Secondary | ICD-10-CM

## 2020-12-17 DIAGNOSIS — M25532 Pain in left wrist: Secondary | ICD-10-CM

## 2020-12-18 LAB — URIC ACID: Uric Acid: 5.8 mg/dL (ref 3.0–7.2)

## 2020-12-18 LAB — RHEUMATOID FACTOR: Rheumatoid fact SerPl-aCnc: 214.7 IU/mL — ABNORMAL HIGH (ref <14.0)

## 2020-12-18 LAB — SEDIMENTATION RATE: Sed Rate: 43 mm/hr — ABNORMAL HIGH (ref 0–40)

## 2020-12-18 LAB — CYCLIC CITRUL PEPTIDE ANTIBODY, IGG/IGA: Cyclic Citrullin Peptide Ab: >250 units — ABNORMAL HIGH (ref 0–19)

## 2020-12-18 NOTE — Progress Notes (Signed)
Pt was advised.

## 2020-12-24 ENCOUNTER — Telehealth: Payer: Self-pay

## 2020-12-24 NOTE — Telephone Encounter (Signed)
Pt. Called stating she saw you last Tuesday and you called in Diclofenac for her shoulder pain and it is not helping at all. She also stated Rheumatology could not see her until 02/11/21. She wanted to know if there was something else you could call in for her because she needs to work and she can barely lift her arm.

## 2020-12-25 ENCOUNTER — Other Ambulatory Visit: Payer: Self-pay | Admitting: Medical

## 2020-12-25 MED ORDER — PREDNISONE 10 MG PO TABS: 10.0000 mg | ORAL_TABLET | Freq: Every day | ORAL | 0 refills | Status: DC

## 2020-12-25 NOTE — Telephone Encounter (Signed)
Pt is scheduled for 8/16 with Dr. Al Pimple. Please advise

## 2020-12-25 NOTE — Progress Notes (Signed)
Prednisone

## 2020-12-26 NOTE — Telephone Encounter (Signed)
Called and left detailed message on pt's vm as well as sent mychart

## 2021-01-06 NOTE — Progress Notes (Deleted)
Office Visit Note  Patient: Kathryn Delgado             Date of Birth: 17-Apr-1966           MRN: [id number]             PCP: Girtha Rm, NP-C Referring: Carlena Hurl, PA-C Visit Date: 01/20/2021 Occupation: @GUAROCC @  Subjective:  No chief complaint on file.   History of Present Illness: Kathryn Delgado is a 55 y.o. female ***   Activities of Daily Living:  Patient reports morning stiffness for *** {minute/hour:19697}.   Patient {ACTIONS;DENIES/REPORTS:21021675::"Denies"} nocturnal pain.  Difficulty dressing/grooming: {ACTIONS;DENIES/REPORTS:21021675::"Denies"} Difficulty climbing stairs: {ACTIONS;DENIES/REPORTS:21021675::"Denies"} Difficulty getting out of chair: {ACTIONS;DENIES/REPORTS:21021675::"Denies"} Difficulty using hands for taps, buttons, cutlery, and/or writing: {ACTIONS;DENIES/REPORTS:21021675::"Denies"}  No Rheumatology ROS completed.   PMFS History:  Patient Active Problem List   Diagnosis Date Noted   Acute pain of both shoulders 12/16/2020   Shoulder weakness 12/16/2020   Pain in both wrists 12/16/2020   Swelling of left wrist 12/16/2020   Pain in finger of both hands 12/16/2020   Hyperlipidemia 12/08/2019   10 year risk of MI or stroke < 7.5% 12/08/2019   Liver lesion 12/06/2019   Aortic atherosclerosis ([place]) 12/06/2019   Alcohol consumption heavy 12/20/2018   Gastroesophageal reflux disease 12/20/2018   Gallstones 05/08/2018   Essential hypertension 04/25/2018   Chronic pain of left knee 04/25/2018   Elevated ALT measurement 04/25/2018   Smoker 04/25/2018    Past Medical History:  Diagnosis Date   Alcohol use    Aortic atherosclerosis ([place]) 12/06/2019   Arthritis    self report   Gallstones 05/08/2018   Found on [place]   Hyperlipidemia    Hypertension    Liver lesion 12/06/2019    Family History  Problem Relation Age of Onset   Diabetes Mother    Thyroid disease Sister    Diabetes Sister    Thyroid disease Sister    Diabetes Brother     Colon polyps Neg Hx    Colon cancer Neg Hx    Esophageal cancer Neg Hx    Rectal cancer Neg Hx    Stomach cancer Neg Hx    Past Surgical History:  Procedure Laterality Date   FOOT SURGERY  2005   FOOT SURGERY Left 2009   KNEE ARTHROCENTESIS  08/2018   Social History   Social History Narrative   Not on file   Immunization History  Administered Date(s) Administered   Influenza,inj,Quad PF,6+ Mos 06/13/2015   PFIZER(Purple Top)SARS-COV-2 Vaccination 10/25/2019     Objective: Vital Signs: There were no vitals taken for this visit.   Physical Exam   Musculoskeletal Exam: ***  CDAI Exam: CDAI Score: -- Patient Global: --; Provider Global: -- Swollen: --; Tender: -- Joint Exam 01/20/2021   No joint exam has been documented for this visit   There is currently no information documented on the homunculus. Go to the Rheumatology activity and complete the homunculus joint exam.  Investigation: No additional findings.  Imaging: No results found.  Recent Labs: Lab Results  Component Value Date   WBC 4.2 12/06/2019   HGB 13.6 12/06/2019   PLT 239 12/06/2019   NA 143 01/24/2020   K 4.0 01/24/2020   CL 107 (H) 01/24/2020   CO2 21 01/24/2020   GLUCOSE 102 (H) 01/24/2020   BUN 5 (L) 01/24/2020   CREATININE 0.62 01/24/2020   BILITOT 0.5 01/24/2020   ALKPHOS 82 01/24/2020   AST 24 01/24/2020  ALT 34 (H) 01/24/2020   PROT 6.4 01/24/2020   ALBUMIN 4.0 01/24/2020   CALCIUM 9.1 01/24/2020   GFRAA 118 01/24/2020    Speciality Comments: No specialty comments available.  Procedures:  No procedures performed Allergies: Patient has no known allergies.   Assessment / Plan:     Visit Diagnoses: Polyarthralgia - left knee, both wrists, and both hands  Rheumatoid factor positive - 12/16/20: RF 214.7, anti-CCP >250, ESR 43, uric acid 5.8  Essential hypertension  Aortic atherosclerosis (HCC)  History of gastroesophageal reflux (GERD)  Gallstones  Liver  lesion  History of hyperlipidemia  Orders: No orders of the defined types were placed in this encounter.  No orders of the defined types were placed in this encounter.   Face-to-face time spent with patient was *** minutes. Greater than 50% of time was spent in counseling and coordination of care.  Follow-Up Instructions: No follow-ups on file.   Ofilia Neas, PA-C  Note - This record has been created using Dragon software.  Chart creation errors have been sought, but may not always  have been located. Such creation errors do not reflect on  the standard of medical care.

## 2021-01-13 ENCOUNTER — Other Ambulatory Visit: Payer: Self-pay

## 2021-01-13 MED ORDER — ENALAPRIL MALEATE 5 MG PO TABS: ORAL_TABLET | ORAL | 2 refills | Status: DC

## 2021-01-16 MED ORDER — PREDNISONE 10 MG PO TABS: 10.0000 mg | ORAL_TABLET | Freq: Every day | ORAL | 0 refills | Status: DC

## 2021-01-16 NOTE — Addendum Note (Signed)
Addended by: Ronnald Nian on: 01/16/2021 04:37 PM   Modules accepted: Orders

## 2021-01-20 ENCOUNTER — Ambulatory Visit: Payer: Managed Care, Other (non HMO) | Admitting: Rheumatology

## 2021-01-20 DIAGNOSIS — I10 Essential (primary) hypertension: Secondary | ICD-10-CM

## 2021-01-20 DIAGNOSIS — M2550 Pain in unspecified joint: Secondary | ICD-10-CM

## 2021-01-20 DIAGNOSIS — K769 Liver disease, unspecified: Secondary | ICD-10-CM

## 2021-01-20 DIAGNOSIS — Z8719 Personal history of other diseases of the digestive system: Secondary | ICD-10-CM

## 2021-01-20 DIAGNOSIS — Z8639 Personal history of other endocrine, nutritional and metabolic disease: Secondary | ICD-10-CM

## 2021-01-20 DIAGNOSIS — K8020 Calculus of gallbladder without cholecystitis without obstruction: Secondary | ICD-10-CM

## 2021-01-20 DIAGNOSIS — R768 Other specified abnormal immunological findings in serum: Secondary | ICD-10-CM

## 2021-01-20 DIAGNOSIS — I700 Atherosclerosis of aorta: Secondary | ICD-10-CM

## 2021-02-11 ENCOUNTER — Ambulatory Visit: Payer: Managed Care, Other (non HMO) | Admitting: Rheumatology

## 2021-03-05 ENCOUNTER — Ambulatory Visit: Payer: Managed Care, Other (non HMO) | Admitting: Rheumatology

## 2021-04-15 ENCOUNTER — Telehealth: Payer: Self-pay

## 2021-04-15 MED ORDER — ENALAPRIL MALEATE 5 MG PO TABS: ORAL_TABLET | ORAL | 2 refills | Status: DC

## 2021-04-15 NOTE — Telephone Encounter (Signed)
Refill request via fax

## 2021-06-01 IMAGING — US US ABDOMEN LIMITED
1 series · 13 of 25 positions shown · non-contrast
Comparison: 05/08/2018, CT 10/20/2019

CLINICAL DATA: Epigastric abdominal pain

EXAM:
ULTRASOUND ABDOMEN LIMITED RIGHT UPPER QUADRANT

[Series 1: us abdomen limited · 0.19mm/px · 13 of 45 slices shown]
[im 1/45]
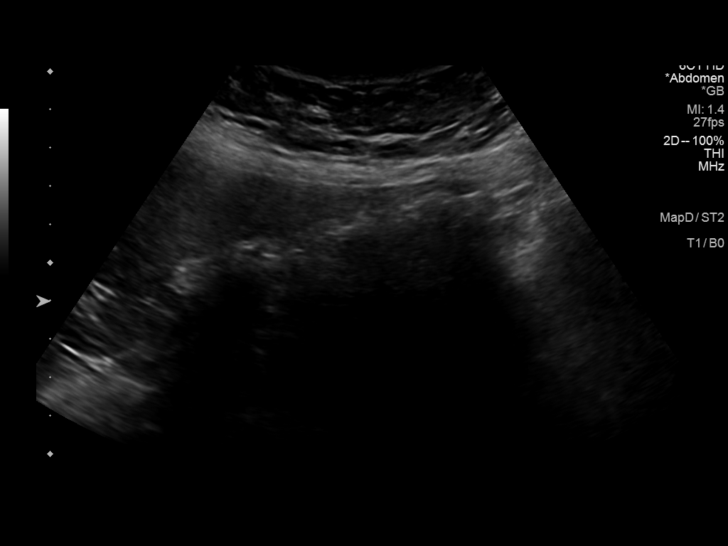
[im 4/45]
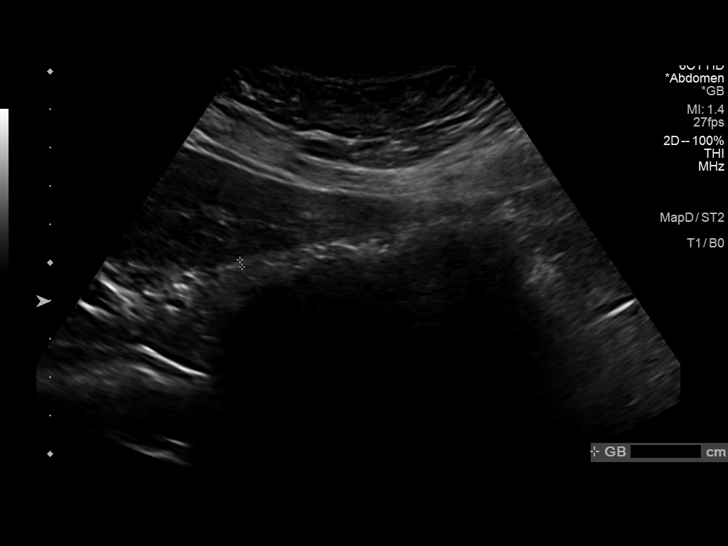
[im 8/45]
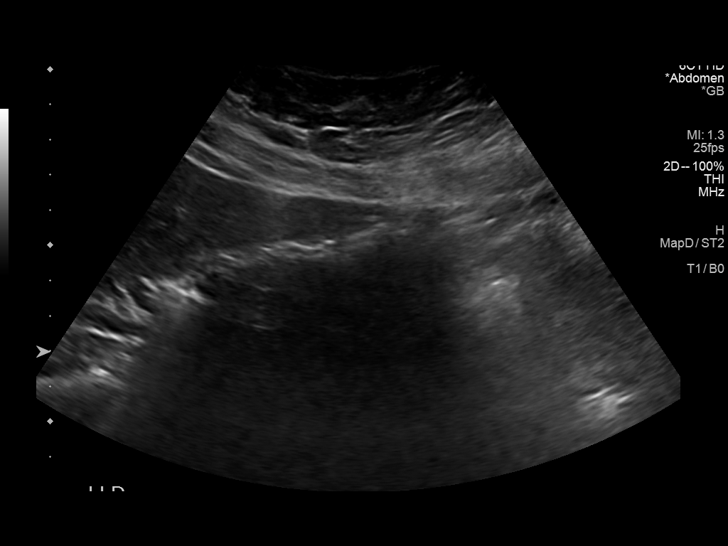
[im 12/45]
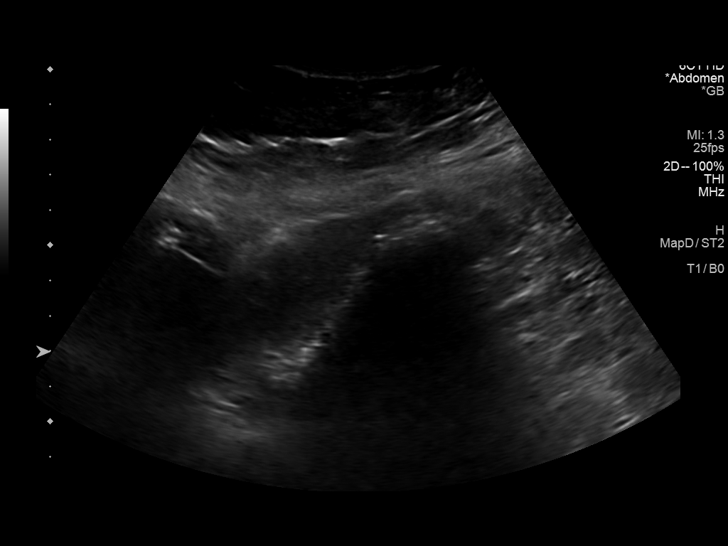
[im 15/45]
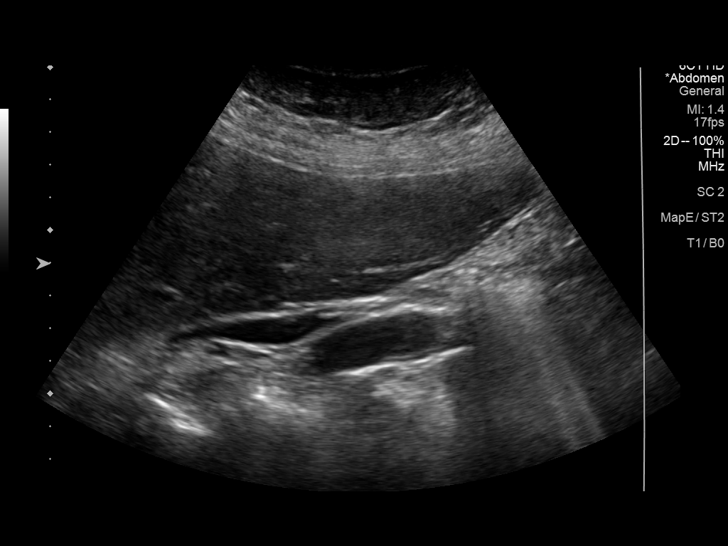
[im 19/45]
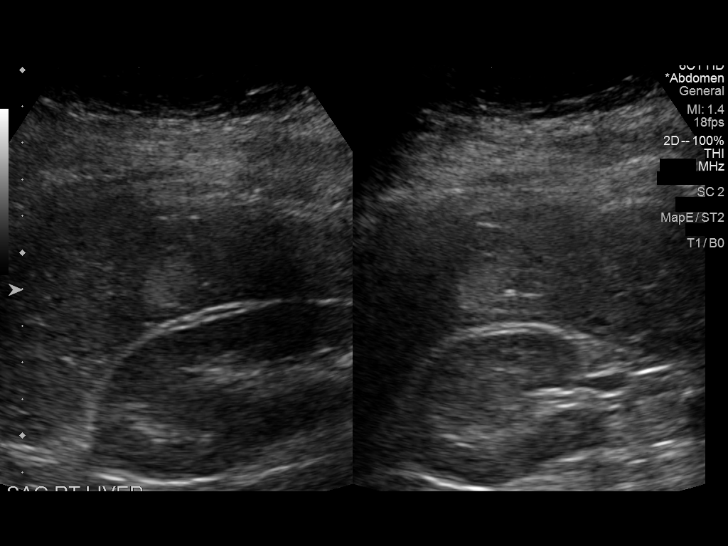
[im 23/45]
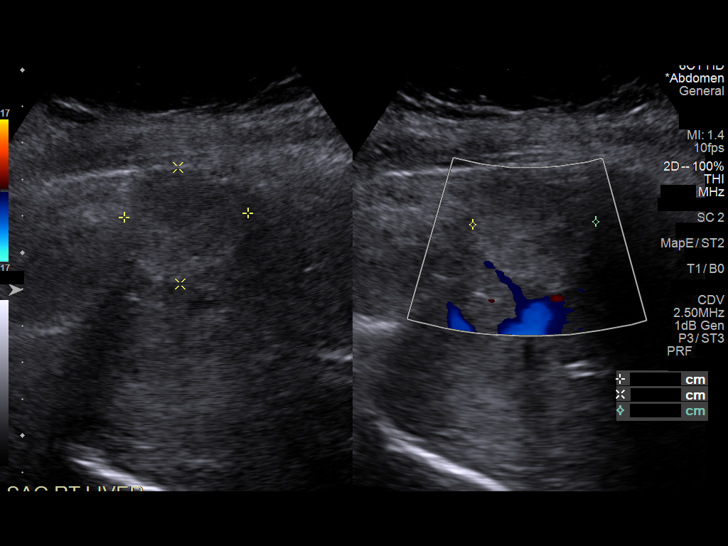
[im 26/45]
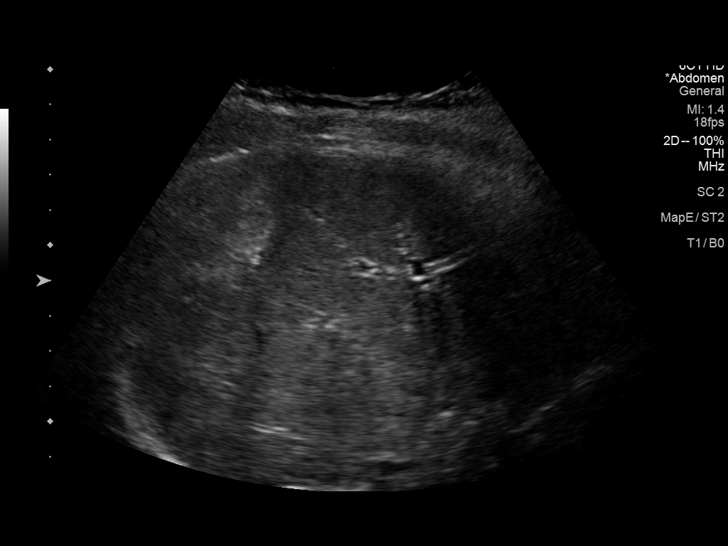
[im 30/45]
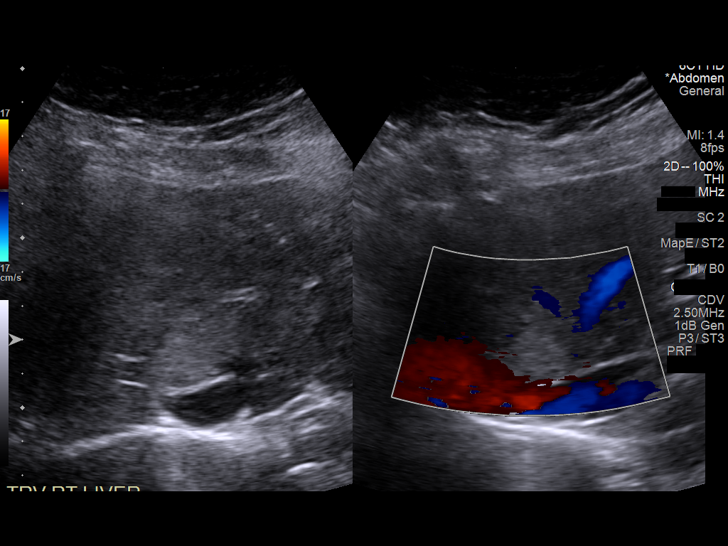
[im 34/45]
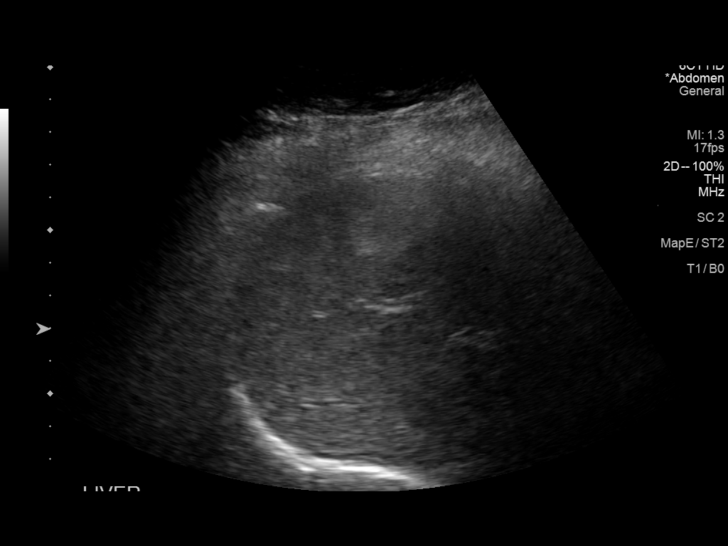
[im 37/45]
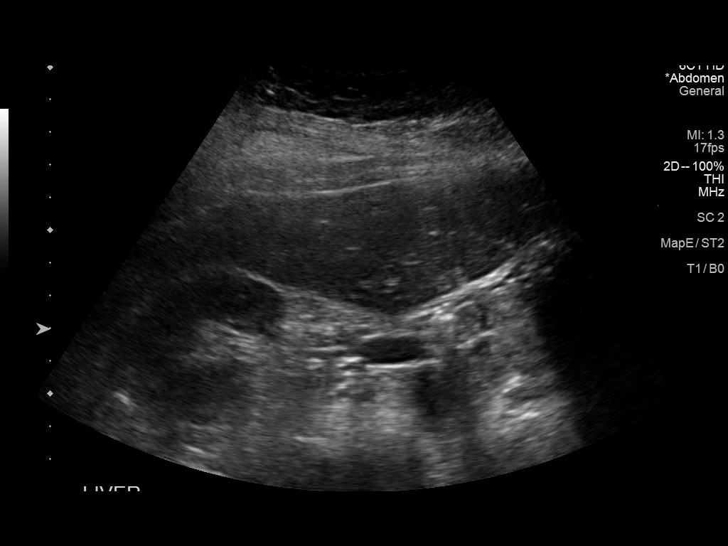
[im 41/45]
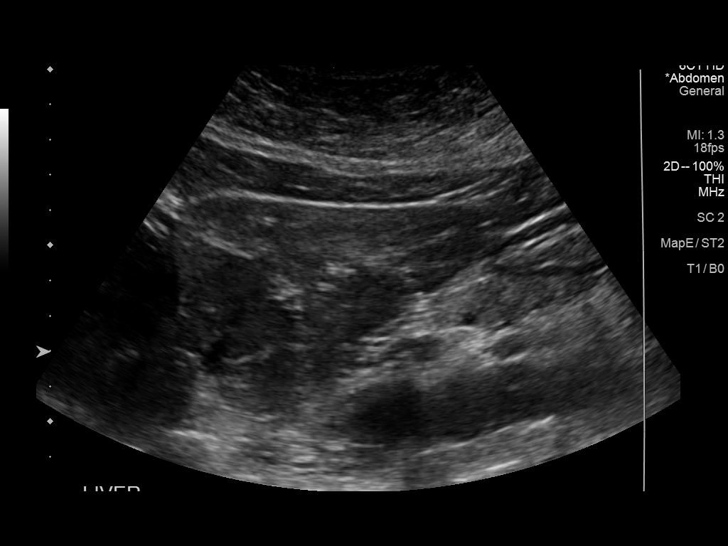
[im 45/45]
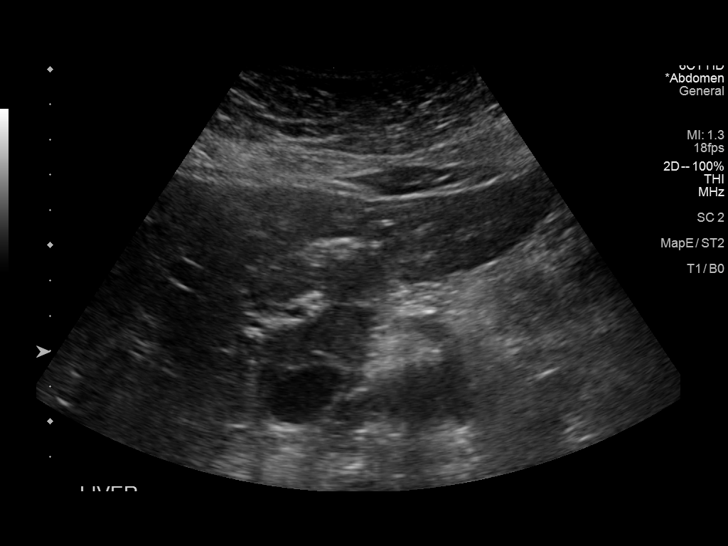

[13 of 25 positions shown; findings below may reference images not displayed]

FINDINGS: Gallbladder:

The gallbladder is stone filled and contracted in keeping with
changes of chronic cholecystitis in the appropriate clinical
setting. There is no pericholecystic fluid identified. The
sonographic Murphy sign is reportedly POSITIVE.

Common bile duct:

Diameter: 5 mm in mid diameter. The distal duct is obscured by
overlying bowel gas.

Liver:

A homogeneously hyperechoic 16 mm solid mass is identified within
the right hepatic lobe most in keeping with a benign cavernous
hemangioma in a patient without a history of malignancy. Similar
cm mass noted within the subserosal pericaval right hepatic lobe. A
subserosal mass within the right hepatic lobe measures 3.4 cm in
greatest dimension and is more heterogeneous in terms of
echogenicity, but was safely characterized as a benign cavernous
hemangioma on prior CT examination. Portal vein is patent on color
Doppler imaging with normal direction of blood flow towards the
liver.

Other: No ascites
IMPRESSION: Stone filled contracted gallbladder in keeping with changes of
chronic cholecystitis in the appropriate clinical setting.

Multiple hepatic masses compatible with multiple cavernous
hemangioma, better evaluated on prior CT examination of 08/20/2019.

## 2021-08-14 ENCOUNTER — Other Ambulatory Visit: Payer: Self-pay | Admitting: Orthopaedic Surgery

## 2021-08-14 ENCOUNTER — Telehealth: Payer: Self-pay | Admitting: Physician Assistant

## 2021-08-14 NOTE — Telephone Encounter (Signed)
done

## 2021-08-14 NOTE — Telephone Encounter (Signed)
Thank you :)

## 2021-08-14 NOTE — Telephone Encounter (Signed)
Left pt a vm to call the office next week to schedule surgery clearance. ?

## 2021-08-17 NOTE — Progress Notes (Signed)
Surgery orders requested via Epic inbox. °

## 2021-08-18 ENCOUNTER — Encounter: Payer: Self-pay | Admitting: Physician Assistant

## 2021-08-18 ENCOUNTER — Other Ambulatory Visit: Payer: Self-pay

## 2021-08-18 ENCOUNTER — Ambulatory Visit: Payer: Managed Care, Other (non HMO) | Admitting: Physician Assistant

## 2021-08-18 VITALS — BP 140/94 | HR 87 | Ht 67.0 in | Wt 254.6 lb

## 2021-08-18 DIAGNOSIS — M25562 Pain in left knee: Secondary | ICD-10-CM | POA: Diagnosis not present

## 2021-08-18 DIAGNOSIS — Z01818 Encounter for other preprocedural examination: Secondary | ICD-10-CM

## 2021-08-18 DIAGNOSIS — I10 Essential (primary) hypertension: Secondary | ICD-10-CM

## 2021-08-18 DIAGNOSIS — G8929 Other chronic pain: Secondary | ICD-10-CM

## 2021-08-18 DIAGNOSIS — R7401 Elevation of levels of liver transaminase levels: Secondary | ICD-10-CM

## 2021-08-18 DIAGNOSIS — E782 Mixed hyperlipidemia: Secondary | ICD-10-CM

## 2021-08-18 DIAGNOSIS — I700 Atherosclerosis of aorta: Secondary | ICD-10-CM

## 2021-08-18 DIAGNOSIS — Z6839 Body mass index (BMI) 39.0-39.9, adult: Secondary | ICD-10-CM

## 2021-08-18 DIAGNOSIS — K219 Gastro-esophageal reflux disease without esophagitis: Secondary | ICD-10-CM

## 2021-08-18 DIAGNOSIS — Z833 Family history of diabetes mellitus: Secondary | ICD-10-CM

## 2021-08-18 MED ORDER — ENALAPRIL MALEATE 2.5 MG PO TABS: 2.5000 mg | ORAL_TABLET | Freq: Every day | ORAL | 2 refills | Status: DC

## 2021-08-18 NOTE — Progress Notes (Addendum)
Acute Office Visit  Subjective:    Patient ID: Caroly Pagel, female    DOB: [birth year], 56 y.o.   MRN: [id number]  Chief Complaint  Patient presents with   Procedure    Surgery clearance. No other other concerns    HPI Patient is in today for a pre-op physical; will be having a left knee arthoplasty under spinal anesthesia on 09/02/2019 by [facility] with Dr. Lubertha Basque. Dalldorf; denies previous issues with anesthesia. Today is not fasting, reports she last ate breakfast about 9:30 - 10:00 am, 2 boiled eggs and 1 piece of sausage.   Past Medical History:  Diagnosis Date   Alcohol use    Aortic atherosclerosis (HCC) 12/06/2019   Arthritis    self report   Gallstones 05/08/2018   Found on [place]   Gastroesophageal reflux disease 12/20/2018   Hyperlipidemia    Hypertension    Liver lesion 12/06/2019    Past Surgical History:  Procedure Laterality Date   FOOT SURGERY  2005   FOOT SURGERY Left 2009   KNEE ARTHROCENTESIS  08/2018    Family History  Problem Relation Age of Onset   Diabetes Mother    Thyroid disease Sister    Diabetes Sister    Thyroid disease Sister    Diabetes Brother    Colon polyps Neg Hx    Colon cancer Neg Hx    Esophageal cancer Neg Hx    Rectal cancer Neg Hx    Stomach cancer Neg Hx     Social History   Socioeconomic History   Marital status: Significant Other    Spouse name: Not on file   Number of children: Not on file   Years of education: Not on file   Highest education level: Not on file  Occupational History   Not on file  Tobacco Use   Smoking status: Every Day    Packs/day: 0.50    Years: 40.00    Pack years: 20.00    Types: Cigarettes   Smokeless tobacco: Never  Vaping Use   Vaping Use: Never used  Substance and Sexual Activity   Alcohol use: Yes    Alcohol/week: 2.0 standard drinks    Types: 2 Cans of beer per week    Comment: socially   Drug use: No    Comment: former cocaine user    Sexual  activity: Yes    Partners: Female    Birth control/protection: Post-menopausal  Other Topics Concern   Not on file  Social History Narrative   Not on file   Social Determinants of Health   Financial Resource Strain: Not on file  Food Insecurity: Not on file  Transportation Needs: Not on file  Physical Activity: Not on file  Stress: Not on file  Social Connections: Not on file  Intimate Partner Violence: Not on file    Outpatient Medications Prior to Visit  Medication Sig Dispense Refill   enalapril (VASOTEC) 5 MG tablet TAKE 1 TABLET(5 MG) BY MOUTH DAILY (Patient taking differently: Take 2.5 mg by mouth daily.) 30 tablet 2   diclofenac (VOLTAREN) 75 MG EC tablet Take 1 tablet (75 mg total) by mouth 2 (two) times daily. (Patient not taking: Reported on 08/14/2021) 30 tablet 0   predniSONE (DELTASONE) 10 MG tablet Take 1 tablet (10 mg total) by mouth daily with breakfast. (Patient not taking: Reported on 08/14/2021) 30 tablet 0   predniSONE (DELTASONE) 10 MG tablet Take 10 mg by mouth daily. (Patient  not taking: Reported on 08/18/2021)     No facility-administered medications prior to visit.    No Known Allergies  Review of Systems  Constitutional:  Negative for activity change and chills.  HENT:  Negative for congestion and voice change.   Eyes:  Negative for pain and redness.  Respiratory:  Negative for cough and wheezing.   Cardiovascular:  Negative for chest pain.  Gastrointestinal:  Negative for constipation, diarrhea, nausea and vomiting.  Endocrine: Negative for polyuria.  Genitourinary:  Negative for frequency.  Musculoskeletal:  Positive for arthralgias and joint swelling.  Skin:  Negative for color change and rash.  Allergic/Immunologic: Negative for immunocompromised state.  Neurological:  Negative for dizziness.  Psychiatric/Behavioral:  Negative for agitation.       Objective:    Physical Exam Vitals and nursing note reviewed.  Constitutional:      General:  She is not in acute distress.    Appearance: Normal appearance. She is not ill-appearing.  HENT:     Head: Normocephalic and atraumatic.     Right Ear: External ear normal.     Left Ear: External ear normal.     Nose: No congestion.  Eyes:     Extraocular Movements: Extraocular movements intact.     Conjunctiva/sclera: Conjunctivae normal.     Pupils: Pupils are equal, round, and reactive to light.  Cardiovascular:     Rate and Rhythm: Normal rate and regular rhythm.     Pulses: Normal pulses.     Heart sounds: Normal heart sounds.  Pulmonary:     Effort: Pulmonary effort is normal.     Breath sounds: Normal breath sounds. No wheezing.  Abdominal:     General: Bowel sounds are normal.     Palpations: Abdomen is soft.  Musculoskeletal:     Cervical back: Normal range of motion and neck supple.     Right lower leg: No edema.     Left lower leg: No edema.  Skin:    General: Skin is warm and dry.     Findings: No bruising.  Neurological:     General: No focal deficit present.     Mental Status: She is alert and oriented to person, place, and time.  Psychiatric:        Mood and Affect: Mood normal.        Behavior: Behavior normal.        Thought Content: Thought content normal.    BP (!) 140/94   Pulse 87   Ht 5\' 7"  (1.702 m)   Wt 254 lb 9.6 oz (115.5 kg)   SpO2 98%   BMI 39.88 kg/m   Wt Readings from Last 3 Encounters:  08/18/21 254 lb 9.6 oz (115.5 kg)  12/16/20 249 lb (112.9 kg)  02/27/20 226 lb (102.5 kg)   BP Readings from Last 5 Encounters:  08/18/21 (!) 140/94  12/16/20 112/64  02/27/20 120/80  01/07/20 (!) 98/59  01/03/20 (!) 128/90      Health Maintenance Due  Topic Date Due   HIV Screening  Never done   TETANUS/TDAP  Never done   PAP SMEAR-Modifier  Never done   MAMMOGRAM  Never done   COVID-19 Vaccine (3 - Booster for Pfizer series) 12/20/2019    There are no preventive care reminders to display for this patient.   Lab Results  Component  Value Date   TSH 0.756 02/10/2017   Lab Results  Component Value Date   WBC 4.2 12/06/2019   HGB  13.6 12/06/2019   HCT 40.8 12/06/2019   MCV 85 12/06/2019   PLT 239 12/06/2019   Lab Results  Component Value Date   NA 143 01/24/2020   K 4.0 01/24/2020   CO2 21 01/24/2020   GLUCOSE 102 (H) 01/24/2020   BUN 5 (L) 01/24/2020   CREATININE 0.62 01/24/2020   BILITOT 0.5 01/24/2020   ALKPHOS 82 01/24/2020   AST 24 01/24/2020   ALT 34 (H) 01/24/2020   PROT 6.4 01/24/2020   ALBUMIN 4.0 01/24/2020   CALCIUM 9.1 01/24/2020   ANIONGAP 12 10/18/2019   Lab Results  Component Value Date   CHOL 108 01/24/2020   Lab Results  Component Value Date   HDL 37 (L) 01/24/2020   Lab Results  Component Value Date   LDLCALC 57 01/24/2020   Lab Results  Component Value Date   TRIG 65 01/24/2020   Lab Results  Component Value Date   CHOLHDL 2.9 01/24/2020   No results found for: HGBA1C  The ASCVD Risk score (Arnett DK, et al., 2019) failed to calculate for the following reasons:   The valid total cholesterol range is 130 to 320 mg/dL      Assessment & Plan:   Problem List Items Addressed This Visit       Cardiovascular and Mediastinum   Essential hypertension    Stable Will change to enalapril/vasotec 2.5 mg daily, to assist compliance Eat a low salt diet and minimize processed foods (examples canned vegetables, lunch meats, fast food), drink 8 glasses of water a day, exercise 3 - 5 times a week (example walking 30 minutes daily), decrease alcohol intake, decrease smoking or nicotine use, decrease caffeine intake      Relevant Medications   enalapril (VASOTEC) 2.5 MG tablet   Other Relevant Orders   Comprehensive metabolic panel   Hemoglobin A1c   Aortic atherosclerosis (HCC)   Relevant Medications   enalapril (VASOTEC) 2.5 MG tablet     Digestive   GERD without esophagitis     Other   Chronic pain of left knee   Elevated ALT measurement   Relevant Orders    Comprehensive metabolic panel   Mixed hyperlipidemia    Stable, continue current management Increase fiber intake (Benefiber or Metamucil, Cherrios,  oatmeal, beans, nuts, fruits and vegetables), limit saturated fats (in fried foods, red meat), can add OTC fish oil supplement, eat fish with Omega-3 fatty acids like salmon and tuna, exercise for 30 minutes 3 - 5 times a week, drink 8 - 10 glasses of water a day Last lipid panel 12/26/2019 elevated LDL 111, TC/HDL ratio 4.0       Relevant Medications   enalapril (VASOTEC) 2.5 MG tablet   Other Relevant Orders   Lipid panel   Family history of diabetes mellitus (DM)   Relevant Orders   Hemoglobin A1c   Body mass index (BMI) of 39.0 to 39.9 in adult   Other Visit Diagnoses     Preoperative examination    -  Primary    Relevant Orders   CBC with Differential/Platelet   Comprehensive metabolic panel   Hemoglobin A1c   Lipid panel Cleared for surgery, paperwork will be faxed to [facility]        Meds ordered this encounter  Medications   enalapril (VASOTEC) 2.5 MG tablet    Sig: Take 1 tablet (2.5 mg total) by mouth daily.    Dispense:  90 tablet    Refill:  2    Order  Specific Question:   Supervising Provider    Answer:   Ronnald Nian [[id number]]     Jake Shark, PA-C

## 2021-08-18 NOTE — Patient Instructions (Addendum)
DUE TO COVID-19 ONLY ONE VISITOR  (aged 57 and older)  IS ALLOWED TO COME WITH YOU AND STAY IN THE WAITING ROOM ONLY DURING PRE OP AND PROCEDURE.   ?**NO VISITORS ARE ALLOWED IN THE SHORT STAY AREA OR RECOVERY ROOM!!** ?     ? Your procedure is scheduled on: 09/01/21 ? ? [facility] ? ?  Report to admitting at 9:45 AM ? ? Call this number if you have problems the morning of surgery [phone] ? ? Do not eat food :After Midnight. ? ? After Midnight you may have the following liquids until 9:30 AM DAY OF SURGERY ? ?Water ?Black Coffee (sugar ok, NO MILK/CREAM OR CREAMERS)  ?Tea (sugar ok, NO MILK/CREAM OR CREAMERS) regular and decaf                             ?Plain Jell-O (NO RED)                                           ?Fruit ices (not with fruit pulp, NO RED)                                     ?Popsicles (NO RED)                                                                  ?Juice: apple, WHITE grape, WHITE cranberry ?Sports drinks like Gatorade (NO RED) ?Clear broth(vegetable,chicken,beef)  ?  ?The day of surgery:  ?Drink ONE (1) Pre-Surgery Clear Ensure at 9:30 AM the morning of surgery. Drink in one sitting. Do not sip.  ?This drink was given to you during your hospital  ?pre-op appointment visit. ?Nothing else to drink after completing the  ?Pre-Surgery Clear Ensure. ?  ?       If you have questions, please contact your surgeon?s office. ? ? ?FOLLOW BOWEL PREP AND ANY ADDITIONAL PRE OP INSTRUCTIONS YOU RECEIVED FROM YOUR SURGEON'S OFFICE!!! ?  ?  ?Oral Hygiene is also important to reduce your risk of infection.                                    ?Remember - BRUSH YOUR TEETH THE MORNING OF SURGERY WITH YOUR REGULAR TOOTHPASTE ? ? Do NOT smoke after Midnight ? ? Take these medicines the morning of surgery with A SIP OF WATER: None ?                  ?           You may not have any metal on your body including hair pins, jewelry, and body piercing ? ?           Do not wear  make-up, lotions, powders, perfumes, or deodorant ? ?Do not wear nail polish including gel and S&S, artificial/acrylic nails, or any other type of covering on natural nails including finger and toenails. If you  have artificial nails, gel coating, etc. that needs to be removed by a nail salon please have this removed prior to surgery or surgery may need to be canceled/ delayed if the surgeon/ anesthesia feels like they are unable to be safely monitored.  ? ?Do not shave  48 hours prior to surgery.  ? ? Do not bring valuables to the hospital. [facility] IS NOT ?            RESPONSIBLE   FOR VALUABLES. ? ? Contacts, dentures or bridgework may not be worn into surgery. ?  ? Patients discharged on the day of surgery will not be allowed to drive home.  Someone NEEDS to stay with you for the first 24 hours after anesthesia. ? ?            Please read over the following fact sheets you were given: IF YOU HAVE QUESTIONS ABOUT YOUR PRE-OP INSTRUCTIONS PLEASE CALL [phone]- [place] ? ?   [facility] - Preparing for Surgery ?Before surgery, you can play an important role.  Because skin is not sterile, your skin needs to be as free of germs as possible.  You can reduce the number of germs on your skin by washing with CHG (chlorahexidine gluconate) soap before surgery.  CHG is an antiseptic cleaner which kills germs and bonds with the skin to continue killing germs even after washing. ?Please DO NOT use if you have an allergy to CHG or antibacterial soaps.  If your skin becomes reddened/irritated stop using the CHG and inform your nurse when you arrive at Short Stay. ?Do not shave (including legs and underarms) for at least 48 hours prior to the first CHG shower.  You may shave your face/neck. ? ?Please follow these instructions carefully: ? 1.  Shower with CHG Soap the night before surgery and the  morning of surgery. ? 2.  If you choose to wash your hair, wash your hair first as usual with your normal  shampoo. ? 3.  After  you shampoo, rinse your hair and body thoroughly to remove the shampoo.                            ? 4.  Use CHG as you would any other liquid soap.  You can apply chg directly to the skin and wash.  Gently with a scrungie or clean washcloth. ? 5.  Apply the CHG Soap to your body ONLY FROM THE NECK DOWN.   Do   not use on face/ open      ?                     Wound or open sores. Avoid contact with eyes, ears mouth and   genitals (private parts).  ?                     Engineering geologist,  Genitals (private parts) with your normal soap. ?            6.  Wash thoroughly, paying special attention to the area where your    surgery  will be performed. ? 7.  Thoroughly rinse your body with warm water from the neck down. ? 8.  DO NOT shower/wash with your normal soap after using and rinsing off the CHG Soap. ?               9.  Pat yourself dry with  a clean towel. ?           10.  Wear clean pajamas. ?           11.  Place clean sheets on your bed the night of your first shower and do not  sleep with pets. ?Day of Surgery : ?Do not apply any lotions/deodorants the morning of surgery.  Please wear clean clothes to the hospital/surgery center. ? ?FAILURE TO FOLLOW THESE INSTRUCTIONS MAY RESULT IN THE CANCELLATION OF YOUR SURGERY ? ?PATIENT SIGNATURE_________________________________ ? ?NURSE SIGNATURE__________________________________ ? ?________________________________________________________________________  ? ?Incentive Spirometer ? ?An incentive spirometer is a tool that can help keep your lungs clear and active. This tool measures how well you are filling your lungs with each breath. Taking long deep breaths may help reverse or decrease the chance of developing breathing (pulmonary) problems (especially infection) following: ?A long period of time when you are unable to move or be active. ?BEFORE THE PROCEDURE  ?If the spirometer includes an indicator to show your best effort, your nurse or respiratory therapist will set it to a  desired goal. ?If possible, sit up straight or lean slightly forward. Try not to slouch. ?Hold the incentive spirometer in an upright position. ?INSTRUCTIONS FOR USE  ?Sit on the edge of your bed if possible, or sit up as far as you can in bed or on a chair. ?Hold the incentive spirometer in an upright position. ?Breathe out normally. ?Place the mouthpiece in your mouth and seal your lips tightly around it. ?Breathe in slowly and as deeply as possible, raising the piston or the ball toward the top of the column. ?Hold your breath for 3-5 seconds or for as long as possible. Allow the piston or ball to fall to the bottom of the column. ?Remove the mouthpiece from your mouth and breathe out normally. ?Rest for a few seconds and repeat Steps 1 through 7 at least 10 times every 1-2 hours when you are awake. Take your time and take a few normal breaths between deep breaths. ?The spirometer may include an indicator to show your best effort. Use the indicator as a goal to work toward during each repetition. ?After each set of 10 deep breaths, practice coughing to be sure your lungs are clear. If you have an incision (the cut made at the time of surgery), support your incision when coughing by placing a pillow or rolled up towels firmly against it. ?Once you are able to get out of bed, walk around indoors and cough well. You may stop using the incentive spirometer when instructed by your caregiver.  ?RISKS AND COMPLICATIONS ?Take your time so you do not get dizzy or light-headed. ?If you are in pain, you may need to take or ask for pain medication before doing incentive spirometry. It is harder to take a deep breath if you are having pain. ?AFTER USE ?Rest and breathe slowly and easily. ?It can be helpful to keep track of a log of your progress. Your caregiver can provide you with a simple table to help with this. ?If you are using the spirometer at home, follow these instructions: ?SEEK MEDICAL CARE IF:  ?You are having  difficultly using the spirometer. ?You have trouble using the spirometer as often as instructed. ?Your pain medication is not giving enough relief while using the spirometer. ?You develop fever of 100.

## 2021-08-18 NOTE — Progress Notes (Addendum)
COVID Vaccine Completed: yes x2 ?Date COVID Vaccine completed: 10/06/19, 10/25/19 ?Has received booster: ?COVID vaccine manufacturer: Pfizer      ? ?Date of COVID positive in last 90 days: no ? ?PCP - Burnard Hawthorne, PA ?Cardiologist - n/a ? ?Medical clearance by Burnard Hawthorne 08/18/21 on chart and in Epic. ? ?Chest x-ray - 08/19/21 Epic ?EKG - 08/19/21 Epic/chart ?Stress Test - n/a ?ECHO - n/a ?Cardiac Cath - n/a ?Pacemaker/ICD device last checked: n/a ?Spinal Cord Stimulator: n/a ? ?Bowel Prep - no ? ?Sleep Study - n/a ?CPAP -  ? ?Fasting Blood Sugar - n/a ?Checks Blood Sugar _____ times a day ? ?Blood Thinner Instructions: n/a ?Aspirin Instructions: ?Last Dose: ? ?Activity level: Can go up a flight of stairs and perform activities of daily living without stopping and without symptoms of chest pain or shortness of breath.   ? ?Anesthesia review:  ? ?Patient denies shortness of breath, fever, cough and chest pain at PAT appointment ? ? ?Patient verbalized understanding of instructions that were given to them at the PAT appointment. Patient was also instructed that they will need to review over the PAT instructions again at home before surgery.  ?

## 2021-08-18 NOTE — Assessment & Plan Note (Addendum)
Stable, continue current management ?Increase fiber intake (Benefiber or Metamucil, Cherrios,  oatmeal, beans, nuts, fruits and vegetables), limit saturated fats (in fried foods, red meat), can add OTC fish oil supplement, eat fish with Omega-3 fatty acids like salmon and tuna, exercise for 30 minutes 3 - 5 times a week, drink 8 - 10 glasses of water a day ?Last lipid panel 12/26/2019 elevated LDL 111, TC/HDL ratio 4.0 ? ?

## 2021-08-18 NOTE — Assessment & Plan Note (Addendum)
Stable ?Will change to enalapril/vasotec 2.5 mg daily, to assist compliance ?Eat a low salt diet and minimize processed foods (examples canned vegetables, lunch meats, fast food), drink 8 glasses of water a day, exercise 3 - 5 times a week (example walking 30 minutes daily), decrease alcohol intake, decrease smoking or nicotine use, decrease caffeine intake ?

## 2021-08-19 ENCOUNTER — Ambulatory Visit (HOSPITAL_COMMUNITY)
Admission: RE | Admit: 2021-08-19 | Discharge: 2021-08-19 | Disposition: A | Discharge status: Home / Self Care | Payer: Managed Care, Other (non HMO) | Source: Ambulatory Visit | Attending: Orthopaedic Surgery | Admitting: Orthopaedic Surgery

## 2021-08-19 ENCOUNTER — Encounter (HOSPITAL_COMMUNITY)
Admission: RE | Admit: 2021-08-19 | Discharge: 2021-08-19 | Disposition: A | Discharge status: Home / Self Care | Payer: Managed Care, Other (non HMO) | Source: Ambulatory Visit | Attending: Orthopaedic Surgery | Admitting: Orthopaedic Surgery

## 2021-08-19 ENCOUNTER — Encounter (HOSPITAL_COMMUNITY): Payer: Self-pay

## 2021-08-19 ENCOUNTER — Other Ambulatory Visit: Payer: Self-pay

## 2021-08-19 VITALS — HR 87 | Temp 98.4°F | Resp 14 | Ht 67.0 in | Wt 254.2 lb

## 2021-08-19 DIAGNOSIS — Z01818 Encounter for other preprocedural examination: Secondary | ICD-10-CM | POA: Insufficient documentation

## 2021-08-19 HISTORY — DX: Headache, unspecified: R51.9

## 2021-08-19 LAB — CBC WITH DIFFERENTIAL/PLATELET
Basophils Absolute: 0 10*3/uL (ref 0.0–0.2)
Basos: 0 %
EOS (ABSOLUTE): 0.1 10*3/uL (ref 0.0–0.4)
Eos: 1 %
Hematocrit: 41.6 % (ref 34.0–46.6)
Hemoglobin: 13.6 g/dL (ref 11.1–15.9)
Immature Grans (Abs): 0.1 10*3/uL (ref 0.0–0.1)
Immature Granulocytes: 1 %
Lymphocytes Absolute: 2.9 10*3/uL (ref 0.7–3.1)
Lymphs: 30 %
MCH: 28.4 pg (ref 26.6–33.0)
MCHC: 32.7 g/dL (ref 31.5–35.7)
MCV: 87 fL (ref 79–97)
Monocytes Absolute: 0.5 10*3/uL (ref 0.1–0.9)
Monocytes: 5 %
Neutrophils Absolute: 6.1 10*3/uL (ref 1.4–7.0)
Neutrophils: 63 %
Platelets: 221 10*3/uL (ref 150–450)
RBC: 4.79 x10E6/uL (ref 3.77–5.28)
RDW: 13.1 % (ref 11.7–15.4)
WBC: 9.5 10*3/uL (ref 3.4–10.8)

## 2021-08-19 LAB — COMPREHENSIVE METABOLIC PANEL
ALT: 44 IU/L — ABNORMAL HIGH (ref 0–32)
AST: 24 IU/L (ref 0–40)
Albumin/Globulin Ratio: 1.8 (ref 1.2–2.2)
Albumin: 4.3 g/dL (ref 3.8–4.9)
Alkaline Phosphatase: 77 IU/L (ref 44–121)
BUN/Creatinine Ratio: 27 — ABNORMAL HIGH (ref 9–23)
BUN: 14 mg/dL (ref 6–24)
Bilirubin Total: 0.4 mg/dL (ref 0.0–1.2)
CO2: 18 mmol/L — ABNORMAL LOW (ref 20–29)
Calcium: 9.5 mg/dL (ref 8.7–10.2)
Chloride: 110 mmol/L — ABNORMAL HIGH (ref 96–106)
Creatinine, Ser: 0.52 mg/dL — ABNORMAL LOW (ref 0.57–1.00)
Globulin, Total: 2.4 g/dL (ref 1.5–4.5)
Glucose: 116 mg/dL — ABNORMAL HIGH (ref 70–99)
Potassium: 3.9 mmol/L (ref 3.5–5.2)
Sodium: 143 mmol/L (ref 134–144)
Total Protein: 6.7 g/dL (ref 6.0–8.5)
eGFR: 109 mL/min/{1.73_m2} (ref >59)

## 2021-08-19 LAB — TYPE AND SCREEN
ABO/RH(D): A POS
Antibody Screen: NEGATIVE

## 2021-08-19 LAB — LIPID PANEL
Chol/HDL Ratio: 3.9 ratio (ref 0.0–4.4)
Cholesterol, Total: 184 mg/dL (ref 100–199)
HDL: 47 mg/dL (ref >39)
LDL Chol Calc (NIH): 106 mg/dL — ABNORMAL HIGH (ref 0–99)
Triglycerides: 177 mg/dL — ABNORMAL HIGH (ref 0–149)
VLDL Cholesterol Cal: 31 mg/dL (ref 5–40)

## 2021-08-19 LAB — HEMOGLOBIN A1C
Est. average glucose Bld gHb Est-mCnc: 117 mg/dL
Hgb A1c MFr Bld: 5.7 % — ABNORMAL HIGH (ref 4.8–5.6)

## 2021-08-19 LAB — SURGICAL PCR SCREEN
MRSA, PCR: NEGATIVE
Staphylococcus aureus: NEGATIVE

## 2021-08-20 ENCOUNTER — Encounter: Payer: Self-pay | Admitting: Physician Assistant

## 2021-08-20 DIAGNOSIS — R7301 Impaired fasting glucose: Secondary | ICD-10-CM | POA: Insufficient documentation

## 2021-08-28 NOTE — H&P (Signed)
TOTAL KNEE ADMISSION H&P ? ?Patient is being admitted for left total knee arthroplasty. ? ?Subjective: ? ?Chief Complaint:left knee pain. ? ?HPI: Kathryn Delgado, 56 y.o. female, has a history of pain and functional disability in the left knee due to arthritis and has failed non-surgical conservative treatments for greater than 12 weeks to includeNSAID's and/or analgesics, corticosteriod injections, viscosupplementation injections, flexibility and strengthening excercises, supervised PT with diminished ADL's post treatment, use of assistive devices, weight reduction as appropriate, and activity modification.  Onset of symptoms was gradual, starting 5 years ago with gradually worsening course since that time. The patient noted prior procedures on the knee to include  arthroscopy on the left knee(s).  Patient currently rates pain in the left knee(s) at 10 out of 10 with activity. Patient has night pain, worsening of pain with activity and weight bearing, pain that interferes with activities of daily living, crepitus, and joint swelling.  Patient has evidence of subchondral cysts, subchondral sclerosis, periarticular osteophytes, and joint space narrowing by imaging studies. There is no active infection. ? ?Patient Active Problem List  ? Diagnosis Date Noted  ? Elevated fasting blood sugar 08/20/2021  ? Mixed hyperlipidemia 08/18/2021  ? GERD without esophagitis 08/18/2021  ? Family history of diabetes mellitus (DM) 08/18/2021  ? Body mass index (BMI) of 39.0 to 39.9 in adult 08/18/2021  ? Acute pain of both shoulders 12/16/2020  ? Shoulder weakness 12/16/2020  ? Pain in both wrists 12/16/2020  ? Swelling of left wrist 12/16/2020  ? Pain in finger of both hands 12/16/2020  ? Chronic knee pain after total replacement of left knee joint 07/01/2020  ? 10 year risk of MI or stroke < 7.5% 12/08/2019  ? Liver lesion 12/06/2019  ? Aortic atherosclerosis (HCC) 12/06/2019  ? Alcohol consumption heavy 12/20/2018  ? Gallstones  05/08/2018  ? Essential hypertension 04/25/2018  ? Chronic pain of left knee 04/25/2018  ? Elevated ALT measurement 04/25/2018  ? Smoker 04/25/2018  ? ?Past Medical History:  ?Diagnosis Date  ? Alcohol use   ? Aortic atherosclerosis (HCC) 12/06/2019  ? Arthritis   ? self report  ? Gallstones 05/08/2018  ? Found on [place]  ? Gastroesophageal reflux disease 12/20/2018  ? Headache   ? Hyperlipidemia   ? Hypertension   ? Liver lesion 12/06/2019  ?  ?Past Surgical History:  ?Procedure Laterality Date  ? CHOLECYSTECTOMY    ? FOOT SURGERY  2005  ? FOOT SURGERY Left 2009  ? KNEE ARTHROCENTESIS  08/2018  ? WISDOM TOOTH EXTRACTION    ?  ?No current facility-administered medications for this encounter.  ? ?Current Outpatient Medications  ?Medication Sig Dispense Refill Last Dose  ? enalapril (VASOTEC) 2.5 MG tablet Take 1 tablet (2.5 mg total) by mouth daily. 90 tablet 2   ? ?No Known Allergies  ?Social History  ? ?Tobacco Use  ? Smoking status: Every Day  ?  Packs/day: 0.50  ?  Years: 40.00  ?  Pack years: 20.00  ?  Types: Cigarettes  ? Smokeless tobacco: Never  ?Substance Use Topics  ? Alcohol use: Yes  ?  Alcohol/week: 2.0 standard drinks  ?  Types: 2 Cans of beer per week  ?  Comment: socially  ?  ?Family History  ?Problem Relation Age of Onset  ? Diabetes Mother   ? Thyroid disease Sister   ? Diabetes Sister   ? Thyroid disease Sister   ? Diabetes Brother   ? Colon polyps Neg Hx   ? Colon  cancer Neg Hx   ? Esophageal cancer Neg Hx   ? Rectal cancer Neg Hx   ? Stomach cancer Neg Hx   ?  ? ?Review of Systems  ?Musculoskeletal:  Positive for arthralgias.  ?     Examination of the left knee shows 2+ effusion.  Range of motion from about 5-90? of flexion.  Skin is benign.  She is neurovascularly intact distally.    ?All other systems reviewed and are negative. ? ?Objective: ? ?Physical Exam ?Constitutional:   ?   Appearance: Normal appearance.  ?HENT:  ?   Head: Normocephalic and atraumatic.  ?   Nose: Nose normal.  ?    Mouth/Throat:  ?   Pharynx: Oropharynx is clear.  ?Eyes:  ?   Extraocular Movements: Extraocular movements intact.  ?Cardiovascular:  ?   Rate and Rhythm: Normal rate.  ?Pulmonary:  ?   Effort: Pulmonary effort is normal.  ?Abdominal:  ?   Palpations: Abdomen is soft.  ?Musculoskeletal:  ?   Cervical back: Normal range of motion.  ?   Comments: Examination of the left knee shows 2+ effusion.  Range of motion from about 5-90? of flexion.  Skin is benign.  She is neurovascularly intact distally.    ?Skin: ?   General: Skin is warm and dry.  ?Neurological:  ?   General: No focal deficit present.  ?   Mental Status: She is alert and oriented to person, place, and time. Mental status is at baseline.  ?Psychiatric:     ?   Mood and Affect: Mood normal.     ?   Behavior: Behavior normal.     ?   Thought Content: Thought content normal.     ?   Judgment: Judgment normal.  ? ? ?Vital signs in last 24 hours: ?  ? ?Labs: ? ? ?Estimated body mass index is 39.81 kg/m? as calculated from the following: ?  Height as of 08/19/21: 5\' 7"  (1.702 m). ?  Weight as of 08/19/21: 115.3 kg. ? ? ?Imaging Review ?Plain radiographs demonstrate severe degenerative joint disease of the left knee(s). The overall alignment isneutral. The bone quality appears to be good for age and reported activity level. ? ? ? ? ? ?Assessment/Plan: ? ?End stage primary arthritis, left knee  ? ?The patient history, physical examination, clinical judgment of the provider and imaging studies are consistent with end stage degenerative joint disease of the left knee(s) and total knee arthroplasty is deemed medically necessary. The treatment options including medical management, injection therapy arthroscopy and arthroplasty were discussed at length. The risks and benefits of total knee arthroplasty were presented and reviewed. The risks due to aseptic loosening, infection, stiffness, patella tracking problems, thromboembolic complications and other imponderables were  discussed. The patient acknowledged the explanation, agreed to proceed with the plan and consent was signed. Patient is being admitted for inpatient treatment for surgery, pain control, PT, OT, prophylactic antibiotics, VTE prophylaxis, progressive ambulation and ADL's and discharge planning. The patient is planning to be discharged home with home health services ? ? ?Patient's anticipated LOS is less than 2 midnights, meeting these requirements: ?- Younger than 65 ?- Lives within 1 hour of care ?- Has a competent adult at home to recover with post-op recover ?- NO history of ? - Chronic pain requiring opiods ? - Diabetes ? - Coronary Artery Disease ? - Heart failure ? - Heart attack ? - Stroke ? - DVT/VTE ? - Cardiac arrhythmia ? -  Respiratory Failure/COPD ? - Renal failure ? - Anemia ? - Advanced Liver disease ? ? ?

## 2021-09-01 ENCOUNTER — Observation Stay (HOSPITAL_COMMUNITY)
Admission: RE | Admit: 2021-09-01 | Discharge: 2021-09-02 | Disposition: A | Discharge status: Home Health | Payer: Managed Care, Other (non HMO) | Source: Ambulatory Visit | Attending: Orthopaedic Surgery | Admitting: Orthopaedic Surgery

## 2021-09-01 ENCOUNTER — Encounter (HOSPITAL_COMMUNITY): Payer: Self-pay | Admitting: Orthopaedic Surgery

## 2021-09-01 ENCOUNTER — Encounter (HOSPITAL_COMMUNITY)
Admission: RE | Disposition: A | Discharge status: Home Health | Payer: Self-pay | Source: Ambulatory Visit | Attending: Orthopaedic Surgery

## 2021-09-01 ENCOUNTER — Other Ambulatory Visit: Payer: Self-pay

## 2021-09-01 ENCOUNTER — Ambulatory Visit (HOSPITAL_BASED_OUTPATIENT_CLINIC_OR_DEPARTMENT_OTHER): Payer: Managed Care, Other (non HMO) | Admitting: Certified Registered Nurse Anesthetist

## 2021-09-01 ENCOUNTER — Ambulatory Visit (HOSPITAL_COMMUNITY): Payer: Managed Care, Other (non HMO) | Admitting: Certified Registered Nurse Anesthetist

## 2021-09-01 DIAGNOSIS — M1712 Unilateral primary osteoarthritis, left knee: Secondary | ICD-10-CM | POA: Diagnosis present

## 2021-09-01 DIAGNOSIS — I10 Essential (primary) hypertension: Secondary | ICD-10-CM

## 2021-09-01 DIAGNOSIS — E785 Hyperlipidemia, unspecified: Secondary | ICD-10-CM | POA: Diagnosis not present

## 2021-09-01 DIAGNOSIS — I160 Hypertensive urgency: Secondary | ICD-10-CM

## 2021-09-01 DIAGNOSIS — F17210 Nicotine dependence, cigarettes, uncomplicated: Secondary | ICD-10-CM | POA: Insufficient documentation

## 2021-09-01 DIAGNOSIS — Z79899 Other long term (current) drug therapy: Secondary | ICD-10-CM | POA: Diagnosis not present

## 2021-09-01 DIAGNOSIS — Z96652 Presence of left artificial knee joint: Secondary | ICD-10-CM

## 2021-09-01 HISTORY — PX: TOTAL KNEE ARTHROPLASTY: SHX125

## 2021-09-01 LAB — BASIC METABOLIC PANEL
Anion gap: 11 (ref 5–15)
BUN: 11 mg/dL (ref 6–20)
CO2: 22 mmol/L (ref 22–32)
Calcium: 8.8 mg/dL — ABNORMAL LOW (ref 8.9–10.3)
Chloride: 101 mmol/L (ref 98–111)
Creatinine, Ser: 0.6 mg/dL (ref 0.44–1.00)
GFR, Estimated: >60 mL/min (ref >60)
Glucose, Bld: 119 mg/dL — ABNORMAL HIGH (ref 70–99)
Potassium: 4.2 mmol/L (ref 3.5–5.1)
Sodium: 134 mmol/L — ABNORMAL LOW (ref 135–145)

## 2021-09-01 LAB — ABO/RH: ABO/RH(D): A POS

## 2021-09-01 SURGERY — ARTHROPLASTY, KNEE, TOTAL
Anesthesia: General | Site: Knee | Laterality: Left

## 2021-09-01 MED ORDER — LABETALOL HCL 5 MG/ML IV SOLN
INTRAVENOUS | Status: AC
  Filled 2021-09-01: qty 4

## 2021-09-01 MED ORDER — LACTATED RINGERS IV SOLN: INTRAVENOUS | Status: DC

## 2021-09-01 MED ORDER — HYDRALAZINE HCL 20 MG/ML IJ SOLN
INTRAMUSCULAR | Status: AC
  Filled 2021-09-01: qty 1

## 2021-09-01 MED ORDER — PROPOFOL 10 MG/ML IV BOLUS
INTRAVENOUS | Status: DC | PRN
  Administered 2021-09-01: 200 mg via INTRAVENOUS

## 2021-09-01 MED ORDER — ESMOLOL HCL 100 MG/10ML IV SOLN
INTRAVENOUS | Status: AC
  Filled 2021-09-01: qty 10

## 2021-09-01 MED ORDER — 0.9 % SODIUM CHLORIDE (POUR BTL) OPTIME
TOPICAL | Status: DC | PRN
  Administered 2021-09-01: 1000 mL

## 2021-09-01 MED ORDER — PROPOFOL 10 MG/ML IV BOLUS
INTRAVENOUS | Status: AC
  Filled 2021-09-01: qty 20

## 2021-09-01 MED ORDER — ENALAPRIL MALEATE 2.5 MG PO TABS
2.5000 mg | ORAL_TABLET | Freq: Every day | ORAL | Status: DC
  Administered 2021-09-01 – 2021-09-02 (×2): 2.5 mg via ORAL
  Filled 2021-09-01 (×2): qty 1

## 2021-09-01 MED ORDER — LABETALOL HCL 5 MG/ML IV SOLN
10.0000 mg | INTRAVENOUS | Status: DC | PRN
  Administered 2021-09-01: 10 mg via INTRAVENOUS
  Filled 2021-09-01: qty 4

## 2021-09-01 MED ORDER — MENTHOL 3 MG MT LOZG: 1.0000 | LOZENGE | OROMUCOSAL | Status: DC | PRN

## 2021-09-01 MED ORDER — KETOROLAC TROMETHAMINE 15 MG/ML IJ SOLN
15.0000 mg | Freq: Four times a day (QID) | INTRAMUSCULAR | Status: AC
  Administered 2021-09-01 – 2021-09-02 (×4): 15 mg via INTRAVENOUS
  Filled 2021-09-01 (×4): qty 1

## 2021-09-01 MED ORDER — OXYCODONE HCL 5 MG PO TABS: 5.0000 mg | ORAL_TABLET | Freq: Once | ORAL | Status: DC | PRN

## 2021-09-01 MED ORDER — ALUM & MAG HYDROXIDE-SIMETH 200-200-20 MG/5ML PO SUSP: 30.0000 mL | ORAL | Status: DC | PRN

## 2021-09-01 MED ORDER — TRANEXAMIC ACID-NACL 1000-0.7 MG/100ML-% IV SOLN
1000.0000 mg | INTRAVENOUS | Status: AC
  Administered 2021-09-01: 1000 mg via INTRAVENOUS
  Filled 2021-09-01: qty 100

## 2021-09-01 MED ORDER — BUPIVACAINE LIPOSOME 1.3 % IJ SUSP: 20.0000 mL | Freq: Once | INTRAMUSCULAR | Status: DC

## 2021-09-01 MED ORDER — PROPOFOL 1000 MG/100ML IV EMUL
INTRAVENOUS | Status: AC
  Filled 2021-09-01: qty 100

## 2021-09-01 MED ORDER — SODIUM CHLORIDE 0.9 % IR SOLN
Status: DC | PRN
  Administered 2021-09-01: 3000 mL

## 2021-09-01 MED ORDER — DEXMEDETOMIDINE (PRECEDEX) IN NS 20 MCG/5ML (4 MCG/ML) IV SYRINGE
PREFILLED_SYRINGE | INTRAVENOUS | Status: DC | PRN
  Administered 2021-09-01 (×2): 8 ug via INTRAVENOUS

## 2021-09-01 MED ORDER — HYDROCODONE-ACETAMINOPHEN 5-325 MG PO TABS
1.0000 | ORAL_TABLET | ORAL | Status: DC | PRN
  Administered 2021-09-01: 2 via ORAL
  Filled 2021-09-01: qty 2

## 2021-09-01 MED ORDER — DOCUSATE SODIUM 100 MG PO CAPS
100.0000 mg | ORAL_CAPSULE | Freq: Two times a day (BID) | ORAL | Status: DC
  Filled 2021-09-01 (×2): qty 1

## 2021-09-01 MED ORDER — METHOCARBAMOL 500 MG IVPB - SIMPLE MED
INTRAVENOUS | Status: AC
  Filled 2021-09-01: qty 50

## 2021-09-01 MED ORDER — BUPIVACAINE-EPINEPHRINE 0.5% -1:200000 IJ SOLN
INTRAMUSCULAR | Status: DC | PRN
  Administered 2021-09-01: 30 mL

## 2021-09-01 MED ORDER — LIDOCAINE HCL (PF) 2 % IJ SOLN
INTRAMUSCULAR | Status: AC
  Filled 2021-09-01: qty 5

## 2021-09-01 MED ORDER — FENTANYL CITRATE PF 50 MCG/ML IJ SOSY
50.0000 ug | PREFILLED_SYRINGE | INTRAMUSCULAR | Status: DC
  Administered 2021-09-01: 100 ug via INTRAVENOUS
  Filled 2021-09-01: qty 2

## 2021-09-01 MED ORDER — LIDOCAINE 2% (20 MG/ML) 5 ML SYRINGE
INTRAMUSCULAR | Status: DC | PRN
  Administered 2021-09-01: 100 mg via INTRAVENOUS

## 2021-09-01 MED ORDER — LABETALOL HCL 5 MG/ML IV SOLN
5.0000 mg | INTRAVENOUS | Status: AC | PRN
  Administered 2021-09-01 (×2): 5 mg via INTRAVENOUS

## 2021-09-01 MED ORDER — ESMOLOL HCL 100 MG/10ML IV SOLN
INTRAVENOUS | Status: DC | PRN
  Administered 2021-09-01: 20 mg via INTRAVENOUS

## 2021-09-01 MED ORDER — SODIUM CHLORIDE (PF) 0.9 % IJ SOLN
INTRAMUSCULAR | Status: AC
  Filled 2021-09-01: qty 30

## 2021-09-01 MED ORDER — HYDROMORPHONE HCL 1 MG/ML IJ SOLN
0.2500 mg | INTRAMUSCULAR | Status: DC | PRN
  Administered 2021-09-01 (×4): 0.5 mg via INTRAVENOUS

## 2021-09-01 MED ORDER — ONDANSETRON HCL 4 MG/2ML IJ SOLN
INTRAMUSCULAR | Status: DC | PRN
  Administered 2021-09-01: 4 mg via INTRAVENOUS

## 2021-09-01 MED ORDER — BUPIVACAINE LIPOSOME 1.3 % IJ SUSP
INTRAMUSCULAR | Status: AC
  Filled 2021-09-01: qty 20

## 2021-09-01 MED ORDER — METHOCARBAMOL 500 MG PO TABS
500.0000 mg | ORAL_TABLET | Freq: Four times a day (QID) | ORAL | Status: DC | PRN
  Administered 2021-09-01 – 2021-09-02 (×3): 500 mg via ORAL
  Filled 2021-09-01 (×3): qty 1

## 2021-09-01 MED ORDER — PHENOL 1.4 % MT LIQD: 1.0000 | OROMUCOSAL | Status: DC | PRN

## 2021-09-01 MED ORDER — HYDROMORPHONE HCL 2 MG/ML IJ SOLN
INTRAMUSCULAR | Status: AC
  Filled 2021-09-01: qty 1

## 2021-09-01 MED ORDER — DIPHENHYDRAMINE HCL 12.5 MG/5ML PO ELIX: 12.5000 mg | ORAL_SOLUTION | ORAL | Status: DC | PRN

## 2021-09-01 MED ORDER — CEFAZOLIN SODIUM-DEXTROSE 2-4 GM/100ML-% IV SOLN
INTRAVENOUS | Status: AC
  Filled 2021-09-01: qty 100

## 2021-09-01 MED ORDER — LACTATED RINGERS IV BOLUS
250.0000 mL | Freq: Once | INTRAVENOUS | Status: AC
  Administered 2021-09-01: 250 mL via INTRAVENOUS

## 2021-09-01 MED ORDER — LACTATED RINGERS IV BOLUS
500.0000 mL | Freq: Once | INTRAVENOUS | Status: AC
  Administered 2021-09-01: 500 mL via INTRAVENOUS

## 2021-09-01 MED ORDER — TRANEXAMIC ACID 1000 MG/10ML IV SOLN
INTRAVENOUS | Status: DC | PRN
  Administered 2021-09-01: 2000 mg via TOPICAL

## 2021-09-01 MED ORDER — ROPIVACAINE HCL 7.5 MG/ML IJ SOLN
INTRAMUSCULAR | Status: DC | PRN
  Administered 2021-09-01: 20 mL via PERINEURAL

## 2021-09-01 MED ORDER — ACETAMINOPHEN 500 MG PO TABS
500.0000 mg | ORAL_TABLET | Freq: Four times a day (QID) | ORAL | Status: DC
  Administered 2021-09-01: 500 mg via ORAL
  Filled 2021-09-01 (×2): qty 1

## 2021-09-01 MED ORDER — ACETAMINOPHEN 325 MG PO TABS: 325.0000 mg | ORAL_TABLET | Freq: Four times a day (QID) | ORAL | Status: DC | PRN

## 2021-09-01 MED ORDER — METHOCARBAMOL 500 MG IVPB - SIMPLE MED
500.0000 mg | Freq: Four times a day (QID) | INTRAVENOUS | Status: DC | PRN
  Administered 2021-09-01: 500 mg via INTRAVENOUS
  Filled 2021-09-01: qty 50

## 2021-09-01 MED ORDER — ONDANSETRON HCL 4 MG/2ML IJ SOLN
INTRAMUSCULAR | Status: AC
  Filled 2021-09-01: qty 2

## 2021-09-01 MED ORDER — TIZANIDINE HCL 4 MG PO TABS: 4.0000 mg | ORAL_TABLET | Freq: Four times a day (QID) | ORAL | 1 refills | Status: DC | PRN

## 2021-09-01 MED ORDER — HYDROCODONE-ACETAMINOPHEN 5-325 MG PO TABS: 1.0000 | ORAL_TABLET | Freq: Four times a day (QID) | ORAL | 0 refills | Status: DC | PRN

## 2021-09-01 MED ORDER — MORPHINE SULFATE (PF) 2 MG/ML IV SOLN
INTRAVENOUS | Status: AC
  Filled 2021-09-01: qty 1

## 2021-09-01 MED ORDER — MIDAZOLAM HCL 2 MG/2ML IJ SOLN
1.0000 mg | INTRAMUSCULAR | Status: DC
  Administered 2021-09-01: 2 mg via INTRAVENOUS
  Filled 2021-09-01: qty 2

## 2021-09-01 MED ORDER — ONDANSETRON HCL 4 MG/2ML IJ SOLN: 4.0000 mg | Freq: Four times a day (QID) | INTRAMUSCULAR | Status: DC | PRN

## 2021-09-01 MED ORDER — CEFAZOLIN SODIUM-DEXTROSE 2-4 GM/100ML-% IV SOLN
2.0000 g | Freq: Four times a day (QID) | INTRAVENOUS | Status: AC
  Administered 2021-09-01 (×2): 2 g via INTRAVENOUS
  Filled 2021-09-01: qty 100

## 2021-09-01 MED ORDER — FENTANYL CITRATE (PF) 100 MCG/2ML IJ SOLN
INTRAMUSCULAR | Status: AC
  Filled 2021-09-01: qty 2

## 2021-09-01 MED ORDER — MORPHINE SULFATE (PF) 2 MG/ML IV SOLN
0.5000 mg | INTRAVENOUS | Status: DC | PRN
  Administered 2021-09-01 (×2): 1 mg via INTRAVENOUS
  Filled 2021-09-01: qty 1

## 2021-09-01 MED ORDER — ONDANSETRON HCL 4 MG PO TABS
4.0000 mg | ORAL_TABLET | Freq: Four times a day (QID) | ORAL | Status: DC | PRN
Start: 2021-09-01 — End: 2021-09-02

## 2021-09-01 MED ORDER — TRANEXAMIC ACID-NACL 1000-0.7 MG/100ML-% IV SOLN
INTRAVENOUS | Status: AC
  Filled 2021-09-01: qty 100

## 2021-09-01 MED ORDER — METOCLOPRAMIDE HCL 5 MG PO TABS: 5.0000 mg | ORAL_TABLET | Freq: Three times a day (TID) | ORAL | Status: DC | PRN

## 2021-09-01 MED ORDER — TRANEXAMIC ACID-NACL 1000-0.7 MG/100ML-% IV SOLN
1000.0000 mg | Freq: Once | INTRAVENOUS | Status: AC
  Administered 2021-09-01: 1000 mg via INTRAVENOUS

## 2021-09-01 MED ORDER — METOCLOPRAMIDE HCL 5 MG/ML IJ SOLN: 5.0000 mg | Freq: Three times a day (TID) | INTRAMUSCULAR | Status: DC | PRN

## 2021-09-01 MED ORDER — LABETALOL HCL 5 MG/ML IV SOLN
INTRAVENOUS | Status: DC | PRN
  Administered 2021-09-01 (×3): 5 mg via INTRAVENOUS

## 2021-09-01 MED ORDER — OXYCODONE HCL 5 MG/5ML PO SOLN: 5.0000 mg | Freq: Once | ORAL | Status: DC | PRN

## 2021-09-01 MED ORDER — HYDROMORPHONE HCL 1 MG/ML IJ SOLN
INTRAMUSCULAR | Status: DC | PRN
Start: 2021-09-01 — End: 2021-09-01
  Administered 2021-09-01 (×4): .5 mg via INTRAVENOUS

## 2021-09-01 MED ORDER — ASPIRIN 81 MG PO CHEW
81.0000 mg | CHEWABLE_TABLET | Freq: Two times a day (BID) | ORAL | Status: DC
  Administered 2021-09-02: 81 mg via ORAL
  Filled 2021-09-01: qty 1

## 2021-09-01 MED ORDER — FENTANYL CITRATE (PF) 100 MCG/2ML IJ SOLN
INTRAMUSCULAR | Status: DC | PRN
  Administered 2021-09-01 (×4): 50 ug via INTRAVENOUS

## 2021-09-01 MED ORDER — SODIUM CHLORIDE 0.9 % IV SOLN
2000.0000 mg | INTRAVENOUS | Status: DC
  Filled 2021-09-01: qty 20

## 2021-09-01 MED ORDER — SODIUM CHLORIDE (PF) 0.9 % IJ SOLN
INTRAMUSCULAR | Status: DC | PRN
  Administered 2021-09-01: 30 mL

## 2021-09-01 MED ORDER — HYDROMORPHONE HCL 1 MG/ML IJ SOLN
INTRAMUSCULAR | Status: AC
  Filled 2021-09-01: qty 2

## 2021-09-01 MED ORDER — POVIDONE-IODINE 10 % EX SWAB
2.0000 "application " | Freq: Once | CUTANEOUS | Status: AC
  Administered 2021-09-01: 2 via TOPICAL

## 2021-09-01 MED ORDER — BISACODYL 5 MG PO TBEC: 5.0000 mg | DELAYED_RELEASE_TABLET | Freq: Every day | ORAL | Status: DC | PRN

## 2021-09-01 MED ORDER — ONDANSETRON HCL 4 MG/2ML IJ SOLN: 4.0000 mg | Freq: Once | INTRAMUSCULAR | Status: DC | PRN

## 2021-09-01 MED ORDER — CEFAZOLIN SODIUM-DEXTROSE 2-4 GM/100ML-% IV SOLN
2.0000 g | INTRAVENOUS | Status: AC
  Administered 2021-09-01: 2 g via INTRAVENOUS
  Filled 2021-09-01: qty 100

## 2021-09-01 MED ORDER — BUPIVACAINE-EPINEPHRINE (PF) 0.5% -1:200000 IJ SOLN
INTRAMUSCULAR | Status: AC
  Filled 2021-09-01: qty 30

## 2021-09-01 MED ORDER — BUPIVACAINE LIPOSOME 1.3 % IJ SUSP
INTRAMUSCULAR | Status: DC | PRN
Start: 2021-09-01 — End: 2021-09-01
  Administered 2021-09-01: 20 mL

## 2021-09-01 MED ORDER — ASPIRIN EC 81 MG PO TBEC: 81.0000 mg | DELAYED_RELEASE_TABLET | Freq: Two times a day (BID) | ORAL | 0 refills | Status: DC

## 2021-09-01 MED ORDER — HYDROCODONE-ACETAMINOPHEN 7.5-325 MG PO TABS
1.0000 | ORAL_TABLET | ORAL | Status: DC | PRN
  Administered 2021-09-02 (×4): 2 via ORAL
  Filled 2021-09-01 (×4): qty 2

## 2021-09-01 MED ORDER — HYDRALAZINE HCL 20 MG/ML IJ SOLN
10.0000 mg | Freq: Once | INTRAMUSCULAR | Status: AC
  Administered 2021-09-01: 10 mg via INTRAVENOUS

## 2021-09-01 SURGICAL SUPPLY — 55 items
ATTUNE PSFEM LTSZ6 NARCEM KNEE (Femur) ×1 IMPLANT
ATTUNE PSRP INSR SZ6 5 KNEE (Insert) ×1 IMPLANT
BAG COUNTER SPONGE SURGICOUNT (BAG) ×2 IMPLANT
BAG DECANTER FOR FLEXI CONT (MISCELLANEOUS) ×2 IMPLANT
BAG ZIPLOCK 12X15 (MISCELLANEOUS) ×2 IMPLANT
BASE TIBIA ATTUNE KNEE SYS SZ6 (Knees) IMPLANT
BLADE SAGITTAL 25.0X1.19X90 (BLADE) ×2 IMPLANT
BLADE SAW SGTL 11.0X1.19X90.0M (BLADE) ×2 IMPLANT
BLADE SURG SZ10 CARB STEEL (BLADE) ×2 IMPLANT
BNDG ELASTIC 6X5.8 VLCR STR LF (GAUZE/BANDAGES/DRESSINGS) ×2 IMPLANT
BOOTIES KNEE HIGH SLOAN (MISCELLANEOUS) ×2 IMPLANT
BOWL SMART MIX CTS (DISPOSABLE) ×2 IMPLANT
CEMENT HV SMART SET (Cement) ×4 IMPLANT
COVER SURGICAL LIGHT HANDLE (MISCELLANEOUS) ×2 IMPLANT
CUFF TOURN SGL QUICK 34 (TOURNIQUET CUFF) ×1
CUFF TRNQT CYL 34X4.125X (TOURNIQUET CUFF) ×1 IMPLANT
DRAPE INCISE IOBAN 66X45 STRL (DRAPES) ×2 IMPLANT
DRAPE ORTHO 2.5IN SPLIT 77X108 (DRAPES) ×2 IMPLANT
DRAPE ORTHO SPLIT 77X108 STRL (DRAPES) ×2
DRAPE SHEET LG 3/4 BI-LAMINATE (DRAPES) ×2 IMPLANT
DRAPE U-SHAPE 47X51 STRL (DRAPES) ×2 IMPLANT
DRSG AQUACEL AG ADV 3.5X10 (GAUZE/BANDAGES/DRESSINGS) ×2 IMPLANT
DURAPREP 26ML APPLICATOR (WOUND CARE) ×4 IMPLANT
ELECT REM PT RETURN 15FT ADLT (MISCELLANEOUS) ×2 IMPLANT
GLOVE SRG 8 PF TXTR STRL LF DI (GLOVE) ×2 IMPLANT
GLOVE SURG ENC MOIS LTX SZ8 (GLOVE) ×4 IMPLANT
GLOVE SURG UNDER POLY LF SZ8 (GLOVE) ×2
GOWN STRL REUS W/ TWL XL LVL3 (GOWN DISPOSABLE) ×2 IMPLANT
GOWN STRL REUS W/TWL XL LVL3 (GOWN DISPOSABLE) ×2
HANDPIECE INTERPULSE COAX TIP (DISPOSABLE) ×1
HOLDER FOLEY CATH W/STRAP (MISCELLANEOUS) IMPLANT
HOOD PEEL AWAY FLYTE STAYCOOL (MISCELLANEOUS) ×6 IMPLANT
KIT TURNOVER KIT A (KITS) IMPLANT
MANIFOLD NEPTUNE II (INSTRUMENTS) ×2 IMPLANT
NEEDLE HYPO 22GX1.5 SAFETY (NEEDLE) ×2 IMPLANT
NS IRRIG 1000ML POUR BTL (IV SOLUTION) ×2 IMPLANT
PACK TOTAL KNEE CUSTOM (KITS) ×2 IMPLANT
PAD ARMBOARD 7.5X6 YLW CONV (MISCELLANEOUS) ×2 IMPLANT
PATELLA MEDIAL ATTUN 35MM KNEE (Knees) ×1 IMPLANT
PROTECTOR NERVE ULNAR (MISCELLANEOUS) ×2 IMPLANT
SET HNDPC FAN SPRY TIP SCT (DISPOSABLE) ×1 IMPLANT
SPIKE FLUID TRANSFER (MISCELLANEOUS) ×2 IMPLANT
SPONGE T-LAP 18X18 ~~LOC~~+RFID (SPONGE) ×2 IMPLANT
STRIP CLOSURE SKIN 1/2X4 (GAUZE/BANDAGES/DRESSINGS) ×1 IMPLANT
SUT ETHIBOND NAB CT1 #1 30IN (SUTURE) ×4 IMPLANT
SUT VIC AB 0 CT1 36 (SUTURE) ×2 IMPLANT
SUT VIC AB 2-0 CT1 27 (SUTURE) ×1
SUT VIC AB 2-0 CT1 TAPERPNT 27 (SUTURE) ×1 IMPLANT
SUT VICRYL AB 3-0 FS1 BRD 27IN (SUTURE) ×2 IMPLANT
SUT VLOC 180 0 24IN GS25 (SUTURE) ×2 IMPLANT
TIBIA ATTUNE KNEE SYS BASE SZ6 (Knees) ×2 IMPLANT
TRAY FOLEY MTR SLVR 16FR STAT (SET/KITS/TRAYS/PACK) IMPLANT
WATER STERILE IRR 1000ML POUR (IV SOLUTION) ×3 IMPLANT
WRAP KNEE MAXI GEL POST OP (GAUZE/BANDAGES/DRESSINGS) ×2 IMPLANT
YANKAUER SUCT BULB TIP NO VENT (SUCTIONS) ×2 IMPLANT

## 2021-09-01 NOTE — Anesthesia Procedure Notes (Signed)
Procedure Name: LMA Insertion ?Date/Time: 09/01/2021 9:53 AM ?Performed by: Maxwell Caul, CRNA ?Pre-anesthesia Checklist: Patient identified, Emergency Drugs available, Suction available and Patient being monitored ?Patient Re-evaluated:Patient Re-evaluated prior to induction ?Oxygen Delivery Method: Circle system utilized ?Preoxygenation: Pre-oxygenation with 100% oxygen ?Induction Type: IV induction ?LMA: LMA with gastric port inserted ?LMA Size: 4.0 ?Placement Confirmation: positive ETCO2 and breath sounds checked- equal and bilateral ?Tube secured with: Tape ?Dental Injury: Teeth and Oropharynx as per pre-operative assessment  ? ? ? ? ?

## 2021-09-01 NOTE — Consult Note (Signed)
[facility] ?Medical Consultation ? ?Arla Boutwell GMW:[id number] DOB: 07-01-1965 DOA: 09/01/2021 ?PCP: Jake Shark, PA-C  ? ?Requesting physician: Elodia Florence PA ?Date of consultation: 09/01/2021 ?Reason for consultation: uncontrolled HTN ? ?Impression/Recommendations ?Principal Problem: ?  Primary osteoarthritis of left knee ?Active Problems: ?  S/P total knee arthroplasty, left ? ? ? ?Hypertensive urgency  ?Per chart review from outpatient visit it appears that her blood pressure was well controlled on enalapril 2.5 mg even in the pre-operative timeframe.  This could be due to the stress of her surgery. ?-Continue daily enalapril 2.5 mg. Check BMP to assess renal function. ?-start PRN IV labetalol 10 mg q2hr for BP of >170/110 and follow BP closely overnight. She may need to increase home enalapril to 5mg .  ? ?2.  Osteoarthritis of the left knee s/p total knee arthroplasty ?-Management per primary Ortho team ? ?I will followup again tomorrow. Please contact me if I can be of assistance in the meanwhile. Thank you for this consultation. ? ?Chief Complaint:  ? ?HPI:  ?56 year old female with osteoarthritis of the left knee and hypertension was admitted today by orthopedic surgery for left total knee arthroplasty for severe osteoarthritis. ?Throughout today her blood pressure remained elevated despite IV hydralazine 10mg , IV labetalol 5mg  x2 intra-operatively. BP now up to 190/110 so hospitalist was called for management. ? ?Patient is on enalapril 2.5 mg daily for hypertension.  States she used to be inconsistent with her medication but recently has been taking it regularly.  However she did not take a dose of it prior to admission since she was told not to.  On chart review, it appears that she has controlled hypertension at outpatient visits. ?Postoperatively, she reports that her pain is well controlled.  Only symptoms is a new headache that developed about an hour ago.  Denies any blurry vision.  No  chest pain or shortness of breath. ? ?Review of Systems:  ?All pertinent negatives and positives stated above ? ?Past Medical History:  ?Diagnosis Date  ? Alcohol use   ? Aortic atherosclerosis (HCC) 12/06/2019  ? Arthritis   ? self report  ? Gallstones 05/08/2018  ? Found on  ? Gastroesophageal reflux disease 12/20/2018  ? Headache   ? Hyperlipidemia   ? Hypertension   ? Liver lesion 12/06/2019  ? ?Past Surgical History:  ?Procedure Laterality Date  ? CHOLECYSTECTOMY    ? FOOT SURGERY  2005  ? FOOT SURGERY Left 2009  ? KNEE ARTHROCENTESIS  08/2018  ? WISDOM TOOTH EXTRACTION    ? ?Social History:  reports that she has been smoking cigarettes. She has a 20.00 pack-year smoking history. She has never used smokeless tobacco. She reports current alcohol use of about 2.0 standard drinks per week. She reports that she does not use drugs. ? ?No Known Allergies ?Family History  ?Problem Relation Age of Onset  ? Diabetes Mother   ? Thyroid disease Sister   ? Diabetes Sister   ? Thyroid disease Sister   ? Diabetes Brother   ? Colon polyps Neg Hx   ? Colon cancer Neg Hx   ? Esophageal cancer Neg Hx   ? Rectal cancer Neg Hx   ? Stomach cancer Neg Hx   ? ? ?Prior to Admission medications   ?Medication Sig Start Date End Date Taking? Authorizing Provider  ?aspirin EC 81 MG tablet Take 1 tablet (81 mg total) by mouth 2 (two) times daily after a meal. For 2 weeks then 1 x a day  for 2 weeks for blood clot prevention. 09/01/21 09/01/22 Yes Elodia Florence, PA-C  ?enalapril (VASOTEC) 2.5 MG tablet Take 1 tablet (2.5 mg total) by mouth daily. 08/18/21  Yes Burnard Hawthorne B, PA-C  ?HYDROcodone-acetaminophen (NORCO/VICODIN) 5-325 MG tablet Take 1-2 tablets by mouth every 6 (six) hours as needed for moderate pain or severe pain (post op pain). 09/01/21 09/01/22 Yes Elodia Florence, PA-C  ?tiZANidine (ZANAFLEX) 4 MG tablet Take 1 tablet (4 mg total) by mouth every 6 (six) hours as needed for muscle spasms. 09/01/21 09/01/22 Yes Elodia Florence, PA-C   ? ?Physical Exam: ?Blood pressure (!) 191/113, pulse 82, temperature 97.9 ?F (36.6 ?C), temperature source Oral, resp. rate 16, height 5\' 7"  (1.702 m), weight 115.3 kg, SpO2 98 %. ?Vitals:  ? 09/01/21 1700 09/01/21 1727  ?BP: (!) 177/104 (!) 191/113  ?Pulse:  82  ?Resp:  16  ?Temp: 97.8 ?F (36.6 ?C) 97.9 ?F (36.6 ?C)  ?SpO2: 98% 98%  ? ?Constitutional: NAD, calm, comfortable, well-appearing obese female laying at approximately 40 degree incline in bed ?Eyes: PERRL, lids and conjunctivae normal ?ENMT: Mucous membranes are moist.  ?Neck: normal, supple ?Respiratory: clear to auscultation bilaterally, no wheezing, no crackles. Normal respiratory effort.  ?Cardiovascular: Regular rate and rhythm, no murmurs / rubs / gallops. No extremity edema.   ?Abdomen: no tenderness, no masses palpated. No hepatosplenomegaly. Bowel sounds positive.  ?Musculoskeletal: no clubbing / cyanosis. No joint deformity upper and lower extremities.  Left leg is wrapped in Ace bandage s/p arthroplasty  ?skin: no rashes, lesions, ulcers. No induration ?Neurologic: CN 2-12 grossly intact.  Strength 5/5 in all 4.  ?Psychiatric: Normal judgment and insight. Alert and oriented x 3. Normal mood and frequently joking around. ? ?Labs on Admission:  ?Basic Metabolic Panel: ?No results for input(s): NA, K, CL, CO2, GLUCOSE, BUN, CREATININE, CALCIUM, MG, PHOS in the last 168 hours. ?Liver Function Tests: ?No results for input(s): AST, ALT, ALKPHOS, BILITOT, PROT, ALBUMIN in the last 168 hours. ?No results for input(s): LIPASE, AMYLASE in the last 168 hours. ?No results for input(s): AMMONIA in the last 168 hours. ?CBC: ?No results for input(s): WBC, NEUTROABS, HGB, HCT, MCV, PLT in the last 168 hours. ?Cardiac Enzymes: ?No results for input(s): CKTOTAL, CKMB, CKMBINDEX, TROPONINI in the last 168 hours. ?BNP: ?Invalid input(s): POCBNP ?CBG: ?No results for input(s): GLUCAP in the last 168 hours. ? ?Radiological Exams on Admission: ?No results  found. ? ? ? ?09/03/21 ?[facility] ? ? ?If 7PM-7AM, please contact night-coverage [email or web address] ?Password [email or web address] ?09/01/2021, 7:38 PM ? ? ? ? ?  ?

## 2021-09-01 NOTE — Transfer of Care (Signed)
Immediate Anesthesia Transfer of Care Note ? ?Patient: Kathryn Delgado ? ?Procedure(s) Performed: LEFT TOTAL KNEE ARTHROPLASTY (Left: Knee) ? ?Patient Location: PACU ? ?Anesthesia Type:General ? ?Level of Consciousness: awake, alert  and oriented ? ?Airway & Oxygen Therapy: Patient Spontanous Breathing and Patient connected to face mask oxygen ? ?Post-op Assessment: Report given to RN and Post -op Vital signs reviewed and stable ? ?Post vital signs: Reviewed and stable ? ?Last Vitals:  ?Vitals Value Taken Time  ?BP    ?Temp    ?Pulse    ?Resp    ?SpO2    ? ? ?Last Pain:  ?Vitals:  ? 09/01/21 0928  ?TempSrc:   ?PainSc: 0-No pain  ?   ? ?Patients Stated Pain Goal: 4 (09/01/21 0757) ? ?Complications: No notable events documented. ?

## 2021-09-01 NOTE — Anesthesia Preprocedure Evaluation (Addendum)
Anesthesia Evaluation  ?Patient identified by MRN, date of birth, ID band ?Patient awake ? ? ? ?Reviewed: ?Allergy & Precautions, NPO status , Patient's Chart, lab work & pertinent test results ? ?Airway ?Mallampati: II ? ?TM Distance: >3 FB ?Neck ROM: Full ? ? ? Dental ? ?(+) Dental Advisory Given, Poor Dentition, Missing,  ?  ?Pulmonary ?Current Smoker,  ?  ?Pulmonary exam normal ?breath sounds clear to auscultation ? ? ? ? ? ? Cardiovascular ?hypertension, Pt. on medications ?Normal cardiovascular exam ?Rhythm:Regular Rate:Normal ? ? ?  ?Neuro/Psych ? Headaches, negative psych ROS  ? GI/Hepatic ?GERD  Medicated,(+)  ?  ? substance abuse ? alcohol use,   ?Endo/Other  ?Morbid obesityHyperlipidemia ? Renal/GU ?negative Renal ROS  ?negative genitourinary ?  ?Musculoskeletal ? ?(+) Arthritis , Osteoarthritis,  OA left knee  ? Abdominal ?(+) + obese,   ?Peds ? Hematology ?negative hematology ROS ?(+)   ?Anesthesia Other Findings ? ? Reproductive/Obstetrics ? ?  ? ? ? ? ? ? ? ? ? ? ? ? ? ?  ?  ? ? ? ? ? ? ? ?Anesthesia Physical ?Anesthesia Plan ? ?ASA: 3 ? ?Anesthesia Plan: General  ? ?Post-op Pain Management: Regional block*  ? ?Induction: Intravenous ? ?PONV Risk Score and Plan: 3 and Treatment may vary due to age or medical condition, Propofol infusion and Ondansetron ? ?Airway Management Planned: LMA ? ?Additional Equipment: None ? ?Intra-op Plan:  ? ?Post-operative Plan: Extubation in OR ? ?Informed Consent: I have reviewed the patients History and Physical, chart, labs and discussed the procedure including the risks, benefits and alternatives for the proposed anesthesia with the patient or authorized representative who has indicated his/her understanding and acceptance.  ? ? ? ?Dental advisory given ? ?Plan Discussed with: CRNA and Anesthesiologist ? ?Anesthesia Plan Comments:   ? ? ? ? ? ?Anesthesia Quick Evaluation ? ?

## 2021-09-01 NOTE — Anesthesia Procedure Notes (Signed)
Anesthesia Regional Block: Adductor canal block  ? ?Pre-Anesthetic Checklist: , timeout performed,  Correct Patient, Correct Site, Correct Laterality,  Correct Procedure, Correct Position, site marked,  Risks and benefits discussed,  Surgical consent,  Pre-op evaluation,  At surgeon's request and post-op pain management ? ?Laterality: Left ? ?Prep: chloraprep     ?  ?Needles:  ?Injection technique: Single-shot ? ?Needle Type: Echogenic Stimulator Needle   ? ? ?Needle Length: 10cm  ?Needle Gauge: 21  ? ?Needle insertion depth: 8 cm ? ? ?Additional Needles: ? ? ?Procedures:,,,, ultrasound used (permanent image in chart),,    ?Narrative:  ?Start time: 09/01/2021 9:18 AM ?End time: 09/01/2021 9:23 AM ?Injection made incrementally with aspirations every 5 mL. ? ?Performed by: Personally  ?Anesthesiologist: Mal Amabile, MD ? ?Additional Notes: ?Timeout performed. Patient sedated. Relevant anatomy ID'd using [place]. Incremental 2-73ml injection of LA with frequent aspiration. Patient tolerated procedure well. ? ? ? ?Left Adductor Canal Block ? ?

## 2021-09-01 NOTE — Plan of Care (Signed)
  Problem: Clinical Measurements: Goal: Ability to maintain clinical measurements within normal limits will improve Outcome: Progressing   Problem: Activity: Goal: Risk for activity intolerance will decrease Outcome: Progressing   Problem: Pain Managment: Goal: General experience of comfort will improve Outcome: Progressing   Problem: Safety: Goal: Ability to remain free from injury will improve Outcome: Progressing   

## 2021-09-01 NOTE — Anesthesia Postprocedure Evaluation (Signed)
Anesthesia Post Note ? ?Patient: Jackquline Branca ? ?Procedure(s) Performed: LEFT TOTAL KNEE ARTHROPLASTY (Left: Knee) ? ?  ? ?Patient location during evaluation: PACU ?Anesthesia Type: General ?Level of consciousness: awake and alert and oriented ?Pain management: pain level controlled ?Vital Signs Assessment: post-procedure vital signs reviewed and stable ?Respiratory status: spontaneous breathing, nonlabored ventilation and respiratory function stable ?Cardiovascular status: blood pressure returned to baseline and stable ?Postop Assessment: no apparent nausea or vomiting ?Anesthetic complications: no ? ? ?No notable events documented. ? ?Last Vitals:  ?Vitals:  ? 09/01/21 1200 09/01/21 1215  ?BP: (!) 171/115 (!) 186/113  ?Pulse: 85 79  ?Resp: 13 10  ?Temp:    ?SpO2: 92% 93%  ?  ?Last Pain:  ?Vitals:  ? 09/01/21 1223  ?TempSrc:   ?PainSc: 6   ? ? ?  ?  ?  ?  ?  ?  ? ?Leor Whyte A. ? ? ? ? ?

## 2021-09-01 NOTE — Op Note (Signed)
PREOP DIAGNOSIS: DJD LEFT KNEE ?POSTOP DIAGNOSIS:  same ?PROCEDURE: LEFT TKR ?ANESTHESIA: General and block ?ATTENDING SURGEON: Hessie Dibble ?ASSISTANT: Loni Dolly PA ? ?INDICATIONS FOR PROCEDURE: Kathryn Delgado is a 56 y.o. female who has struggled for a long time with pain due to degenerative arthritis of the left knee.  The patient has failed many conservative non-operative measures and at this point has pain which limits the ability to sleep and walk.  The patient is offered total knee replacement.  Informed operative consent was obtained after discussion of possible risks of anesthesia, infection, neurovascular injury, DVT, and death.  The importance of the post-operative rehabilitation protocol to optimize result was stressed extensively with the patient. ? ?SUMMARY OF FINDINGS AND PROCEDURE:  [name] was taken to the operative suite where under the above anesthesia a left knee replacement was performed.  There were advanced degenerative changes and the bone quality was good.  We used the DePuy Attune system and placed size 6 narrow femur, 6 tibia, 35 mm all polyethylene patella, and a size 5 mm spacer.  Loni Dolly PA-C assisted throughout and was invaluable to the completion of the case in that he helped retract and maintain exposure while I placed the components.  He also helped close thereby minimizing OR time.  The patient was admitted for appropriate post-op care to include perioperative antibiotics and mechanical and pharmacologic measures for DVT prophylaxis. ? ?DESCRIPTION OF PROCEDURE:  Lavett Lips was taken to the operative suite where the above anesthesia was applied.  The patient was positioned supine and prepped and draped in normal sterile fashion.  An appropriate time out was performed.  After the administration of kefzol pre-op antibiotic the leg was elevated and exsanguinated and a tourniquet inflated.  A standard longitudinal incision was made on the anterior knee.  Dissection was  carried down to the extensor mechanism.  All appropriate anti-infective measures were used including the pre-operative antibiotic, betadine impregnated drape, and closed hooded exhaust systems for each member of the surgical team.  A medial parapatellar incision was made in the extensor mechanism and the knee cap flipped and the knee flexed.  Some residual meniscal tissues were removed along with any remaining ACL/PCL tissue.  A guide was placed on the tibia and a flat cut was made on it's superior surface.  An intramedullary guide was placed in the femur and was utilized to make anterior and posterior cuts creating an appropriate flexion gap.  A second intramedullary guide was placed in the femur to make a distal cut properly balancing the knee with an extension gap equal to the flexion gap.  The three bones sized to the above mentioned sizes and the appropriate guides were placed and utilized.  A trial reduction was done and the knee easily came to full extension and the patella tracked well on flexion.  The trial components were removed and all bones were cleaned with pulsatile lavage and then dried thoroughly.  Cement was mixed and was pressurized onto the bones followed by placement of the aforementioned components.  Excess cement was trimmed and pressure was held on the components until the cement had hardened.  The tourniquet was deflated and a small amount of bleeding was controlled with cautery and pressure.  The knee was irrigated thoroughly.  The extensor mechanism was re-approximated with #1 ethibond in interrupted fashion.  The knee was flexed and the repair was solid.  The subcutaneous tissues were re-approximated with #0 and #2-0 vicryl and the skin closed  with a subcuticular stitch and steristrips.  A sterile dressing was applied.  Intraoperative fluids, EBL, and tourniquet time can be obtained from anesthesia records. ? ?DISPOSITION:  The patient was taken to recovery room in stable condition and  scheduled to potentially go home same day depending on ability to walk and tolerate liquids. ? ?Hessie Dibble ?09/01/2021, 11:16 AM  ?

## 2021-09-01 NOTE — Interval H&P Note (Signed)
History and Physical Interval Note: ? ?09/01/2021 ?9:01 AM ? ?Kathryn Delgado  has presented today for surgery, with the diagnosis of LEFT KNEE DEGENERATIVE JOINT DISEASE.  The various methods of treatment have been discussed with the patient and family. After consideration of risks, benefits and other options for treatment, the patient has consented to  Procedure(s): ?LEFT TOTAL KNEE ARTHROPLASTY (Left) as a surgical intervention.  The patient's history has been reviewed, patient examined, no change in status, stable for surgery.  I have reviewed the patient's chart and labs.  Questions were answered to the patient's satisfaction.   ? ? ?Kathryn Delgado ? ? ?

## 2021-09-01 NOTE — Evaluation (Signed)
Physical Therapy Evaluation ?Patient Details ?Name: Kathryn Delgado ?MRN: [id number] ?DOB: [birth year] ?Today's Date: 09/01/2021 ? ?History of Present Illness ? Patient is 56 y.o. female s/p Lt TKA on 09/01/21 with PMH significant for ETOH abuse, OA, HLD, HTN, liver lesion. ? ?  ?Clinical Impression ? Zacaria Pousson is a 56 y.o. female POD 0 s/p Lt TKA. Patient reports independence with mobility at baseline. Patient is now limited by functional impairments (see PT problem list below) and requires min assist for bed mobility and transfers with RW. Patient was able to move bed<>BSC with RW and min assist. Session limited by pain at Lt knee and significantly elevated BP throughout session. Unable to safely progress mobility due to HTN and pt assisted back to bed. Patient instructed in exercise to facilitate ROM. Patient will benefit from continued skilled PT interventions to address impairments and progress towards PLOF. Acute PT will follow to progress mobility and stair training in preparation for safe discharge home.    ?  ?VITALS: ? 09/01/21 1500 09/01/21 1502 09/01/21 1538 09/01/21 1547 09/01/21 1551  ?BP: (!)  167/111 (!)  168/101 (!)  189/122 (!) 185/107 (!) 181/119  ? ?  ? ?Recommendations for follow up therapy are one component of a multi-disciplinary discharge planning process, led by the attending physician.  Recommendations may be updated based on patient status, additional functional criteria and insurance authorization. ? ?Follow Up Recommendations Home health PT (pt has 15 steps to enter/exit home) ? ?  ?Assistance Recommended at Discharge Frequent or constant Supervision/Assistance  ?Patient can return home with the following ? A little help with walking and/or transfers;A little help with bathing/dressing/bathroom;Assistance with cooking/housework;Assist for transportation;Help with stairs or ramp for entrance;Direct supervision/assist for medications management ? ?  ?Equipment Recommendations None recommended  by PT  ?Recommendations for Other Services ?    ?  ?Functional Status Assessment Patient has had a recent decline in their functional status and demonstrates the ability to make significant improvements in function in a reasonable and predictable amount of time.  ? ?  ?Precautions / Restrictions Precautions ?Precautions: Fall ?Restrictions ?Weight Bearing Restrictions: No ?Other Position/Activity Restrictions: WBAT  ? ?  ? ?Mobility ? Bed Mobility ?Overal bed mobility: Needs Assistance ?Bed Mobility: Supine to Sit, Sit to Supine ?  ?  ?Supine to sit: HOB elevated, Min guard ?Sit to supine: Min assist, HOB elevated ?  ?General bed mobility comments: guarding for safety with pt taking extra time to pivot to EOB. Min assist to fully bring LE's onto bed. ?  ? ?Transfers ?Overall transfer level: Needs assistance ?Equipment used: Rolling walker (2 wheels) ?Transfers: Sit to/from Stand, Bed to chair/wheelchair/BSC ?Sit to Stand: Min assist, From elevated surface ?  ?Step pivot transfers: Min assist, From elevated surface ?  ?  ?  ?General transfer comment: cues for technique to rise to RW, pt steady once standing. min assist to mange walker with turn to move bed<>BSC. cues to extend Lt knee with sitting and for reach back to control lowering. ?  ? ?Ambulation/Gait ?  ?  ?  ?  ?  ?  ?  ?General Gait Details: deferred further mobility due to significant HTN ? ?Stairs ?  ?  ?  ?  ?  ? ?Wheelchair Mobility ?  ? ?Modified Rankin (Stroke Patients Only) ?  ? ?  ? ?Balance Overall balance assessment: Needs assistance ?Sitting-balance support: Feet supported ?Sitting balance-Leahy Scale: Good ?  ?  ?Standing balance support: Reliant on assistive device for  balance, During functional activity, Bilateral upper extremity supported ?Standing balance-Leahy Scale: Fair ?Standing balance comment: pt able to complete pericare with single UE support on RW in part stand ?  ?  ?  ?  ?  ?  ?  ?  ?  ?  ?  ?   ? ? ? ?Pertinent Vitals/Pain Pain  Assessment ?Pain Assessment: 0-10 ?Pain Score: 6  ?Pain Location: Lt knee ?Pain Descriptors / Indicators: Aching, Discomfort, Guarding ?Pain Intervention(s): Limited activity within patient's tolerance, Monitored during session, Repositioned, Ice applied  ? ? ?Home Living Family/patient expects to be discharged to:: Private residence ?Living Arrangements: Spouse/significant other ?Available Help at Discharge: Family ?Type of Home: House ?Home Access: Stairs to enter ?Entrance Stairs-Rails: Left ?Entrance Stairs-Number of Steps: 15 ?  ?Home Layout: One level ?Home Equipment: Agricultural consultant (2 wheels);Crutches ?   ?  ?Prior Function Prior Level of Function : Independent/Modified Independent ?  ?  ?  ?  ?  ?  ?Mobility Comments: works at [employer], is independent with no device ?ADLs Comments: independent ?  ? ? ?Hand Dominance  ? Dominant Hand: Left ? ?  ?Extremity/Trunk Assessment  ? Upper Extremity Assessment ?Upper Extremity Assessment: Overall WFL for tasks assessed ?  ? ?Lower Extremity Assessment ?Lower Extremity Assessment: LLE deficits/detail ?LLE Deficits / Details: good quad activation, no extensor lag with SLR ?LLE Sensation: WNL ?LLE Coordination: WNL ?  ? ?Cervical / Trunk Assessment ?Cervical / Trunk Assessment: Normal  ?Communication  ? Communication: No difficulties  ?Cognition Arousal/Alertness: Awake/alert ?Behavior During Therapy: [facility] for tasks assessed/performed ?Overall Cognitive Status: Within Functional Limits for tasks assessed ?  ?  ?  ?  ?  ?  ?  ?  ?  ?  ?  ?  ?  ?  ?  ?  ?  ?  ?  ? ?  ?General Comments   ? ?  ?Exercises Total Joint Exercises ?Heel Slides: AAROM, Left, 5 reps, Supine  ? ?Assessment/Plan  ?  ?PT Assessment Patient needs continued PT services  ?PT Problem List Decreased strength;Decreased range of motion;Decreased activity tolerance;Decreased balance;Decreased mobility;Decreased knowledge of use of DME;Decreased safety awareness;Decreased knowledge of  precautions;Obesity;Pain ? ?   ?  ?PT Treatment Interventions DME instruction;Gait training;Stair training;Functional mobility training;Therapeutic activities;Therapeutic exercise;Balance training;Neuromuscular re-education;Patient/family education   ? ?PT Goals (Current goals can be found in the Care Plan section)  ?Acute Rehab PT Goals ?Patient Stated Goal: get home and control pain ?PT Goal Formulation: With patient ?Time For Goal Achievement: 09/08/21 ?Potential to Achieve Goals: Good ? ?  ?Frequency 7X/week ?  ? ? ?Co-evaluation   ?  ?  ?  ?  ? ? ?  ?AM-PAC PT "6 Clicks" Mobility  ?Outcome Measure Help needed turning from your back to your side while in a flat bed without using bedrails?: None ?Help needed moving from lying on your back to sitting on the side of a flat bed without using bedrails?: A Little ?Help needed moving to and from a bed to a chair (including a wheelchair)?: A Little ?Help needed standing up from a chair using your arms (e.g., wheelchair or bedside chair)?: A Little ?Help needed to walk in hospital room?: A Little ?Help needed climbing 3-5 steps with a railing? : A Lot ?6 Click Score: 18 ? ?  ?End of Session Equipment Utilized During Treatment: Gait belt ?Activity Tolerance: Treatment limited secondary to medical complications (Comment) (HTN and pain) ?Patient left: in bed;with call bell/phone within reach;with  family/visitor present ?Nurse Communication: Mobility status ?PT Visit Diagnosis: Muscle weakness (generalized) (M62.81);Difficulty in walking, not elsewhere classified (R26.2) ?  ? ?Time: [id number] ?PT Time Calculation (min) (ACUTE ONLY): 24 min ? ? ?Charges:   PT Evaluation ?$PT Eval Low Complexity: 1 Low ?PT Treatments ?$Therapeutic Activity: 8-22 mins ?  ?   ? ? ?Renaldo Fiddlerachel Q. PT, DPT ?Acute Rehabilitation Services ?Office [phone] ?Pager [phone]  ? ?Anitra LauthRachel E Quinn-Brown ?09/01/2021, 4:24 PM ?

## 2021-09-01 NOTE — Progress Notes (Signed)
AssistedDr. Foster with left, adductor canal, ultrasound guided block. Side rails up, monitors on throughout procedure. See vital signs in flow sheet. Tolerated Procedure well. ? ?

## 2021-09-02 ENCOUNTER — Encounter (HOSPITAL_COMMUNITY): Payer: Self-pay | Admitting: Orthopaedic Surgery

## 2021-09-02 DIAGNOSIS — M1712 Unilateral primary osteoarthritis, left knee: Secondary | ICD-10-CM | POA: Diagnosis not present

## 2021-09-02 NOTE — Progress Notes (Signed)
Physical Therapy Treatment ?Patient Details ?Name: Kathryn Delgado ?MRN: [id number] ?DOB: 1966-02-09 ?Today's Date: 09/02/2021 ? ? ?History of Present Illness Patient is 56 y.o. female s/p Lt TKA on 09/01/21 with PMH significant for ETOH abuse, OA, HLD, HTN, liver lesion. ? ?  ?PT Comments  ? ? Patient making excellent progress with mobility and ambulated ~90' with min guard and cues for safety with RW. Pt was able to initiate stair training this session and verbalized safe guarding position for family to provide and demonstrated safe sequencing. Educated on HEP at EOS. Pt will benefit from follow up session with family training in preparation for safe discharge home. ? ?  ?Recommendations for follow up therapy are one component of a multi-disciplinary discharge planning process, led by the attending physician.  Recommendations may be updated based on patient status, additional functional criteria and insurance authorization. ? ?Follow Up Recommendations ? Home health PT (pt has 15 steps to enter/exit home) ?  ?  ?Assistance Recommended at Discharge Frequent or constant Supervision/Assistance  ?Patient can return home with the following A little help with walking and/or transfers;A little help with bathing/dressing/bathroom;Assistance with cooking/housework;Assist for transportation;Help with stairs or ramp for entrance;Direct supervision/assist for medications management ?  ?Equipment Recommendations ? None recommended by PT  ?  ?Recommendations for Other Services   ? ? ?  ?Precautions / Restrictions Precautions ?Precautions: Fall ?Restrictions ?Weight Bearing Restrictions: No ?Other Position/Activity Restrictions: WBAT  ?  ? ?Mobility ? Bed Mobility ?Overal bed mobility: Needs Assistance ?Bed Mobility: Supine to Sit ?  ?  ?Supine to sit: HOB elevated, Min guard ?  ?  ?General bed mobility comments: guarding/supervision to move to EOB. HOB slightly elevated, instructed pt on sing belt to assist Lt LE off EOB. ?   ? ?Transfers ?Overall transfer level: Needs assistance ?Equipment used: Rolling walker (2 wheels) ?Transfers: Sit to/from Stand, Bed to chair/wheelchair/BSC ?Sit to Stand: Min guard ?  ?  ?  ?  ?  ?General transfer comment: guarding for safety with power up from EOB and toilet. pt demonstrated good recall for safe hand placement. No assist needed. cues to extend Lt knee with sitting. ?  ? ?Ambulation/Gait ?Ambulation/Gait assistance: Min guard ?Gait Distance (Feet): 90 Feet ?Assistive device: Rolling walker (2 wheels) ?Gait Pattern/deviations: Step-to pattern, Decreased stride length, Decreased weight shift to left ?Gait velocity: decr ?  ?  ?General Gait Details: cues at start for safe mangement of RW, pt maintained safe proximity to RW. no buckling at Lt knee and no LOB noted. ? ? ?Stairs ?Stairs: Yes ?Stairs assistance: Min guard, Min assist ?Stair Management: One rail Left, Step to pattern, Sideways ?Number of Stairs: 4 (2x2) ?General stair comments: cues for safe step sequencing "up with good, down with bad" and for safe guarding for pt's girlfriend to provide. gaurding for safety on second bout, assist for walker position/management. ? ? ?Wheelchair Mobility ?  ? ?Modified Rankin (Stroke Patients Only) ?  ? ? ?  ?Balance Overall balance assessment: Needs assistance ?Sitting-balance support: Feet supported ?Sitting balance-Leahy Scale: Good ?  ?  ?Standing balance support: Reliant on assistive device for balance, During functional activity, Bilateral upper extremity supported ?Standing balance-Leahy Scale: Fair ?  ?  ?  ?  ?  ?  ?  ?  ?  ?  ?  ?  ?  ? ?  ?Cognition Arousal/Alertness: Awake/alert ?Behavior During Therapy: [facility] for tasks assessed/performed ?Overall Cognitive Status: Within Functional Limits for tasks assessed ?  ?  ?  ?  ?  ?  ?  ?  ?  ?  ?  ?  ?  ?  ?  ?  ?  ?  ?  ? ?  ?  Exercises Total Joint Exercises ?Ankle Circles/Pumps: AROM, Both, 10 reps, Seated ?Quad Sets: AROM, Both, 5 reps, Seated ?Heel  Slides: AROM, Left, 5 reps, Seated ? ?  ?General Comments   ?  ?  ? ?Pertinent Vitals/Pain Pain Assessment ?Pain Assessment: 0-10 ?Pain Location: Lt knee ?Pain Descriptors / Indicators: Aching, Discomfort, Guarding ?Pain Intervention(s): Limited activity within patient's tolerance, Monitored during session, Repositioned  ? ? ?Home Living   ?  ?  ?  ?  ?  ?  ?  ?  ?  ?   ?  ?Prior Function    ?  ?  ?   ? ?PT Goals (current goals can now be found in the care plan section) Acute Rehab PT Goals ?Patient Stated Goal: get home and control pain ?PT Goal Formulation: With patient ?Time For Goal Achievement: 09/08/21 ?Potential to Achieve Goals: Good ?Progress towards PT goals: Progressing toward goals ? ?  ?Frequency ? ? ? 7X/week ? ? ? ?  ?PT Plan Current plan remains appropriate  ? ? ?Co-evaluation   ?  ?  ?  ?  ? ?  ?AM-PAC PT "6 Clicks" Mobility   ?Outcome Measure ? Help needed turning from your back to your side while in a flat bed without using bedrails?: None ?Help needed moving from lying on your back to sitting on the side of a flat bed without using bedrails?: A Little ?Help needed moving to and from a bed to a chair (including a wheelchair)?: A Little ?Help needed standing up from a chair using your arms (e.g., wheelchair or bedside chair)?: A Little ?Help needed to walk in hospital room?: A Little ?Help needed climbing 3-5 steps with a railing? : A Little ?6 Click Score: 19 ? ?  ?End of Session Equipment Utilized During Treatment: Gait belt ?Activity Tolerance: Treatment limited secondary to medical complications (Comment) (HTN and pain) ?Patient left: in bed;with call bell/phone within reach;with family/visitor present ?Nurse Communication: Mobility status ?PT Visit Diagnosis: Muscle weakness (generalized) (M62.81);Difficulty in walking, not elsewhere classified (R26.2) ?  ? ? ?Time: 4268-3419 ?PT Time Calculation (min) (ACUTE ONLY): 23 min ? ?Charges:  $Gait Training: 8-22 mins ?$Therapeutic Exercise: 8-22  mins          ?          ? ?Renaldo Fiddler PT, DPT ?Acute Rehabilitation Services ?Office [phone] ?Pager [phone]  ? ? ?Kathryn Delgado ?09/02/2021, 11:27 AM ? ?

## 2021-09-02 NOTE — Plan of Care (Signed)
Discharge instructions given to the patient including medications. ?

## 2021-09-02 NOTE — Plan of Care (Signed)
  Problem: Activity: Goal: Risk for activity intolerance will decrease Outcome: Progressing   Problem: Pain Managment: Goal: General experience of comfort will improve Outcome: Progressing   Problem: Safety: Goal: Ability to remain free from injury will improve Outcome: Progressing   

## 2021-09-02 NOTE — Progress Notes (Signed)
Physical Therapy Treatment ?Patient Details ?Name: Kathryn Delgado ?MRN: [id number] ?DOB: 11-04-1965 ?Today's Date: 09/02/2021 ? ? ?History of Present Illness Patient is 56 y.o. female s/p Lt TKA on 09/01/21 with PMH significant for ETOH abuse, OA, HLD, HTN, liver lesion. ? ?  ?PT Comments  ? ? Patient progressing well and demonstrated excellent recall for safe technique with transfers and gait using RW. No LOB noted or buckling at Lt knee. Patient's significant other present and provided safe guarding with gait and stair mobility. Reviewed HEP at [facility] and addressed questions pt had for discharge. She is mobilizing at safe level for return home. Acute PT will continue to progress during her stay. ? ?  ?Recommendations for follow up therapy are one component of a multi-disciplinary discharge planning process, led by the attending physician.  Recommendations may be updated based on patient status, additional functional criteria and insurance authorization. ? ?Follow Up Recommendations ? Home health PT (pt has 15 steps to enter/exit home) ?  ?  ?Assistance Recommended at Discharge Frequent or constant Supervision/Assistance  ?Patient can return home with the following A little help with walking and/or transfers;A little help with bathing/dressing/bathroom;Assistance with cooking/housework;Assist for transportation;Help with stairs or ramp for entrance;Direct supervision/assist for medications management ?  ?Equipment Recommendations ? None recommended by PT  ?  ?Recommendations for Other Services   ? ? ?  ?Precautions / Restrictions Precautions ?Precautions: Fall ?Restrictions ?Weight Bearing Restrictions: No ?Other Position/Activity Restrictions: WBAT  ?  ? ?Mobility ? Bed Mobility ?Overal bed mobility: Needs Assistance ?Bed Mobility: Supine to Sit ?  ?  ?Supine to sit: HOB elevated, Min guard ?  ?  ?General bed mobility comments: supervision for sfaety, pt taking extra time ?  ? ?Transfers ?Overall transfer level: Needs  assistance ?Equipment used: Rolling walker (2 wheels) ?Transfers: Sit to/from Stand, Bed to chair/wheelchair/BSC ?Sit to Stand: Min guard ?  ?  ?  ?  ?  ?General transfer comment: guarding for safety with power up from EOB and toilet. pt demonstrated good recall for safe hand placement. No assist needed. cues to extend Lt knee with sitting. ?  ? ?Ambulation/Gait ?Ambulation/Gait assistance: Min guard, Supervision ?Gait Distance (Feet): 30 Feet ?Assistive device: Rolling walker (2 wheels) ?Gait Pattern/deviations: Step-to pattern, Decreased stride length, Decreased weight shift to left ?Gait velocity: decr ?  ?  ?General Gait Details: guard/supervision for safety. pt's spouse present and provided safe guarding throughout with supervision from therapist. no buckling or LOB noted. ? ? ?Stairs ?Stairs: Yes ?Stairs assistance: Min guard ?Stair Management: One rail Left, Step to pattern, Sideways ?Number of Stairs: 2 ?General stair comments: pt demonstrated good recall for step sequencing. pt's sig other provided safe gaurding with supervision and cues from therapist. no LOB throughout. ? ? ?Wheelchair Mobility ?  ? ?Modified Rankin (Stroke Patients Only) ?  ? ? ?  ?Balance Overall balance assessment: Needs assistance ?Sitting-balance support: Feet supported ?Sitting balance-Leahy Scale: Good ?  ?  ?Standing balance support: Reliant on assistive device for balance, During functional activity, Bilateral upper extremity supported ?Standing balance-Leahy Scale: Fair ?  ?  ?  ?  ?  ?  ?  ?  ?  ?  ?  ?  ?  ? ?  ?Cognition Arousal/Alertness: Awake/alert ?Behavior During Therapy: [place] for tasks assessed/performed ?Overall Cognitive Status: Within Functional Limits for tasks assessed ?  ?  ?  ?  ?  ?  ?  ?  ?  ?  ?  ?  ?  ?  ?  ?  ?  ?  ?  ? ?  ?  Exercises Total Joint Exercises ?Ankle Circles/Pumps: AROM, Both, 10 reps, Seated ?Quad Sets: AROM, Both, 5 reps, Seated ?Short Arc Quad: AROM, Both, Seated, Other reps (comment) (2) ?Heel  Slides: AROM, Left, 5 reps, Seated ?Hip ABduction/ADduction: AROM, Both, 5 reps, Seated ? ?  ?General Comments   ?  ?  ? ?Pertinent Vitals/Pain Pain Assessment ?Pain Assessment: 0-10 ?Pain Score: 3  ?Pain Location: Lt knee ?Pain Descriptors / Indicators: Aching, Discomfort, Guarding ?Pain Intervention(s): Limited activity within patient's tolerance, Monitored during session, Repositioned  ? ? ?Home Living   ?  ?  ?  ?  ?  ?  ?  ?  ?  ?   ?  ?Prior Function    ?  ?  ?   ? ?PT Goals (current goals can now be found in the care plan section) Acute Rehab PT Goals ?Patient Stated Goal: get home and control pain ?PT Goal Formulation: With patient ?Time For Goal Achievement: 09/08/21 ?Potential to Achieve Goals: Good ?Progress towards PT goals: Progressing toward goals ? ?  ?Frequency ? ? ? 7X/week ? ? ? ?  ?PT Plan Current plan remains appropriate  ? ? ?Co-evaluation   ?  ?  ?  ?  ? ?  ?AM-PAC PT "6 Clicks" Mobility   ?Outcome Measure ? Help needed turning from your back to your side while in a flat bed without using bedrails?: None ?Help needed moving from lying on your back to sitting on the side of a flat bed without using bedrails?: A Little ?Help needed moving to and from a bed to a chair (including a wheelchair)?: A Little ?Help needed standing up from a chair using your arms (e.g., wheelchair or bedside chair)?: A Little ?Help needed to walk in hospital room?: A Little ?Help needed climbing 3-5 steps with a railing? : A Little ?6 Click Score: 19 ? ?  ?End of Session Equipment Utilized During Treatment: Gait belt ?Activity Tolerance: Treatment limited secondary to medical complications (Comment) (HTN and pain) ?Patient left: in bed;with call bell/phone within reach;with family/visitor present ?Nurse Communication: Mobility status ?PT Visit Diagnosis: Muscle weakness (generalized) (M62.81);Difficulty in walking, not elsewhere classified (R26.2) ?  ? ? ?Time: 8185-6314 ?PT Time Calculation (min) (ACUTE ONLY): 20  min ? ?Charges:  $Gait Training: 8-22 mins          ?          ? ?Renaldo Fiddler PT, DPT ?Acute Rehabilitation Services ?Office [phone] ?Pager [phone]  ? ? ?Anitra Lauth ?09/02/2021, 5:04 PM ? ?

## 2021-09-02 NOTE — Discharge Summary (Signed)
Patient ID: ?Kathryn Delgado ?MRN: [id number] ?DOB/AGE: May 05, 1966 56 y.o. ? ?Admit date: 09/01/2021 ?Discharge date: 09/02/2021 ? ?Admission Diagnoses:  ?Principal Problem: ?  Primary osteoarthritis of left knee ?Active Problems: ?  S/P total knee arthroplasty, left ? ? ?Discharge Diagnoses:  ?Same ? ?Past Medical History:  ?Diagnosis Date  ? Alcohol use   ? Aortic atherosclerosis (Kathryn Delgado) 12/06/2019  ? Arthritis   ? self report  ? Gallstones 05/08/2018  ? Found on [place]  ? Gastroesophageal reflux disease 12/20/2018  ? Headache   ? Hyperlipidemia   ? Hypertension   ? Liver lesion 12/06/2019  ? ? ?Surgeries: Procedure(s): ?LEFT TOTAL KNEE ARTHROPLASTY on 09/01/2021 ?  ?Consultants: Treatment Team:  ?Pokhrel, Corrie Mckusick, MD ? ?Discharged Condition: Improved ? ?Hospital Course: Kathryn Delgado is an 56 y.o. female who was admitted 09/01/2021 for operative treatment ofPrimary osteoarthritis of left knee. Patient has severe unremitting pain that affects sleep, daily activities, and work/hobbies. After pre-op clearance the patient was taken to the operating room on 09/01/2021 and underwent  Procedure(s): ?LEFT TOTAL KNEE ARTHROPLASTY.   ? ?Patient was given perioperative antibiotics:  ?Anti-infectives (From admission, onward)  ? ? Start     Dose/Rate Route Frequency Ordered Stop  ? 09/01/21 1600  ceFAZolin (ANCEF) IVPB 2g/100 mL premix       ? 2 g ?200 mL/hr over 30 Minutes Intravenous Every 6 hours 09/01/21 1149 09/01/21 2211  ? 09/01/21 1513  ceFAZolin (ANCEF) 2-4 GM/100ML-% IVPB       ?Note to Pharmacy: Dorise Hiss M: cabinet override  ?    09/01/21 1513 09/02/21 0329  ? 09/01/21 0800  ceFAZolin (ANCEF) IVPB 2g/100 mL premix       ? 2 g ?200 mL/hr over 30 Minutes Intravenous On call to O.R. 09/01/21 [id number] 09/01/21 0954  ? ?  ?  ? ?Patient was given sequential compression devices, early ambulation, and chemoprophylaxis to prevent DVT. ? ?Patient benefited maximally from hospital stay and there were no complications.   ? ?Recent vital signs:  Patient Vitals for the past 24 hrs: ? BP Temp Temp src Pulse Resp SpO2  ?09/02/21 0938 137/87 98.3 ?F (36.8 ?C) Oral 92 17 100 %  ?09/02/21 0624 (!) 167/83 98.3 ?F (36.8 ?C) -- 77 17 100 %  ?09/02/21 0029 (!) 154/98 97.8 ?F (36.6 ?C) -- 79 18 99 %  ?09/01/21 2051 (!) 191/116 98.5 ?F (36.9 ?C) -- 81 17 100 %  ?09/01/21 1727 (!) 191/113 97.9 ?F (36.6 ?C) Oral 82 16 98 %  ?09/01/21 1700 (!) 177/104 97.8 ?F (36.6 ?C) -- -- -- 98 %  ?09/01/21 1600 (!) 163/103 -- -- 80 17 98 %  ?09/01/21 1500 (!) 161/101 -- -- 83 -- 97 %  ?09/01/21 1343 (!) 158/104 -- -- 81 -- 96 %  ?09/01/21 1330 (!) 157/95 97.7 ?F (36.5 ?C) -- 86 14 96 %  ?09/01/21 1325 (!) 161/103 -- -- 84 12 95 %  ?09/01/21 1320 (!) 150/112 -- -- 78 12 95 %  ?09/01/21 1315 (!) 163/113 -- -- 72 11 95 %  ?09/01/21 1309 (!) 181/103 -- -- -- -- --  ?09/01/21 1300 (!) 185/117 -- -- 78 13 95 %  ?09/01/21 1245 (!) 162/127 -- -- 80 12 93 %  ?09/01/21 1230 (!) 169/111 -- -- 78 12 94 %  ?09/01/21 1215 (!) 186/113 -- -- 79 10 93 %  ?  ? ?Recent laboratory studies:  ?Recent Labs  ?  09/01/21 ?2022  ?NA 134*  ?K 4.2  ?  CL 101  ?CO2 22  ?BUN 11  ?CREATININE 0.60  ?GLUCOSE 119*  ?CALCIUM 8.8*  ? ? ? ?Discharge Medications:   ?Allergies as of 09/02/2021   ?No Known Allergies ?  ? ?  ?Medication List  ?  ? ?TAKE these medications   ? ?aspirin EC 81 MG tablet ?Take 1 tablet (81 mg total) by mouth 2 (two) times daily after a meal. For 2 weeks then 1 x a day for 2 weeks for blood clot prevention. ?  ?enalapril 2.5 MG tablet ?Commonly known as: Vasotec ?Take 1 tablet (2.5 mg total) by mouth daily. ?  ?HYDROcodone-acetaminophen 5-325 MG tablet ?Commonly known as: NORCO/VICODIN ?Take 1-2 tablets by mouth every 6 (six) hours as needed for moderate pain or severe pain (post op pain). ?  ?tiZANidine 4 MG tablet ?Commonly known as: Zanaflex ?Take 1 tablet (4 mg total) by mouth every 6 (six) hours as needed for muscle spasms. ?  ? ?  ? ?  ?  ? ? ?  ?Durable Medical Equipment  ?(From admission,  onward)  ?  ? ? ?  ? ?  Start     Ordered  ? 09/01/21 1718  DME Walker rolling  Once       ?Question:  Patient needs a walker to treat with the following condition  Answer:  Primary osteoarthritis of left knee  ? 09/01/21 1717  ? 09/01/21 1718  DME 3 n 1  Once       ? 09/01/21 1717  ? 09/01/21 1718  DME Bedside commode  Once       ?Question:  Patient needs a bedside commode to treat with the following condition  Answer:  Primary osteoarthritis of left knee  ? 09/01/21 1717  ? ?  ?  ? ?  ? ? ?  ?Discharge Care Instructions  ?(From admission, onward)  ?  ? ? ?  ? ?  Start     Ordered  ? 09/02/21 0000  Weight bearing as tolerated       ? 09/02/21 1213  ? ?  ?  ? ?  ? ? ?Diagnostic Studies: DG Chest 2 View ? ?Result Date: 08/22/2021 ?CLINICAL DATA:  Pre op eval for 56 y.o. outpatient scheduled for knee replacement on 3/28. EXAM: CHEST - 2 VIEW COMPARISON:  None. FINDINGS: The heart and mediastinal contours are within normal limits. No focal consolidation. No pulmonary edema. No pleural effusion. No pneumothorax. No acute osseous abnormality. IMPRESSION: No active cardiopulmonary disease. Electronically Signed   By: Iven Finn M.D.   On: 08/22/2021 01:21   ? ?Disposition: Discharge disposition: 01-Home or Self Care ? ? ? ? ? ? ?Discharge Instructions   ? ? Call MD / Call 911   Complete by: As directed ?  ? If you experience chest pain or shortness of breath, CALL 911 and be transported to the hospital emergency room.  If you develope a fever above 101 F, pus (white drainage) or increased drainage or redness at the wound, or calf pain, call your surgeon's office.  ? Call MD / Call 911   Complete by: As directed ?  ? If you experience chest pain or shortness of breath, CALL 911 and be transported to the hospital emergency room.  If you develope a fever above 101 F, pus (white drainage) or increased drainage or redness at the wound, or calf pain, call your surgeon's office.  ? Constipation Prevention   Complete by: As  directed ?  ?  Drink plenty of fluids.  Prune juice may be helpful.  You may use a stool softener, such as Colace (over the counter) 100 mg twice a day.  Use MiraLax (over the counter) for constipation as needed.  ? Constipation Prevention   Complete by: As directed ?  ? Drink plenty of fluids.  Prune juice may be helpful.  You may use a stool softener, such as Colace (over the counter) 100 mg twice a day.  Use MiraLax (over the counter) for constipation as needed.  ? Diet - low sodium heart healthy   Complete by: As directed ?  ? Discharge instructions   Complete by: As directed ?  ? INSTRUCTIONS AFTER JOINT REPLACEMENT  ? ?Remove items at home which could result in a fall. This includes throw rugs or furniture in walking pathways ?ICE to the affected joint every three hours while awake for 30 minutes at a time, for at least the first 3-5 days, and then as needed for pain and swelling.  Continue to use ice for pain and swelling. You may notice swelling that will progress down to the foot and ankle.  This is normal after surgery.  Elevate your leg when you are not up walking on it.   ?Continue to use the breathing machine you got in the hospital (incentive spirometer) which will help keep your temperature down.  It is common for your temperature to cycle up and down following surgery, especially at night when you are not up moving around and exerting yourself.  The breathing machine keeps your lungs expanded and your temperature down. ? ? ?DIET:  As you were doing prior to hospitalization, we recommend a well-balanced diet. ? ?DRESSING / WOUND CARE / SHOWERING ? ?You may shower 3 days after surgery, but keep the wounds dry during showering.  You may use an occlusive plastic wrap (Press'n Seal for example), NO SOAKING/SUBMERGING IN THE BATHTUB.  If the bandage gets wet, change with a clean dry gauze.  If the incision gets wet, pat the wound dry with a clean towel. ? ?ACTIVITY ? ?Increase activity slowly as tolerated,  but follow the weight bearing instructions below.   ?No driving for 6 weeks or until further direction given by your physician.  You cannot drive while taking narcotics.  ?No lifting or carrying greater

## 2021-09-02 NOTE — Progress Notes (Signed)
Patient was seen and examined at bedside.  Patient was consulted for elevated blood pressure.  Postoperative status.  Could be secondary to increased sympathetic drive.  Patient has been started on her home regimen of enalapril.  Advised about following up with PCP as outpatient.  Asymptomatic otherwise.  Orthopedic has plans for discharge today.  Medically stable for disposition. ?

## 2021-09-02 NOTE — Progress Notes (Signed)
Subjective: [name] (LRB): ?LEFT TOTAL KNEE ARTHROPLASTY (Left) ? ?Activity level:  OOB ?Diet tolerance:  liquids ?Voiding:  well ?Patient reports pain as mild.   ? ?Objective: ?Vital signs in last 24 hours: ?Temp:  [97.7 ?F (36.5 ?C)-98.5 ?F (36.9 ?C)] 98.3 ?F (36.8 ?C) (03/29 2952) ?Pulse Rate:  [70-90] 77 (03/29 0624) ?Resp:  [10-20] 17 (03/29 8413) ?BP: (150-191)/(83-127) 167/83 (03/29 2440) ?SpO2:  [92 %-100 %] 100 % (03/29 0624) ?Weight:  [115.3 kg] 115.3 kg (03/28 0757) ? ?Labs: ?No results for input(s): HGB in the last 72 hours. ?No results for input(s): WBC, RBC, HCT, PLT in the last 72 hours. ?Recent Labs  ?  09/01/21 ?2022  ?NA 134*  ?K 4.2  ?CL 101  ?CO2 22  ?BUN 11  ?CREATININE 0.60  ?GLUCOSE 119*  ?CALCIUM 8.8*  ? ?No results for input(s): LABPT, INR in the last 72 hours. ? ?Physical Exam: ? Neurologically intact ?ABD soft ?Sensation intact distally ?Intact pulses distally ?Compartment soft ? ?Assessment/Plan: ? [name] (LRB): ?LEFT TOTAL KNEE ARTHROPLASTY (Left) ?Advance diet ?Up with therapy ?Hopefully home later today if BP comes down a bit on her enalapril.   ?Appreciate medical team hep with BP mgmt ? ? ? ?Kathryn Delgado Kathryn Delgado ?09/02/2021, 7:44 AM  ?

## 2021-09-02 NOTE — Discharge Instructions (Signed)

## 2021-09-02 NOTE — Plan of Care (Cosign Needed)
Completed during shift assessment ?

## 2021-09-02 NOTE — TOC Transition Note (Signed)
Transition of Care (TOC) - CM/SW Discharge Note ? ? ?Patient Details  ?Name: Kathryn Delgado ?MRN: [id number] ?Date of Birth: 02/01/1966 ? ?Transition of Care (TOC) CM/SW Contact:  ?Abdi Husak, LCSW ?Phone Number: ?09/02/2021, 10:46 AM ? ? ?Clinical Narrative:    ?Met with pt today to review dc needs.  Pt confirms that she has all needed DME at home.  She is aware and agreeable to recommendation/ orders for HHPT and no agency preference. Able to secure HHPT via [facility] and info placed on AVS.  No further TOC needs. ? ?Final next level of care: [place] ?Barriers to Discharge: No Barriers Identified ? ? ?Patient Goals and CMS Choice ?Patient states their goals for this hospitalization and ongoing recovery are:: return home ?  ?  ? ?Discharge Placement ?  ?           ?  ?  ?  ?  ? ?Discharge Plan and Services ?  ?  ?           ?DME Arranged: N/A ?DME Agency: NA ?  ?  ?  ?HH Arranged: PT ?[place] Agency: [facility] ?Date HH Agency Contacted: 09/02/21 ?Time [place]: [id number] ?Representative spoke with at [place]: Tommi Rumps ? ?Social Determinants of Health (SDOH) Interventions ?  ? ? ?Readmission Risk Interventions ? ?  09/02/2021  ? 10:45 AM  ?Readmission Risk Prevention Plan  ?Post Dischage Appt Complete  ?Medication Screening Complete  ?Transportation Screening Complete  ? ? ? ? ? ?

## 2021-09-03 ENCOUNTER — Telehealth: Payer: Self-pay

## 2021-09-03 NOTE — Telephone Encounter (Signed)
Received call from pt this morning stating that she wants to cancel HHPT that was arranged with [facility] yesterday.  She prefers to do OPPT at Dr. Nolon Nations office and already has an appointment set up.  Have cancelled HHPT referral with [facility] Keen) this morning.   ?

## 2021-11-20 ENCOUNTER — Ambulatory Visit: Payer: Managed Care, Other (non HMO) | Admitting: Medical

## 2021-11-20 VITALS — BP 120/80 | HR 106 | Temp 97.6°F | Wt 244.2 lb

## 2021-11-20 DIAGNOSIS — H00019 Hordeolum externum unspecified eye, unspecified eyelid: Secondary | ICD-10-CM

## 2021-11-20 MED ORDER — ERYTHROMYCIN 5 MG/GM OP OINT: 1.0000 | TOPICAL_OINTMENT | Freq: Every day | OPHTHALMIC | 1 refills | Status: DC

## 2021-11-20 NOTE — Progress Notes (Signed)
Subjective:  Kathryn Delgado is a 56 y.o. female who presents for Chief Complaint  Patient presents with   Stye    Stye x 2 months on left eye. Itches and some soreness. It will go down and then come back up      Here for stye left eye x 2 months.   Has gotten a little better at times, then worse again.  Not going away completely.  Has used OTC stye ointment.  Last eye doctor visit a year ago.   No other eye issue, no drainage, no redness, no URI symptoms.  No recent foreign body in eye.  No other aggravating or relieving factors.    No other c/o.  The following portions of the patient's history were reviewed and updated as appropriate: allergies, current medications, past family history, past medical history, past social history, past surgical history and problem list.  ROS Otherwise as in subjective above  Objective: BP 120/80   Pulse (!) 106   Temp 97.6 F (36.4 C)   Wt 244 lb 3.2 oz (110.8 kg)   BMI 38.25 kg/m   General appearance: alert, no distress, well developed, well nourished HEENT: normocephalic, sclerae anicteric, conjunctiva pink and moist, left upper eye lid medially with swollen raised lesion c/w stye and similar much smaller lesion of right upper eyelid   Assessment: Encounter Diagnosis  Name Primary?   Hordeolum externum, unspecified laterality Yes     Plan: Discussed handwashing and good hygiene, begin warm compress BID, ointment as below.   If not resolved within 10 days, will need to see eye doctor  Kathryn Delgado was seen today for stye.  Diagnoses and all orders for this visit:  Hordeolum externum, unspecified laterality  Other orders -     erythromycin ophthalmic ointment; Place 1 Application into both eyes at bedtime.    Follow up: prn

## 2021-12-04 ENCOUNTER — Encounter: Payer: Self-pay | Admitting: Internal Medicine

## 2022-01-04 ENCOUNTER — Other Ambulatory Visit: Payer: Self-pay | Admitting: Family Medicine

## 2022-01-15 ENCOUNTER — Ambulatory Visit: Payer: Managed Care, Other (non HMO) | Admitting: Family Medicine

## 2022-01-15 VITALS — BP 158/98 | HR 80 | Temp 97.3°F | Ht 67.0 in | Wt 244.6 lb

## 2022-01-15 DIAGNOSIS — Z789 Other specified health status: Secondary | ICD-10-CM | POA: Diagnosis not present

## 2022-01-15 DIAGNOSIS — Z87891 Personal history of nicotine dependence: Secondary | ICD-10-CM

## 2022-01-15 DIAGNOSIS — Z96652 Presence of left artificial knee joint: Secondary | ICD-10-CM | POA: Diagnosis not present

## 2022-01-15 DIAGNOSIS — Z01818 Encounter for other preprocedural examination: Secondary | ICD-10-CM

## 2022-01-15 NOTE — Progress Notes (Signed)
   Subjective:    Patient ID: Kathryn Delgado, female    DOB: 1965-08-12, 56 y.o.   MRN: [id number]  HPI She is here for preoperative evaluation prior to right TKA.  She has had the left 1 fixed and she is not ready to have the right knee taken care of.  She has no underlying cardiac or lung related issues.  Continues on her blood pressure medication.  She is actually stopped smoking and is actually cut back on her alcohol consumption.   Review of Systems     Objective:   Physical Exam Alert and in no distress. Tympanic membranes and canals are normal. Pharyngeal area is normal. Neck is supple without adenopathy or thyromegaly. Cardiac exam shows a regular sinus rhythm without murmurs or gallops. Lungs are clear to auscultation.        Assessment & Plan:  Preoperative evaluation of a medical condition to rule out surgical contraindications (TAR required)  Former smoker  Alcohol consumption heavy  S/P total knee arthroplasty, left I congratulated her on her quitting smoking and cutting back on her alcohol consumption.  Reinforced the need for her to work hard on her rehab postoperatively.

## 2022-01-21 ENCOUNTER — Other Ambulatory Visit: Payer: Self-pay | Admitting: Orthopaedic Surgery

## 2022-01-29 ENCOUNTER — Ambulatory Visit: Payer: Managed Care, Other (non HMO) | Admitting: Family Medicine

## 2022-02-02 NOTE — Progress Notes (Signed)
Sent message, via epic in basket, requesting order in epic from surgeon     02/02/22 1544  Preop Orders  Has preop orders? No  Name of staff/physician contacted for orders(Indicate phone or IB message) R. Lamar Sprinkles

## 2022-02-07 NOTE — Progress Notes (Signed)
COVID Vaccine received:  []  No [x]  Yes Date of any COVID positive Test in last 90 days:  PCP - B. Cardiologist - n/a  Chest x-ray - 08-19-21   Epic EKG -  08-19-21  Epic Stress Test - n/a ECHO - n/a Cardiac Cath - n/a  Pacemaker/ICD device     []  N/A Spinal Cord Stimulator:[]  No []  Yes      (Remind patient to bring remote DOS) Other Implants:   Bowel Prep -   History of Sleep Apnea? []  No []  Yes   Sleep Study Date:   CPAP used?- []  No []  Yes  (Instruct to bring their mask & Tubing)  Does the patient monitor blood sugar? []  No []  Yes  []  N/A Does patient have a 08-21-21 or Dexacom? []  No []  Yes   Fasting Blood Sugar Ranges-  Checks Blood Sugar _____ times a day  Blood Thinner Instructions: Aspirin Instructions: Last Dose:  ERAS Protocol Ordered: []  No  []  Yes PRE-SURGERY []  ENSURE  []  G2   Comments:   Activity level: Patient can / can not climb a flight of stairs without difficulty;  []  No CP  []  No SOB,  but would have ______   Anesthesia review:   Patient denies shortness of breath, fever, cough and chest pain at PAT appointment.  Patient verbalized understanding and agreement to the Pre-Surgical Instructions that were given to them at this PAT appointment. Patient was also educated of the need to review these PAT instructions again prior to his/her surgery.I reviewed the appropriate phone numbers to call if they have any and questions or concerns.

## 2022-02-09 ENCOUNTER — Other Ambulatory Visit: Payer: Self-pay | Admitting: Orthopaedic Surgery

## 2022-02-10 ENCOUNTER — Encounter: Payer: Self-pay | Admitting: Internal Medicine

## 2022-02-10 ENCOUNTER — Encounter (HOSPITAL_COMMUNITY)
Admission: RE | Admit: 2022-02-10 | Discharge: 2022-02-10 | Disposition: A | Discharge status: Home / Self Care | Payer: Managed Care, Other (non HMO) | Source: Ambulatory Visit | Attending: Orthopaedic Surgery | Admitting: Orthopaedic Surgery

## 2022-02-10 ENCOUNTER — Ambulatory Visit (HOSPITAL_COMMUNITY)
Admission: RE | Admit: 2022-02-10 | Discharge: 2022-02-10 | Disposition: A | Discharge status: Home / Self Care | Payer: Managed Care, Other (non HMO) | Source: Ambulatory Visit | Attending: Orthopaedic Surgery | Admitting: Orthopaedic Surgery

## 2022-02-10 ENCOUNTER — Other Ambulatory Visit: Payer: Self-pay

## 2022-02-10 ENCOUNTER — Encounter (HOSPITAL_COMMUNITY): Payer: Self-pay

## 2022-02-10 VITALS — BP 190/105 | HR 92 | Temp 98.7°F | Resp 20 | Ht 67.0 in | Wt 232.0 lb

## 2022-02-10 DIAGNOSIS — I10 Essential (primary) hypertension: Secondary | ICD-10-CM | POA: Diagnosis not present

## 2022-02-10 DIAGNOSIS — Z01818 Encounter for other preprocedural examination: Secondary | ICD-10-CM | POA: Diagnosis not present

## 2022-02-10 LAB — CBC
HCT: 41.3 % (ref 36.0–46.0)
Hemoglobin: 13.2 g/dL (ref 12.0–15.0)
MCH: 28.3 pg (ref 26.0–34.0)
MCHC: 32 g/dL (ref 30.0–36.0)
MCV: 88.6 fL (ref 80.0–100.0)
Platelets: 258 10*3/uL (ref 150–400)
RBC: 4.66 MIL/uL (ref 3.87–5.11)
RDW: 14.6 % (ref 11.5–15.5)
WBC: 6.1 10*3/uL (ref 4.0–10.5)
nRBC: 0 % (ref 0.0–0.2)

## 2022-02-10 LAB — BASIC METABOLIC PANEL
Anion gap: 6 (ref 5–15)
BUN: 12 mg/dL (ref 6–20)
CO2: 19 mmol/L — ABNORMAL LOW (ref 22–32)
Calcium: 9 mg/dL (ref 8.9–10.3)
Chloride: 114 mmol/L — ABNORMAL HIGH (ref 98–111)
Creatinine, Ser: 0.52 mg/dL (ref 0.44–1.00)
GFR, Estimated: >60 mL/min (ref >60)
Glucose, Bld: 117 mg/dL — ABNORMAL HIGH (ref 70–99)
Potassium: 3.5 mmol/L (ref 3.5–5.1)
Sodium: 139 mmol/L (ref 135–145)

## 2022-02-10 LAB — SURGICAL PCR SCREEN
MRSA, PCR: NEGATIVE
Staphylococcus aureus: NEGATIVE

## 2022-02-10 NOTE — Patient Instructions (Addendum)
DUE TO SPACE LIMITATIONS, ONLY TWO VISITORS  (aged 57 and older) ARE ALLOWED TO COME WITH YOU AND STAY IN THE WAITING ROOM DURING YOUR PRE OP AND PROCEDURE.   **NO VISITORS ARE ALLOWED IN THE SHORT STAY AREA OR RECOVERY ROOM!!**  I You are not required to quarantine at this time prior to your surgery. However, you must do this: Hand Hygiene often Do NOT share personal items Notify your provider if you are in close contact with someone who has COVID or you develop fever 100.4 or greater, new onset of sneezing, cough, sore throat, shortness of breath or body aches.       Your procedure is scheduled on:  Tuesday  February 23, 2022  Report to [facility] Main Entrance.  Report to admitting at:  09:15   AM  +++++Call this number if you have any questions or problems the morning of surgery [phone]  Do not eat food :After Midnight the night prior to your surgery/procedure.  After Midnight you may have the following liquids until  08:45 AM DAY OF SURGERY  Clear Liquid Diet Water Black Coffee (sugar ok, NO MILK/CREAM OR CREAMERS)  Tea (sugar ok, NO MILK/CREAM OR CREAMERS) regular and decaf                             Plain Jell-O (NO RED)                                           Fruit ices (not with fruit pulp, NO RED)                                     Popsicles (NO RED)                                                                  Juice: apple, WHITE grape, WHITE cranberry Sports drinks like Gatorade (NO RED)                   The day of surgery:  Drink ONE (1) Pre-Surgery Clear Ensure at   08:45   AM the morning of surgery. Drink in one sitting. Do not sip.  This drink was given to you during your hospital pre-op appointment visit. Nothing else to drink after completing the Pre-Surgery Clear Ensure   FOLLOW ANY ADDITIONAL PRE OP INSTRUCTIONS YOU RECEIVED FROM YOUR SURGEON'S OFFICE!!!   Oral Hygiene is also important to reduce your risk of infection.         Remember - BRUSH YOUR TEETH THE MORNING OF SURGERY WITH YOUR REGULAR TOOTHPASTE  Do NOT smoke after Midnight the night before surgery.  Take ONLY these medicines the morning of surgery with A SIP OF WATER:  no medications                    You may not have any metal on your body including hair pins, jewelry, and body piercing  Do not wear make-up, lotions, powders, perfumes, or deodorant  Do  not wear nail polish including gel and S&S, artificial / acrylic nails, or any other type of covering on natural nails including finger and toenails. If you have artificial nails, gel coating, etc., that needs to be removed by a nail salon, Please have this removed prior to surgery. Not doing so may mean that your surgery could be cancelled or delayed if the Surgeon or anesthesia staff feels like they are unable to monitor you safely.   Do not shave 48 hours prior to surgery to avoid nicks in your skin which may contribute to postoperative infections.    DO NOT BRING YOUR HOME MEDICATIONS TO THE HOSPITAL. PHARMACY WILL DISPENSE MEDICATIONS LISTED ON YOUR MEDICATION LIST TO YOU DURING YOUR ADMISSION IN THE HOSPITAL!   Patients discharged on the day of surgery will not be allowed to drive home.  Someone NEEDS to stay with you for the first 24 hours after anesthesia.  Please read over the following fact sheets you were given: IF YOU HAVE QUESTIONS ABOUT YOUR PRE-OP INSTRUCTIONS, PLEASE CALL [phone]  (KAY)   [place] - Preparing for Surgery Before surgery, you can play an important role.  Because skin is not sterile, your skin needs to be as free of germs as possible.  You can reduce the number of germs on your skin by washing with CHG (chlorahexidine gluconate) soap before surgery.  CHG is an antiseptic cleaner which kills germs and bonds with the skin to continue killing germs even after washing. Please DO NOT use if you have an allergy to CHG or antibacterial soaps.  If your skin becomes  reddened/irritated stop using the CHG and inform your nurse when you arrive at Short Stay. Do not shave (including legs and underarms) for at least 48 hours prior to the first CHG shower.  You may shave your face/neck.  Please follow these instructions carefully:  1.  Shower with CHG Soap the night before surgery and the  morning of surgery.  2.  If you choose to wash your hair, wash your hair first as usual with your normal  shampoo.  3.  After you shampoo, rinse your hair and body thoroughly to remove the shampoo.                             4.  Use CHG as you would any other liquid soap.  You can apply chg directly to the skin and wash.  Gently with a scrungie or clean washcloth.  5.  Apply the CHG Soap to your body ONLY FROM THE NECK DOWN.   Do not use on face/ open                           Wound or open sores. Avoid contact with eyes, ears mouth and genitals (private parts).                       Wash face,  Genitals (private parts) with your normal soap.             6.  Wash thoroughly, paying special attention to the area where your  surgery  will be performed.  7.  Thoroughly rinse your body with warm water from the neck down.  8.  DO NOT shower/wash with your normal soap after using and rinsing off the CHG Soap.  9.  Pat yourself dry with a clean towel.            10.  Wear clean pajamas.            11.  Place clean sheets on your bed the night of your first shower and do not  sleep with pets.  ON THE DAY OF SURGERY : Do not apply any lotions/deodorants the morning of surgery.  Please wear clean clothes to the hospital/surgery center.    FAILURE TO FOLLOW THESE INSTRUCTIONS MAY RESULT IN THE CANCELLATION OF YOUR SURGERY  PATIENT SIGNATURE_________________________________  NURSE SIGNATURE__________________________________  ________________________________________________________________________        Rogelia Mire    An incentive spirometer is a tool  that can help keep your lungs clear and active. This tool measures how well you are filling your lungs with each breath. Taking long deep breaths may help reverse or decrease the chance of developing breathing (pulmonary) problems (especially infection) following: A long period of time when you are unable to move or be active. BEFORE THE PROCEDURE  If the spirometer includes an indicator to show your best effort, your nurse or respiratory therapist will set it to a desired goal. If possible, sit up straight or lean slightly forward. Try not to slouch. Hold the incentive spirometer in an upright position. INSTRUCTIONS FOR USE  Sit on the edge of your bed if possible, or sit up as far as you can in bed or on a chair. Hold the incentive spirometer in an upright position. Breathe out normally. Place the mouthpiece in your mouth and seal your lips tightly around it. Breathe in slowly and as deeply as possible, raising the piston or the ball toward the top of the column. Hold your breath for 3-5 seconds or for as long as possible. Allow the piston or ball to fall to the bottom of the column. Remove the mouthpiece from your mouth and breathe out normally. Rest for a few seconds and repeat Steps 1 through 7 at least 10 times every 1-2 hours when you are awake. Take your time and take a few normal breaths between deep breaths. The spirometer may include an indicator to show your best effort. Use the indicator as a goal to work toward during each repetition. After each set of 10 deep breaths, practice coughing to be sure your lungs are clear. If you have an incision (the cut made at the time of surgery), support your incision when coughing by placing a pillow or rolled up towels firmly against it. Once you are able to get out of bed, walk around indoors and cough well. You may stop using the incentive spirometer when instructed by your caregiver.  RISKS AND COMPLICATIONS Take your time so you do not get  dizzy or light-headed. If you are in pain, you may need to take or ask for pain medication before doing incentive spirometry. It is harder to take a deep breath if you are having pain. AFTER USE Rest and breathe slowly and easily. It can be helpful to keep track of a log of your progress. Your caregiver can provide you with a simple table to help with this. If you are using the spirometer at home, follow these instructions: SEEK MEDICAL CARE IF:  You are having difficultly using the spirometer. You have trouble using the spirometer as often as instructed. Your pain medication is not giving enough relief while using the spirometer. You develop fever of 100.5 F (38.1 C) or higher.  SEEK IMMEDIATE MEDICAL CARE IF:  You cough up bloody sputum that had not been present before. You develop fever of 102 F (38.9 C) or greater. You develop worsening pain at or near the incision site. MAKE SURE YOU:  Understand these instructions. Will watch your condition. Will get help right away if you are not doing well or get worse. Document Released: 10/04/2006 Document Revised: 08/16/2011 Document Reviewed: 12/05/2006 [facility] Patient Information 2014 [place], [place].

## 2022-02-12 ENCOUNTER — Ambulatory Visit (INDEPENDENT_AMBULATORY_CARE_PROVIDER_SITE_OTHER): Payer: Managed Care, Other (non HMO) | Admitting: Medical

## 2022-02-12 VITALS — BP 146/90 | HR 87 | Wt 238.8 lb

## 2022-02-12 DIAGNOSIS — I10 Essential (primary) hypertension: Secondary | ICD-10-CM | POA: Diagnosis not present

## 2022-02-12 DIAGNOSIS — R7301 Impaired fasting glucose: Secondary | ICD-10-CM | POA: Diagnosis not present

## 2022-02-12 MED ORDER — ENALAPRIL-HYDROCHLOROTHIAZIDE 10-25 MG PO TABS: 1.0000 | ORAL_TABLET | Freq: Every day | ORAL | 2 refills | Status: DC

## 2022-02-12 NOTE — Progress Notes (Signed)
Subjective:  Kathryn Delgado is a 56 y.o. female who presents for Chief Complaint  Patient presents with   discuss bp    Discuss bp. It has been running high, declines flu shot     Here for recheck on blood pressure.  Currently on enalapril.  Blood pressure still running high.  No symptoms of concern.  No chest pain, edema, shortness of breath or palpitations.  She tries to avoid salt.  She does drink some alcohol, shot here and there.  She is having knee surgery on the right later this month.  No other aggravating or relieving factors.    No other c/o.  Past Medical History:  Diagnosis Date   Alcohol use    Aortic atherosclerosis (HCC) 12/06/2019   Arthritis    self report   Gallstones 05/08/2018   Found on [place]   Gastroesophageal reflux disease 12/20/2018   Headache    Hyperlipidemia    Hypertension    Liver lesion 12/06/2019   Current Outpatient Medications on File Prior to Visit  Medication Sig Dispense Refill   aspirin EC 81 MG tablet Take 1 tablet (81 mg total) by mouth 2 (two) times daily after a meal. For 2 weeks then 1 x a day for 2 weeks for blood clot prevention. 45 tablet 0   enalapril (VASOTEC) 2.5 MG tablet Take 1 tablet (2.5 mg total) by mouth daily. 90 tablet 2   ibuprofen (ADVIL) 800 MG tablet Take 800 mg by mouth every 8 (eight) hours as needed for moderate pain.     tiZANidine (ZANAFLEX) 4 MG tablet Take 1 tablet (4 mg total) by mouth every 6 (six) hours as needed for muscle spasms. 40 tablet 1   No current facility-administered medications on file prior to visit.     The following portions of the patient's history were reviewed and updated as appropriate: allergies, current medications, past family history, past medical history, past social history, past surgical history and problem list.  ROS Otherwise as in subjective above    Objective: BP (!) 146/90   Pulse 87   Wt 238 lb 12.8 oz (108.3 kg)   BMI 37.40 kg/m   Wt Readings from Last 3 Encounters:   02/12/22 238 lb 12.8 oz (108.3 kg)  02/10/22 232 lb (105.2 kg)  01/15/22 244 lb 9.6 oz (110.9 kg)   BP Readings from Last 3 Encounters:  02/12/22 (!) 146/90  02/10/22 (!) 190/105  01/15/22 (!) 158/98   General appearance: alert, no distress, well developed, well nourished Neck: supple, no lymphadenopathy, no thyromegaly, no masses Heart: RRR, normal S1, S2, no murmurs Lungs: CTA bilaterally, no wheezes, rhonchi, or rales Pulses: 2+ radial pulses, 2+ pedal pulses, normal cap refill Ext: no edema   Assessment: Encounter Diagnoses  Name Primary?   Essential hypertension Yes   Elevated fasting blood sugar      Plan: Looking back in chart record she was on lisinopril HCT at one-point and did not tolerate this as well.  The chart shows a prior losartan HCT but she does not recall taking this medication.  She is tolerating enalapril so far.  We will increase to dose below enalapril HCT.  We discussed risk and benefits and proper use of medication.  We discussed that some medicines work better African-Americans than others.  I wished her luck with the surgery.  Follow-up in 4 to 8 weeks  Kathryn Delgado was seen today for discuss bp.  Diagnoses and all orders for this visit:  Essential  hypertension  Elevated fasting blood sugar  Other orders -     enalapril-hydrochlorothiazide (VASERETIC) 10-25 MG tablet; Take 1 tablet by mouth daily.    Follow up: 4-8 wk

## 2022-02-17 NOTE — H&P (Signed)
TOTAL KNEE ADMISSION H&P  Patient is being admitted for right total knee arthroplasty.  Subjective:  Chief Complaint:right knee pain.  HPI: Kathryn Delgado, 56 y.o. female, has a history of pain and functional disability in the right knee due to arthritis and has failed non-surgical conservative treatments for greater than 12 weeks to includeNSAID's and/or analgesics, corticosteriod injections, flexibility and strengthening excercises, supervised PT with diminished ADL's post treatment, use of assistive devices, weight reduction as appropriate, and activity modification.  Onset of symptoms was gradual, starting 5 years ago with gradually worsening course since that time. The patient noted no past surgery on the right knee(s).  Patient currently rates pain in the right knee(s) at 10 out of 10 with activity. Patient has night pain, worsening of pain with activity and weight bearing, pain that interferes with activities of daily living, crepitus, and joint swelling.  Patient has evidence of subchondral cysts, subchondral sclerosis, periarticular osteophytes, and joint space narrowing by imaging studies. There is no active infection.  Patient Active Problem List   Diagnosis Date Noted   Primary osteoarthritis of left knee 09/01/2021   S/P total knee arthroplasty, left 09/01/2021   Elevated fasting blood sugar 08/20/2021   Mixed hyperlipidemia 08/18/2021   GERD without esophagitis 08/18/2021   Family history of diabetes mellitus (DM) 08/18/2021   Body mass index (BMI) of 39.0 to 39.9 in adult 08/18/2021   Acute pain of both shoulders 12/16/2020   Shoulder weakness 12/16/2020   Pain in both wrists 12/16/2020   Swelling of left wrist 12/16/2020   Pain in finger of both hands 12/16/2020   Chronic knee pain after total replacement of left knee joint 07/01/2020   10 year risk of MI or stroke < 7.5% 12/08/2019   Liver lesion 12/06/2019   Aortic atherosclerosis (HCC) 12/06/2019   Alcohol consumption  heavy 12/20/2018   Gallstones 05/08/2018   Essential hypertension 04/25/2018   Chronic pain of left knee 04/25/2018   Elevated ALT measurement 04/25/2018   Former smoker 04/25/2018   Past Medical History:  Diagnosis Date   Alcohol use    Aortic atherosclerosis (HCC) 12/06/2019   Arthritis    self report   Gallstones 05/08/2018   Found on [place]   Gastroesophageal reflux disease 12/20/2018   Headache    Hyperlipidemia    Hypertension    Liver lesion 12/06/2019    Past Surgical History:  Procedure Laterality Date   CHOLECYSTECTOMY     FOOT SURGERY  2005   FOOT SURGERY Left 2009   KNEE ARTHROCENTESIS  08/2018   TOTAL KNEE ARTHROPLASTY Left 09/01/2021   Procedure: LEFT TOTAL KNEE ARTHROPLASTY;  Surgeon: Marcene Corning, MD;  Location: [facility];  Service: Orthopedics;  Laterality: Left;   WISDOM TOOTH EXTRACTION      No current facility-administered medications for this encounter.   Current Outpatient Medications  Medication Sig Dispense Refill Last Dose   enalapril (VASOTEC) 2.5 MG tablet Take 1 tablet (2.5 mg total) by mouth daily. 90 tablet 2    ibuprofen (ADVIL) 800 MG tablet Take 800 mg by mouth every 8 (eight) hours as needed for moderate pain.      aspirin EC 81 MG tablet Take 1 tablet (81 mg total) by mouth 2 (two) times daily after a meal. For 2 weeks then 1 x a day for 2 weeks for blood clot prevention. 45 tablet 0 Not Taking   enalapril-hydrochlorothiazide (VASERETIC) 10-25 MG tablet Take 1 tablet by mouth daily. 30 tablet 2  tiZANidine (ZANAFLEX) 4 MG tablet Take 1 tablet (4 mg total) by mouth every 6 (six) hours as needed for muscle spasms. 40 tablet 1 Not Taking   No Known Allergies  Social History   Tobacco Use   Smoking status: Former    Packs/day: 0.50    Years: 40.00    Total pack years: 20.00    Types: Cigarettes    Quit date: 12/05/2021    Years since quitting: 0.2   Smokeless tobacco: Never  Substance Use Topics   Alcohol use: Yes    Alcohol/week: 2.0  standard drinks of alcohol    Types: 2 Cans of beer per week    Comment: socially    Family History  Problem Relation Age of Onset   Diabetes Mother    Thyroid disease Sister    Diabetes Sister    Thyroid disease Sister    Diabetes Brother    Colon polyps Neg Hx    Colon cancer Neg Hx    Esophageal cancer Neg Hx    Rectal cancer Neg Hx    Stomach cancer Neg Hx      Review of Systems  Musculoskeletal:  Positive for arthralgias.       Right knee  All other systems reviewed and are negative.   Objective:  Physical Exam Constitutional:      Appearance: Normal appearance.  HENT:     Head: Normocephalic and atraumatic.     Nose: Nose normal.     Mouth/Throat:     Pharynx: Oropharynx is clear.  Eyes:     Extraocular Movements: Extraocular movements intact.  Cardiovascular:     Rate and Rhythm: Normal rate and regular rhythm.  Pulmonary:     Effort: Pulmonary effort is normal.  Abdominal:     Palpations: Abdomen is soft.  Musculoskeletal:     Cervical back: Normal range of motion.     Comments: Examination of the right knee shows range of motion from 0-120 of flexion.  She has 1+ effusion.  Calf is soft and nontender.  Opposite knee has a well-healed scar with motion from just shy of full extension to 110 of flexion.  She is neurovascularly intact distally.    Skin:    General: Skin is warm and dry.  Neurological:     General: No focal deficit present.     Mental Status: She is alert and oriented to person, place, and time. Mental status is at baseline.  Psychiatric:        Mood and Affect: Mood normal.        Behavior: Behavior normal.        Thought Content: Thought content normal.     Vital signs in last 24 hours:    Labs:   Estimated body mass index is 37.4 kg/m as calculated from the following:   Height as of 02/10/22: 5\' 7"  (1.702 m).   Weight as of 02/12/22: 108.3 kg.   Imaging Review Plain radiographs demonstrate severe degenerative joint disease of  the right knee(s). The overall alignment isneutral. The bone quality appears to be good for age and reported activity level.   Assessment/Plan:  End stage primary arthritis, right knee   The patient history, physical examination, clinical judgment of the provider and imaging studies are consistent with end stage degenerative joint disease of the right knee(s) and total knee arthroplasty is deemed medically necessary. The treatment options including medical management, injection therapy arthroscopy and arthroplasty were discussed at length. The risks and  benefits of total knee arthroplasty were presented and reviewed. The risks due to aseptic loosening, infection, stiffness, patella tracking problems, thromboembolic complications and other imponderables were discussed. The patient acknowledged the explanation, agreed to proceed with the plan and consent was signed. Patient is being admitted for inpatient treatment for surgery, pain control, PT, OT, prophylactic antibiotics, VTE prophylaxis, progressive ambulation and ADL's and discharge planning. The patient is planning to be discharged home with home health services  Patient's anticipated LOS is less than 2 midnights, meeting these requirements: - Younger than 50 - Lives within 1 hour of care - Has a competent adult at home to recover with post-op recover - NO history of  - Chronic pain requiring opiods  - Diabetes  - Coronary Artery Disease  - Heart failure  - Heart attack  - Stroke  - DVT/VTE  - Cardiac arrhythmia  - Respiratory Failure/COPD  - Renal failure  - Anemia  - Advanced Liver disease

## 2022-02-22 ENCOUNTER — Telehealth: Payer: Self-pay | Admitting: Medical

## 2022-02-22 MED ORDER — TRANEXAMIC ACID 1000 MG/10ML IV SOLN
2000.0000 mg | INTRAVENOUS | Status: AC
  Filled 2022-02-22: qty 20

## 2022-02-22 NOTE — Telephone Encounter (Signed)
Pt was notified of results

## 2022-02-22 NOTE — Telephone Encounter (Signed)
Pt called and states that the new BP medication is making her dizzy. She said she took half and it didn't make her dizzy but when she takes full strength it does. Please advise pt at [phone]. Pt uses [facility] on [address].

## 2022-02-23 ENCOUNTER — Other Ambulatory Visit: Payer: Self-pay

## 2022-02-23 ENCOUNTER — Encounter (HOSPITAL_COMMUNITY)
Admission: RE | Disposition: A | Discharge status: Home / Self Care | Payer: Self-pay | Source: Home / Self Care | Attending: Orthopaedic Surgery

## 2022-02-23 ENCOUNTER — Observation Stay (HOSPITAL_COMMUNITY)
Admission: RE | Admit: 2022-02-23 | Discharge: 2022-02-24 | Disposition: A | Discharge status: Home / Self Care | Payer: Managed Care, Other (non HMO) | Attending: Orthopaedic Surgery | Admitting: Orthopaedic Surgery

## 2022-02-23 ENCOUNTER — Ambulatory Visit (HOSPITAL_COMMUNITY): Payer: Managed Care, Other (non HMO) | Admitting: Certified Registered"

## 2022-02-23 ENCOUNTER — Ambulatory Visit (HOSPITAL_COMMUNITY): Payer: Managed Care, Other (non HMO) | Admitting: Physician Assistant

## 2022-02-23 ENCOUNTER — Encounter (HOSPITAL_COMMUNITY): Payer: Self-pay | Admitting: Orthopaedic Surgery

## 2022-02-23 ENCOUNTER — Encounter: Payer: Managed Care, Other (non HMO) | Admitting: Physician Assistant

## 2022-02-23 DIAGNOSIS — M1711 Unilateral primary osteoarthritis, right knee: Principal | ICD-10-CM | POA: Diagnosis present

## 2022-02-23 DIAGNOSIS — Z87891 Personal history of nicotine dependence: Secondary | ICD-10-CM | POA: Diagnosis not present

## 2022-02-23 DIAGNOSIS — I10 Essential (primary) hypertension: Secondary | ICD-10-CM | POA: Diagnosis not present

## 2022-02-23 DIAGNOSIS — Z96652 Presence of left artificial knee joint: Secondary | ICD-10-CM | POA: Insufficient documentation

## 2022-02-23 DIAGNOSIS — Z96651 Presence of right artificial knee joint: Secondary | ICD-10-CM

## 2022-02-23 DIAGNOSIS — Z79899 Other long term (current) drug therapy: Secondary | ICD-10-CM | POA: Diagnosis not present

## 2022-02-23 DIAGNOSIS — Z7982 Long term (current) use of aspirin: Secondary | ICD-10-CM | POA: Insufficient documentation

## 2022-02-23 HISTORY — PX: TOTAL KNEE ARTHROPLASTY: SHX125

## 2022-02-23 SURGERY — ARTHROPLASTY, KNEE, TOTAL
Anesthesia: Regional | Site: Knee | Laterality: Right

## 2022-02-23 MED ORDER — ENALAPRIL MALEATE 2.5 MG PO TABS: 2.5000 mg | ORAL_TABLET | Freq: Every day | ORAL | Status: DC

## 2022-02-23 MED ORDER — KETOROLAC TROMETHAMINE 15 MG/ML IJ SOLN
15.0000 mg | Freq: Four times a day (QID) | INTRAMUSCULAR | Status: AC
  Administered 2022-02-23 – 2022-02-24 (×4): 15 mg via INTRAVENOUS
  Filled 2022-02-23 (×3): qty 1

## 2022-02-23 MED ORDER — SODIUM CHLORIDE (PF) 0.9 % IJ SOLN
INTRAMUSCULAR | Status: DC | PRN
  Administered 2022-02-23: 30 mL

## 2022-02-23 MED ORDER — METOCLOPRAMIDE HCL 5 MG PO TABS: 5.0000 mg | ORAL_TABLET | Freq: Three times a day (TID) | ORAL | Status: DC | PRN

## 2022-02-23 MED ORDER — OXYCODONE HCL 5 MG PO TABS
10.0000 mg | ORAL_TABLET | ORAL | Status: DC | PRN
  Administered 2022-02-23 – 2022-02-24 (×4): 15 mg via ORAL
  Filled 2022-02-23 (×4): qty 3

## 2022-02-23 MED ORDER — ROPIVACAINE HCL 7.5 MG/ML IJ SOLN
INTRAMUSCULAR | Status: DC | PRN
  Administered 2022-02-23: 20 mL via PERINEURAL

## 2022-02-23 MED ORDER — HYDROMORPHONE HCL 1 MG/ML IJ SOLN
0.5000 mg | Freq: Once | INTRAMUSCULAR | Status: AC
  Administered 2022-02-23: 0.5 mg via INTRAVENOUS

## 2022-02-23 MED ORDER — ACETAMINOPHEN 500 MG PO TABS
1000.0000 mg | ORAL_TABLET | Freq: Four times a day (QID) | ORAL | Status: DC
  Administered 2022-02-23 – 2022-02-24 (×3): 1000 mg via ORAL
  Filled 2022-02-23 (×3): qty 2

## 2022-02-23 MED ORDER — HYDRALAZINE HCL 20 MG/ML IJ SOLN
INTRAMUSCULAR | Status: AC
  Filled 2022-02-23: qty 1

## 2022-02-23 MED ORDER — LACTATED RINGERS IV SOLN: INTRAVENOUS | Status: DC

## 2022-02-23 MED ORDER — KETOROLAC TROMETHAMINE 15 MG/ML IJ SOLN
INTRAMUSCULAR | Status: AC
  Filled 2022-02-23: qty 1

## 2022-02-23 MED ORDER — DEXMEDETOMIDINE HCL IN NACL 80 MCG/20ML IV SOLN
INTRAVENOUS | Status: AC
  Filled 2022-02-23: qty 20

## 2022-02-23 MED ORDER — PHENOL 1.4 % MT LIQD: 1.0000 | OROMUCOSAL | Status: DC | PRN

## 2022-02-23 MED ORDER — ORAL CARE MOUTH RINSE: 15.0000 mL | OROMUCOSAL | Status: DC | PRN

## 2022-02-23 MED ORDER — ASPIRIN 81 MG PO CHEW
81.0000 mg | CHEWABLE_TABLET | Freq: Two times a day (BID) | ORAL | Status: DC
  Administered 2022-02-24: 81 mg via ORAL
  Filled 2022-02-23: qty 1

## 2022-02-23 MED ORDER — ASPIRIN EC 81 MG PO TBEC: 81.0000 mg | DELAYED_RELEASE_TABLET | Freq: Two times a day (BID) | ORAL | 0 refills | Status: DC

## 2022-02-23 MED ORDER — ENALAPRIL-HYDROCHLOROTHIAZIDE 10-25 MG PO TABS: 1.0000 | ORAL_TABLET | Freq: Every day | ORAL | Status: DC

## 2022-02-23 MED ORDER — MIDAZOLAM HCL 2 MG/2ML IJ SOLN
INTRAMUSCULAR | Status: AC
  Filled 2022-02-23: qty 2

## 2022-02-23 MED ORDER — BUPIVACAINE-EPINEPHRINE (PF) 0.5% -1:200000 IJ SOLN
INTRAMUSCULAR | Status: AC
  Filled 2022-02-23: qty 30

## 2022-02-23 MED ORDER — FENTANYL CITRATE (PF) 100 MCG/2ML IJ SOLN
INTRAMUSCULAR | Status: AC
  Filled 2022-02-23: qty 2

## 2022-02-23 MED ORDER — METHOCARBAMOL 500 MG IVPB - SIMPLE MED
500.0000 mg | Freq: Four times a day (QID) | INTRAVENOUS | Status: DC | PRN
  Administered 2022-02-23: 500 mg via INTRAVENOUS

## 2022-02-23 MED ORDER — BISACODYL 5 MG PO TBEC: 5.0000 mg | DELAYED_RELEASE_TABLET | Freq: Every day | ORAL | Status: DC | PRN

## 2022-02-23 MED ORDER — ACETAMINOPHEN 325 MG PO TABS: 325.0000 mg | ORAL_TABLET | Freq: Four times a day (QID) | ORAL | Status: DC | PRN

## 2022-02-23 MED ORDER — BUPIVACAINE LIPOSOME 1.3 % IJ SUSP
INTRAMUSCULAR | Status: AC
  Filled 2022-02-23: qty 20

## 2022-02-23 MED ORDER — BUPIVACAINE-EPINEPHRINE (PF) 0.25% -1:200000 IJ SOLN
INTRAMUSCULAR | Status: DC | PRN
  Administered 2022-02-23: 30 mL

## 2022-02-23 MED ORDER — CEFAZOLIN SODIUM-DEXTROSE 2-4 GM/100ML-% IV SOLN
2.0000 g | INTRAVENOUS | Status: AC
  Administered 2022-02-23: 2 g via INTRAVENOUS
  Filled 2022-02-23: qty 100

## 2022-02-23 MED ORDER — ACETAMINOPHEN 10 MG/ML IV SOLN
1000.0000 mg | Freq: Once | INTRAVENOUS | Status: AC
  Administered 2022-02-23: 1000 mg via INTRAVENOUS

## 2022-02-23 MED ORDER — DEXMEDETOMIDINE HCL IN NACL 80 MCG/20ML IV SOLN: INTRAVENOUS | Status: DC | PRN

## 2022-02-23 MED ORDER — DEXAMETHASONE SODIUM PHOSPHATE 10 MG/ML IJ SOLN
INTRAMUSCULAR | Status: AC
  Filled 2022-02-23: qty 1

## 2022-02-23 MED ORDER — BUPIVACAINE-EPINEPHRINE (PF) 0.25% -1:200000 IJ SOLN
INTRAMUSCULAR | Status: AC
  Filled 2022-02-23: qty 30

## 2022-02-23 MED ORDER — SODIUM CHLORIDE 0.9% IV SOLUTION
INTRAVENOUS | Status: AC | PRN
  Administered 2022-02-23: 3000 mL

## 2022-02-23 MED ORDER — ACETAMINOPHEN 10 MG/ML IV SOLN: 1000.0000 mg | Freq: Once | INTRAVENOUS | Status: DC | PRN

## 2022-02-23 MED ORDER — TIZANIDINE HCL 4 MG PO TABS: 4.0000 mg | ORAL_TABLET | Freq: Four times a day (QID) | ORAL | 1 refills | Status: AC | PRN

## 2022-02-23 MED ORDER — LACTATED RINGERS IV BOLUS
500.0000 mL | Freq: Once | INTRAVENOUS | Status: AC
  Administered 2022-02-23: 500 mL via INTRAVENOUS

## 2022-02-23 MED ORDER — DOCUSATE SODIUM 100 MG PO CAPS
100.0000 mg | ORAL_CAPSULE | Freq: Two times a day (BID) | ORAL | Status: DC
  Administered 2022-02-23 – 2022-02-24 (×2): 100 mg via ORAL
  Filled 2022-02-23 (×2): qty 1

## 2022-02-23 MED ORDER — SODIUM CHLORIDE (PF) 0.9 % IJ SOLN
INTRAMUSCULAR | Status: AC
  Filled 2022-02-23: qty 30

## 2022-02-23 MED ORDER — PROPOFOL 1000 MG/100ML IV EMUL
INTRAVENOUS | Status: AC
  Filled 2022-02-23: qty 100

## 2022-02-23 MED ORDER — METHOCARBAMOL 500 MG IVPB - SIMPLE MED
INTRAVENOUS | Status: AC
  Filled 2022-02-23: qty 55

## 2022-02-23 MED ORDER — HYDROCHLOROTHIAZIDE 25 MG PO TABS
25.0000 mg | ORAL_TABLET | Freq: Every day | ORAL | Status: DC
  Administered 2022-02-23 – 2022-02-24 (×2): 12.5 mg via ORAL
  Filled 2022-02-23 (×2): qty 1

## 2022-02-23 MED ORDER — ORAL CARE MOUTH RINSE: 15.0000 mL | Freq: Once | OROMUCOSAL | Status: AC

## 2022-02-23 MED ORDER — TRANEXAMIC ACID 1000 MG/10ML IV SOLN
INTRAVENOUS | Status: DC | PRN
  Administered 2022-02-23: 2000 mg via TOPICAL

## 2022-02-23 MED ORDER — LACTATED RINGERS IV BOLUS: 250.0000 mL | Freq: Once | INTRAVENOUS | Status: DC

## 2022-02-23 MED ORDER — METOCLOPRAMIDE HCL 5 MG/ML IJ SOLN: 5.0000 mg | Freq: Three times a day (TID) | INTRAMUSCULAR | Status: DC | PRN

## 2022-02-23 MED ORDER — MENTHOL 3 MG MT LOZG: 1.0000 | LOZENGE | OROMUCOSAL | Status: DC | PRN

## 2022-02-23 MED ORDER — FENTANYL CITRATE PF 50 MCG/ML IJ SOSY
PREFILLED_SYRINGE | INTRAMUSCULAR | Status: AC
  Filled 2022-02-23: qty 3

## 2022-02-23 MED ORDER — HYDROMORPHONE HCL 1 MG/ML IJ SOLN
INTRAMUSCULAR | Status: DC | PRN
  Administered 2022-02-23 (×2): .2 mg via INTRAVENOUS
  Administered 2022-02-23 (×4): .4 mg via INTRAVENOUS

## 2022-02-23 MED ORDER — METHOCARBAMOL 500 MG PO TABS
500.0000 mg | ORAL_TABLET | Freq: Four times a day (QID) | ORAL | Status: DC | PRN
  Administered 2022-02-24: 500 mg via ORAL
  Filled 2022-02-23: qty 1

## 2022-02-23 MED ORDER — ALUM & MAG HYDROXIDE-SIMETH 200-200-20 MG/5ML PO SUSP: 30.0000 mL | ORAL | Status: DC | PRN

## 2022-02-23 MED ORDER — PROPOFOL 10 MG/ML IV BOLUS
INTRAVENOUS | Status: AC
  Filled 2022-02-23: qty 20

## 2022-02-23 MED ORDER — BUPIVACAINE LIPOSOME 1.3 % IJ SUSP: 20.0000 mL | Freq: Once | INTRAMUSCULAR | Status: AC

## 2022-02-23 MED ORDER — OXYCODONE-ACETAMINOPHEN 5-325 MG PO TABS: 1.0000 | ORAL_TABLET | Freq: Four times a day (QID) | ORAL | 0 refills | Status: DC | PRN

## 2022-02-23 MED ORDER — HYDROMORPHONE HCL 1 MG/ML IJ SOLN
0.5000 mg | INTRAMUSCULAR | Status: DC | PRN
  Administered 2022-02-23: 1 mg via INTRAVENOUS
  Filled 2022-02-23: qty 1

## 2022-02-23 MED ORDER — HYDROMORPHONE HCL 2 MG/ML IJ SOLN
INTRAMUSCULAR | Status: AC
  Filled 2022-02-23: qty 1

## 2022-02-23 MED ORDER — ONDANSETRON HCL 4 MG/2ML IJ SOLN
INTRAMUSCULAR | Status: DC | PRN
  Administered 2022-02-23: 4 mg via INTRAVENOUS

## 2022-02-23 MED ORDER — TRANEXAMIC ACID-NACL 1000-0.7 MG/100ML-% IV SOLN
1000.0000 mg | INTRAVENOUS | Status: AC
  Administered 2022-02-23: 1000 mg via INTRAVENOUS
  Filled 2022-02-23: qty 100

## 2022-02-23 MED ORDER — MIDAZOLAM HCL 2 MG/2ML IJ SOLN
INTRAMUSCULAR | Status: DC | PRN
  Administered 2022-02-23: 2 mg via INTRAVENOUS

## 2022-02-23 MED ORDER — DEXMEDETOMIDINE HCL IN NACL 80 MCG/20ML IV SOLN
INTRAVENOUS | Status: DC | PRN
  Administered 2022-02-23: 8 ug via INTRAVENOUS
  Administered 2022-02-23 (×3): 4 ug via INTRAVENOUS

## 2022-02-23 MED ORDER — HYDRALAZINE HCL 20 MG/ML IJ SOLN
10.0000 mg | Freq: Once | INTRAMUSCULAR | Status: AC
  Administered 2022-02-23: 10 mg via INTRAVENOUS

## 2022-02-23 MED ORDER — FLUCONAZOLE 150 MG PO TABS
150.0000 mg | ORAL_TABLET | Freq: Once | ORAL | Status: AC
  Administered 2022-02-23: 150 mg via ORAL
  Filled 2022-02-23: qty 1

## 2022-02-23 MED ORDER — CHLORHEXIDINE GLUCONATE 0.12 % MT SOLN
15.0000 mL | Freq: Once | OROMUCOSAL | Status: AC
  Administered 2022-02-23: 15 mL via OROMUCOSAL

## 2022-02-23 MED ORDER — FENTANYL CITRATE PF 50 MCG/ML IJ SOSY
25.0000 ug | PREFILLED_SYRINGE | INTRAMUSCULAR | Status: DC | PRN
  Administered 2022-02-23 (×3): 50 ug via INTRAVENOUS

## 2022-02-23 MED ORDER — LABETALOL HCL 5 MG/ML IV SOLN
5.0000 mg | Freq: Once | INTRAVENOUS | Status: AC
  Administered 2022-02-23: 5 mg via INTRAVENOUS

## 2022-02-23 MED ORDER — PROPOFOL 10 MG/ML IV BOLUS
INTRAVENOUS | Status: DC | PRN
  Administered 2022-02-23: 200 mg via INTRAVENOUS

## 2022-02-23 MED ORDER — LIDOCAINE 2% (20 MG/ML) 5 ML SYRINGE
INTRAMUSCULAR | Status: DC | PRN
  Administered 2022-02-23: 80 mg via INTRAVENOUS

## 2022-02-23 MED ORDER — HYDROMORPHONE HCL 1 MG/ML IJ SOLN
INTRAMUSCULAR | Status: AC
  Filled 2022-02-23: qty 1

## 2022-02-23 MED ORDER — DEXAMETHASONE SODIUM PHOSPHATE 10 MG/ML IJ SOLN
INTRAMUSCULAR | Status: DC | PRN
  Administered 2022-02-23: 8 mg via INTRAVENOUS

## 2022-02-23 MED ORDER — LABETALOL HCL 5 MG/ML IV SOLN
INTRAVENOUS | Status: AC
  Filled 2022-02-23: qty 4

## 2022-02-23 MED ORDER — ACETAMINOPHEN 10 MG/ML IV SOLN
INTRAVENOUS | Status: AC
  Filled 2022-02-23: qty 100

## 2022-02-23 MED ORDER — TRANEXAMIC ACID-NACL 1000-0.7 MG/100ML-% IV SOLN
1000.0000 mg | Freq: Once | INTRAVENOUS | Status: AC
  Administered 2022-02-23: 1000 mg via INTRAVENOUS
  Filled 2022-02-23: qty 100

## 2022-02-23 MED ORDER — ONDANSETRON HCL 4 MG PO TABS: 4.0000 mg | ORAL_TABLET | Freq: Four times a day (QID) | ORAL | Status: DC | PRN

## 2022-02-23 MED ORDER — BUPIVACAINE LIPOSOME 1.3 % IJ SUSP
INTRAMUSCULAR | Status: DC | PRN
  Administered 2022-02-23: 20 mL

## 2022-02-23 MED ORDER — ONDANSETRON HCL 4 MG/2ML IJ SOLN: 4.0000 mg | Freq: Four times a day (QID) | INTRAMUSCULAR | Status: DC | PRN

## 2022-02-23 MED ORDER — POVIDONE-IODINE 10 % EX SWAB
2.0000 | Freq: Once | CUTANEOUS | Status: AC
  Administered 2022-02-23: 2 via TOPICAL

## 2022-02-23 MED ORDER — 0.9 % SODIUM CHLORIDE (POUR BTL) OPTIME
TOPICAL | Status: DC | PRN
  Administered 2022-02-23: 1000 mL

## 2022-02-23 MED ORDER — FENTANYL CITRATE (PF) 100 MCG/2ML IJ SOLN
INTRAMUSCULAR | Status: DC | PRN
  Administered 2022-02-23: 100 ug via INTRAVENOUS
  Administered 2022-02-23: 50 ug via INTRAVENOUS
  Administered 2022-02-23 (×2): 25 ug via INTRAVENOUS

## 2022-02-23 MED ORDER — DIPHENHYDRAMINE HCL 12.5 MG/5ML PO ELIX: 12.5000 mg | ORAL_SOLUTION | ORAL | Status: DC | PRN

## 2022-02-23 MED ORDER — OXYCODONE HCL 5 MG PO TABS: 5.0000 mg | ORAL_TABLET | ORAL | Status: DC | PRN

## 2022-02-23 MED ORDER — CEFAZOLIN SODIUM-DEXTROSE 2-4 GM/100ML-% IV SOLN
2.0000 g | Freq: Four times a day (QID) | INTRAVENOUS | Status: AC
  Administered 2022-02-23 (×2): 2 g via INTRAVENOUS
  Filled 2022-02-23 (×2): qty 100

## 2022-02-23 MED ORDER — ONDANSETRON HCL 4 MG/2ML IJ SOLN
INTRAMUSCULAR | Status: AC
  Filled 2022-02-23: qty 2

## 2022-02-23 MED ORDER — ENALAPRIL MALEATE 10 MG PO TABS
10.0000 mg | ORAL_TABLET | Freq: Every day | ORAL | Status: DC
  Administered 2022-02-23 – 2022-02-24 (×2): 5 mg via ORAL
  Filled 2022-02-23 (×2): qty 1

## 2022-02-23 MED ORDER — HYDROMORPHONE HCL 1 MG/ML IJ SOLN
0.5000 mg | INTRAMUSCULAR | Status: AC | PRN
  Administered 2022-02-23 (×2): 0.5 mg via INTRAVENOUS

## 2022-02-23 SURGICAL SUPPLY — 52 items
ATTUNE PSFEM RTSZ6 NARCEM KNEE (Femur) IMPLANT
ATTUNE PSRP INSR SZ6 6 KNEE (Insert) IMPLANT
BAG COUNTER SPONGE SURGICOUNT (BAG) ×1 IMPLANT
BAG DECANTER FOR FLEXI CONT (MISCELLANEOUS) ×1 IMPLANT
BAG ZIPLOCK 12X15 (MISCELLANEOUS) ×1 IMPLANT
BASE TIBIA ATTUNE KNEE SYS SZ6 (Knees) IMPLANT
BLADE SAGITTAL 25.0X1.19X90 (BLADE) ×1 IMPLANT
BLADE SAW SGTL 11.0X1.19X90.0M (BLADE) ×1 IMPLANT
BLADE SURG SZ10 CARB STEEL (BLADE) ×1 IMPLANT
BNDG ELASTIC 6X5.8 VLCR STR LF (GAUZE/BANDAGES/DRESSINGS) ×1 IMPLANT
BOOTIES KNEE HIGH SLOAN (MISCELLANEOUS) ×1 IMPLANT
BOWL SMART MIX CTS (DISPOSABLE) ×1 IMPLANT
CEMENT HV SMART SET (Cement) ×2 IMPLANT
COVER SURGICAL LIGHT HANDLE (MISCELLANEOUS) ×1 IMPLANT
CUFF TOURN SGL QUICK 34 (TOURNIQUET CUFF) ×1
CUFF TRNQT CYL 34X4.125X (TOURNIQUET CUFF) ×1 IMPLANT
DRAPE INCISE IOBAN 66X45 STRL (DRAPES) ×1 IMPLANT
DRAPE SHEET LG 3/4 BI-LAMINATE (DRAPES) ×1 IMPLANT
DRAPE TOP 10253 STERILE (DRAPES) ×1 IMPLANT
DRAPE U-SHAPE 47X51 STRL (DRAPES) ×1 IMPLANT
DRSG AQUACEL AG ADV 3.5X10 (GAUZE/BANDAGES/DRESSINGS) ×1 IMPLANT
DURAPREP 26ML APPLICATOR (WOUND CARE) ×2 IMPLANT
ELECT REM PT RETURN 15FT ADLT (MISCELLANEOUS) ×1 IMPLANT
GLOVE BIO SURGEON STRL SZ8 (GLOVE) ×2 IMPLANT
GLOVE BIOGEL PI IND STRL 8 (GLOVE) ×2 IMPLANT
GOWN STRL REUS W/ TWL XL LVL3 (GOWN DISPOSABLE) ×2 IMPLANT
GOWN STRL REUS W/TWL XL LVL3 (GOWN DISPOSABLE) ×2
HANDPIECE INTERPULSE COAX TIP (DISPOSABLE) ×1
HOLDER FOLEY CATH W/STRAP (MISCELLANEOUS) IMPLANT
HOOD PEEL AWAY FLYTE STAYCOOL (MISCELLANEOUS) ×3 IMPLANT
KIT TURNOVER KIT A (KITS) IMPLANT
MANIFOLD NEPTUNE II (INSTRUMENTS) ×1 IMPLANT
NEEDLE HYPO 22GX1.5 SAFETY (NEEDLE) ×1 IMPLANT
NS IRRIG 1000ML POUR BTL (IV SOLUTION) ×1 IMPLANT
PACK ICE MAXI GEL EZY WRAP (MISCELLANEOUS) IMPLANT
PACK TOTAL KNEE CUSTOM (KITS) ×1 IMPLANT
PAD ARMBOARD 7.5X6 YLW CONV (MISCELLANEOUS) ×1 IMPLANT
PATELLA MEDIAL ATTUN 35MM KNEE (Knees) IMPLANT
PROTECTOR NERVE ULNAR (MISCELLANEOUS) ×1 IMPLANT
SET HNDPC FAN SPRY TIP SCT (DISPOSABLE) ×1 IMPLANT
SPIKE FLUID TRANSFER (MISCELLANEOUS) ×2 IMPLANT
SUT ETHIBOND NAB CT1 #1 30IN (SUTURE) ×2 IMPLANT
SUT VIC AB 0 CT1 36 (SUTURE) ×1 IMPLANT
SUT VIC AB 2-0 CT1 27 (SUTURE) ×1
SUT VIC AB 2-0 CT1 TAPERPNT 27 (SUTURE) ×1 IMPLANT
SUT VICRYL AB 3-0 FS1 BRD 27IN (SUTURE) ×1 IMPLANT
SUT VLOC 180 0 24IN GS25 (SUTURE) ×1 IMPLANT
TIBIA ATTUNE KNEE SYS BASE SZ6 (Knees) ×1 IMPLANT
TRAY FOLEY MTR SLVR 16FR STAT (SET/KITS/TRAYS/PACK) IMPLANT
WATER STERILE IRR 1000ML POUR (IV SOLUTION) ×1 IMPLANT
WRAP KNEE MAXI GEL POST OP (GAUZE/BANDAGES/DRESSINGS) ×1 IMPLANT
YANKAUER SUCT BULB TIP NO VENT (SUCTIONS) ×1 IMPLANT

## 2022-02-23 NOTE — Anesthesia Preprocedure Evaluation (Addendum)
Anesthesia Evaluation  Patient identified by MRN, date of birth, ID band Patient awake    Reviewed: Allergy & Precautions, NPO status , Patient's Chart, lab work & pertinent test results  Airway Mallampati: II  TM Distance: >3 FB Neck ROM: Full    Dental no notable dental hx.    Pulmonary neg pulmonary ROS, former smoker,    Pulmonary exam normal        Cardiovascular hypertension, Pt. on medications  Rhythm:Regular Rate:Normal     Neuro/Psych  Headaches, negative psych ROS   GI/Hepatic GERD  ,(+)     substance abuse  alcohol use,   Endo/Other  negative endocrine ROS  Renal/GU negative Renal ROS  negative genitourinary   Musculoskeletal  (+) Arthritis , Osteoarthritis,    Abdominal Normal abdominal exam  (+)   Peds  Hematology negative hematology ROS (+)   Anesthesia Other Findings   Reproductive/Obstetrics                            Anesthesia Physical Anesthesia Plan  ASA: 2  Anesthesia Plan: General and Regional   Post-op Pain Management: Regional block*   Induction: Intravenous  PONV Risk Score and Plan: 3 and Ondansetron, Dexamethasone, Midazolam and Treatment may vary due to age or medical condition  Airway Management Planned: Mask and LMA  Additional Equipment: None  Intra-op Plan:   Post-operative Plan: Extubation in OR  Informed Consent: I have reviewed the patients History and Physical, chart, labs and discussed the procedure including the risks, benefits and alternatives for the proposed anesthesia with the patient or authorized representative who has indicated his/her understanding and acceptance.     Dental advisory given  Plan Discussed with: CRNA  Anesthesia Plan Comments: (DOS note: PATIENT REFUSED SPINAL WITH LEFT TKR 03/23, WILL PROCEED WITH GA TODAY AS WELL  Lab Results      Component                Value               Date                       WBC                      6.1                 02/10/2022                HGB                      13.2                02/10/2022                HCT                      41.3                02/10/2022                MCV                      88.6                02/10/2022  PLT                      258                 02/10/2022           Lab Results      Component                Value               Date                      NA                       139                 02/10/2022                K                        3.5                 02/10/2022                CO2                      19 (L)              02/10/2022                GLUCOSE                  117 (H)             02/10/2022                BUN                      12                  02/10/2022                CREATININE               0.52                02/10/2022                CALCIUM                  9.0                 02/10/2022                EGFR                     109                 08/18/2021                GFRNONAA                 >60                 02/10/2022          )       Anesthesia Quick Evaluation

## 2022-02-23 NOTE — Transfer of Care (Signed)
Immediate Anesthesia Transfer of Care Note  Patient: Kathryn Delgado  Procedure(s) Performed: RIGHT TOTAL KNEE ARTHROPLASTY (Right: Knee)  Patient Location: PACU  Anesthesia Type:General  Level of Consciousness: awake, alert  and oriented  Airway & Oxygen Therapy: Patient Spontanous Breathing and Patient connected to face mask oxygen  Post-op Assessment: Report given to RN, Post -op Vital signs reviewed and stable and Patient moving all extremities X 4  Post vital signs: Reviewed and stable  Last Vitals:  Vitals Value Taken Time  BP 198/102 02/23/22 0949  Temp    Pulse 107 02/23/22 0950  Resp 35 02/23/22 0950  SpO2 96 % 02/23/22 0950  Vitals shown include unvalidated device data.  Last Pain: There were no vitals filed for this visit.       Complications: No notable events documented.

## 2022-02-23 NOTE — Anesthesia Procedure Notes (Signed)
Procedure Name: LMA Insertion Date/Time: 02/23/2022 8:02 AM  Performed by: Niel Hummer, CRNAPre-anesthesia Checklist: Patient identified, Emergency Drugs available, Suction available and Patient being monitored Patient Re-evaluated:Patient Re-evaluated prior to induction Oxygen Delivery Method: Circle system utilized Preoxygenation: Pre-oxygenation with 100% oxygen Induction Type: IV induction LMA: LMA with gastric port inserted LMA Size: 4.0 Number of attempts: 2 Dental Injury: Teeth and Oropharynx as per pre-operative assessment

## 2022-02-23 NOTE — Op Note (Signed)
PREOP DIAGNOSIS: DJD RIGHT KNEE POSTOP DIAGNOSIS: same PROCEDURE: RIGHT TKR ANESTHESIA: General ATTENDING SURGEON: Hessie Dibble ASSISTANT: Loni Dolly PA  INDICATIONS FOR PROCEDURE: Kathryn Delgado is a 56 y.o. female who has struggled for a long time with pain due to degenerative arthritis of the right knee.  The patient has failed many conservative non-operative measures and at this point has pain which limits the ability to sleep and walk.  The patient is offered total knee replacement.  Informed operative consent was obtained after discussion of possible risks of anesthesia, infection, neurovascular injury, DVT, and death.  The importance of the post-operative rehabilitation protocol to optimize result was stressed extensively with the patient.  SUMMARY OF FINDINGS AND PROCEDURE:  Kathryn Delgado was taken to the operative suite where under the above anesthesia a right knee replacement was performed.  There were advanced degenerative changes and the bone quality was good.  We used the DePuy Attune system and placed size 6 narrow femur, 6 tibia, 35 mm all polyethylene patella, and a size 6 mm spacer.  Loni Dolly PA-C assisted throughout and was invaluable to the completion of the case in that he helped retract and maintain exposure while I placed components.  He also helped close thereby minimizing OR time.  The patient was admitted for appropriate post-op care to include perioperative antibiotics and mechanical and pharmacologic measures for DVT prophylaxis.  DESCRIPTION OF PROCEDURE:  Kathryn Delgado was taken to the operative suite where the above anesthesia was applied.  The patient was positioned supine and prepped and draped in normal sterile fashion.  An appropriate time out was performed.  After the administration of kefzol pre-op antibiotic the leg was elevated and exsanguinated and a tourniquet inflated. A standard longitudinal incision was made on the anterior knee.  Dissection was carried down  to the extensor mechanism.  All appropriate anti-infective measures were used including the pre-operative antibiotic, betadine impregnated drape, and closed hooded exhaust systems for each member of the surgical team.  A medial parapatellar incision was made in the extensor mechanism and the knee cap flipped and the knee flexed.  Some residual meniscal tissues were removed along with any remaining ACL/PCL tissue.  A guide was placed on the tibia and a flat cut was made on it's superior surface.  An intramedullary guide was placed in the femur and was utilized to make anterior and posterior cuts creating an appropriate flexion gap.  A second intramedullary guide was placed in the femur to make a distal cut properly balancing the knee with an extension gap equal to the flexion gap.  The three bones sized to the above mentioned sizes and the appropriate guides were placed and utilized.  A trial reduction was done and the knee easily came to full extension and the patella tracked well on flexion.  The trial components were removed and all bones were cleaned with pulsatile lavage and then dried thoroughly.  Cement was mixed and was pressurized onto the bones followed by placement of the aforementioned components.  Excess cement was trimmed and pressure was held on the components until the cement had hardened.  The tourniquet was deflated and a small amount of bleeding was controlled with cautery and pressure.  The knee was irrigated thoroughly.  The extensor mechanism was re-approximated with #1 ethibond in interrupted fashion.  The knee was flexed and the repair was solid.  The subcutaneous tissues were re-approximated with #0 and #2-0 vicryl and the skin closed with a subcuticular stitch and  steristrips.  A sterile dressing was applied.  Intraoperative fluids, EBL, and tourniquet time can be obtained from anesthesia records.  DISPOSITION:  The patient was taken to recovery room in stable condition and scheduled to  potentially go home same day depending on ability to walk and tolerate liquids.Kathryn Delgado 02/23/2022, 9:24 AM

## 2022-02-23 NOTE — Interval H&P Note (Signed)
History and Physical Interval Note:  02/23/2022 7:23 AM  Kathryn Delgado  has presented today for surgery, with the diagnosis of RIGHT KNEE DEGENERATIVE JOINT DISEASE.  The various methods of treatment have been discussed with the patient and family. After consideration of risks, benefits and other options for treatment, the patient has consented to  Procedure(s): RIGHT TOTAL KNEE ARTHROPLASTY (Right) as a surgical intervention.  The patient's history has been reviewed, patient examined, no change in status, stable for surgery.  I have reviewed the patient's chart and labs.  Questions were answered to the patient's satisfaction.     Hessie Dibble

## 2022-02-23 NOTE — Progress Notes (Addendum)
Patient presented for surgery today for a Right total knee replacement with dr Rhona Raider.  Patient stated that she "knicked" her knee while shaving this morning.  The area is clean dry and has small area where it appears the top layer of skin was removed.  This area is very close to where surgical incision would be.  Where skin is missing area is bright red in color.   Will notify Dr Rhona Raider this morning via phone.  We will continue to get patient ready for surgery.   Jasmine Pang BSN, Academic librarian - Perioperative Services [place] [phone]   5:55 AM Per Dr Rhona Raider, [facility] to proceed with planned surgery today

## 2022-02-23 NOTE — Anesthesia Procedure Notes (Signed)
Anesthesia Regional Block: Adductor canal block   Pre-Anesthetic Checklist: , timeout performed,  Correct Patient, Correct Site, Correct Laterality,  Correct Procedure, Correct Position, site marked,  Risks and benefits discussed,  Surgical consent,  Pre-op evaluation,  At surgeon's request and post-op pain management  Laterality: Right  Prep: Dura Prep       Needles:  Injection technique: Single-shot  Needle Type: Echogenic Stimulator Needle     Needle Length: 10cm  Needle Gauge: 20     Additional Needles:   Procedures:,,,, ultrasound used (permanent image in chart),,    Narrative:  Start time: 02/23/2022 7:17 AM End time: 02/23/2022 7:20 AM Injection made incrementally with aspirations every 5 mL.  Performed by: Personally  Anesthesiologist: Darral Dash, DO  Additional Notes: Patient identified. Risks/Benefits/Options discussed with patient including but not limited to bleeding, infection, nerve damage, failed block, incomplete pain control. Patient expressed understanding and wished to proceed. All questions were answered. Sterile technique was used throughout the entire procedure. Please see nursing notes for vital signs. Aspirated in 5cc intervals with injection for negative confirmation. Patient was given instructions on fall risk and not to get out of bed. All questions and concerns addressed with instructions to call with any issues or inadequate analgesia.

## 2022-02-23 NOTE — Evaluation (Signed)
Physical Therapy Evaluation Patient Details Name: Kathryn Delgado MRN: [id number] DOB: 04/14/1966 Today's Date: 02/23/2022  History of Present Illness  56 yo female s/p R TKA on 02/23/22. PMH: L TKA, ETOH abuse, liver lesion, HTN, OA, HLD  Clinical Impression  Pt is s/p TKA resulting in the deficits listed below (see PT Problem List).  PT amb ~ 54' with min/guard. Anticipate steady progress.   Pt will benefit from skilled PT to increase their independence and safety with mobility to allow discharge to the venue listed below.         Recommendations for follow up therapy are one component of a multi-disciplinary discharge planning process, led by the attending physician.  Recommendations may be updated based on patient status, additional functional criteria and insurance authorization.  Follow Up Recommendations Follow physician's recommendations for discharge plan and follow up therapies      Assistance Recommended at Discharge    Patient can return home with the following  Assist for transportation;Help with stairs or ramp for entrance    Equipment Recommendations None recommended by PT  Recommendations for Other Services       Functional Status Assessment Patient has had a recent decline in their functional status and demonstrates the ability to make significant improvements in function in a reasonable and predictable amount of time.     Precautions / Restrictions Precautions Precautions: Knee Restrictions Weight Bearing Restrictions: No Other Position/Activity Restrictions: WBAT      Mobility  Bed Mobility Overal bed mobility: Needs Assistance Bed Mobility: Supine to Sit, Sit to Supine     Supine to sit: Supervision Sit to supine: Supervision   General bed mobility comments: for lines and safety, no physical assist    Transfers Overall transfer level: Needs assistance Equipment used: Rolling walker (2 wheels) Transfers: Sit to/from Stand Sit to Stand: Min guard            General transfer comment: for safety only, cues for hand placement on descent    Ambulation/Gait Ambulation/Gait assistance: Min guard Gait Distance (Feet): 75 Feet Assistive device: Rolling walker (2 wheels) Gait Pattern/deviations: Step-to pattern, Decreased stance time - right       General Gait Details: initial cues for sequence and RW position, demonstrates carryover during session  Stairs            Wheelchair Mobility    Modified Rankin (Stroke Patients Only)       Balance                                             Pertinent Vitals/Pain Pain Assessment Pain Assessment: 0-10 Pain Score: 5  Pain Location: R knee Pain Descriptors / Indicators: Aching, Grimacing, Sore Pain Intervention(s): Limited activity within patient's tolerance, Monitored during session, Premedicated before session, Repositioned    Home Living Family/patient expects to be discharged to:: Private residence Living Arrangements: Non-relatives/Friends Available Help at Discharge: Family Type of Home: House Home Access: Stairs to enter Entrance Stairs-Rails: Left Entrance Stairs-Number of Steps: 15   Home Layout: One level Home Equipment: Conservation officer, nature (2 wheels);Crutches      Prior Function Prior Level of Function : Independent/Modified Independent             Mobility Comments: works at [employer], is independent with no device ADLs Comments: independent     Hand Dominance   Dominant Hand: Left  Extremity/Trunk Assessment   Upper Extremity Assessment Upper Extremity Assessment: Overall WFL for tasks assessed    Lower Extremity Assessment Lower Extremity Assessment: RLE deficits/detail RLE Deficits / Details: ankle WFL, knee extension and hip flexion grossly 2+/5       Communication   Communication: No difficulties  Cognition Arousal/Alertness: Awake/alert Behavior During Therapy: WFL for tasks assessed/performed Overall Cognitive  Status: Within Functional Limits for tasks assessed                                          General Comments      Exercises Total Joint Exercises Ankle Circles/Pumps: AROM, 5 reps, Both Quad Sets:  (instructed in QS) Heel Slides: AAROM, Right (3 reps, gentle ROM)   Assessment/Plan    PT Assessment Patient needs continued PT services  PT Problem List Decreased strength;Decreased mobility;Decreased range of motion;Decreased knowledge of use of DME;Pain       PT Treatment Interventions DME instruction;Therapeutic exercise;Gait training;Functional mobility training;Therapeutic activities;Patient/family education;Stair training    PT Goals (Current goals can be found in the Care Plan section)  Acute Rehab PT Goals Patient Stated Goal: home soon PT Goal Formulation: With patient Time For Goal Achievement: 03/02/22 Potential to Achieve Goals: Good    Frequency 7X/week     Co-evaluation               AM-PAC PT "6 Clicks" Mobility  Outcome Measure Help needed turning from your back to your side while in a flat bed without using bedrails?: A Little Help needed moving from lying on your back to sitting on the side of a flat bed without using bedrails?: A Little Help needed moving to and from a bed to a chair (including a wheelchair)?: A Little Help needed standing up from a chair using your arms (e.g., wheelchair or bedside chair)?: A Little Help needed to walk in hospital room?: A Little Help needed climbing 3-5 steps with a railing? : A Little 6 Click Score: 18    End of Session Equipment Utilized During Treatment: Gait belt Activity Tolerance: Patient tolerated treatment well Patient left: in bed;with call bell/phone within reach;with bed alarm set Nurse Communication: Mobility status PT Visit Diagnosis: Difficulty in walking, not elsewhere classified (R26.2)    Time: 6644-0347 PT Time Calculation (min) (ACUTE ONLY): 18 min   Charges:   PT  Evaluation $PT Eval Low Complexity: 1 Low          August Gosser, PT  Acute Rehab Dept [facility]) [phone]  [facility] Weekend Pager [facility] only)  [phone]  02/23/2022   [facility] 02/23/2022, 6:23 PM

## 2022-02-24 ENCOUNTER — Encounter (HOSPITAL_COMMUNITY): Payer: Self-pay | Admitting: Orthopaedic Surgery

## 2022-02-24 DIAGNOSIS — M1711 Unilateral primary osteoarthritis, right knee: Secondary | ICD-10-CM | POA: Diagnosis not present

## 2022-02-24 NOTE — Plan of Care (Signed)
  Problem: Education: Goal: Knowledge of the prescribed therapeutic regimen will improve Outcome: Progressing   Problem: Activity: Goal: Range of joint motion will improve Outcome: Progressing   Problem: Pain Management: Goal: Pain level will decrease with appropriate interventions Outcome: Progressing   Problem: Safety: Goal: Ability to remain free from injury will improve Outcome: Progressing   

## 2022-02-24 NOTE — Anesthesia Postprocedure Evaluation (Signed)
Anesthesia Post Note  Patient: Kathryn Delgado  Procedure(s) Performed: RIGHT TOTAL KNEE ARTHROPLASTY (Right: Knee)     Patient location during evaluation: PACU Anesthesia Type: Regional and General Level of consciousness: awake and alert Pain management: pain level controlled Vital Signs Assessment: post-procedure vital signs reviewed and stable Respiratory status: spontaneous breathing, nonlabored ventilation, respiratory function stable and patient connected to nasal cannula oxygen Cardiovascular status: blood pressure returned to baseline and stable Postop Assessment: no apparent nausea or vomiting Anesthetic complications: no   No notable events documented.  Last Vitals:  Vitals:   02/24/22 0806 02/24/22 0930  BP: (!) 167/102 (!) 178/97  Pulse: 82 88  Resp: 17   Temp: 37 C   SpO2: 100%     Last Pain:  Vitals:   02/24/22 0858  TempSrc:   PainSc: 3                  March Rummage Emory Leaver

## 2022-02-24 NOTE — Plan of Care (Signed)

## 2022-02-24 NOTE — Discharge Summary (Signed)
Patient ID: Kathryn Delgado MRN: [id number] DOB/AGE: 57-Mar-1967 57 y.o.  Admit date: 02/23/2022 Discharge date: 02/24/2022  Admission Diagnoses:  Principal Problem:   Primary localized osteoarthritis of right knee Active Problems:   S/P total knee arthroplasty, right   Discharge Diagnoses:  Same  Past Medical History:  Diagnosis Date   Alcohol use    Aortic atherosclerosis ([place]) 12/06/2019   Arthritis    self report   Gallstones 05/08/2018   Found on [place]   Gastroesophageal reflux disease 12/20/2018   Headache    Hyperlipidemia    Hypertension    Liver lesion 12/06/2019    Surgeries: Procedure(s): RIGHT TOTAL KNEE ARTHROPLASTY on 02/23/2022   Consultants:   Discharged Condition: Improved  Hospital Course: Cheryll Keisler is an 56 y.o. female who was admitted 02/23/2022 for operative treatment ofPrimary localized osteoarthritis of right knee. Patient has severe unremitting pain that affects sleep, daily activities, and work/hobbies. After pre-op clearance the patient was taken to the operating room on 02/23/2022 and underwent  Procedure(s): RIGHT TOTAL KNEE ARTHROPLASTY.    Patient was given perioperative antibiotics:  Anti-infectives (From admission, onward)    Start     Dose/Rate Route Frequency Ordered Stop   02/23/22 1845  fluconazole (DIFLUCAN) tablet 150 mg        150 mg Oral  Once 02/23/22 1754 02/23/22 2116   02/23/22 1515  ceFAZolin (ANCEF) IVPB 2g/100 mL premix        2 g 200 mL/hr over 30 Minutes Intravenous Every 6 hours 02/23/22 1416 02/24/22 0731   02/23/22 0600  ceFAZolin (ANCEF) IVPB 2g/100 mL premix        2 g 200 mL/hr over 30 Minutes Intravenous On call to O.R. 02/23/22 [id number] 02/23/22 0834        Patient was given sequential compression devices, early ambulation, and chemoprophylaxis to prevent DVT.  Patient benefited maximally from hospital stay and there were no complications.    Recent vital signs: Patient Vitals for the past 24 hrs:  BP Temp Temp  src Pulse Resp SpO2 Height  02/24/22 0430 (!) 171/99 98.6 F (37 C) Oral 97 16 97 % --  02/24/22 0153 (!) 191/109 98.2 F (36.8 C) Oral 78 17 94 % --  02/23/22 2053 (!) 190/109 97.7 F (36.5 C) Oral 80 17 100 % --  02/23/22 1745 (!) 184/109 98.4 F (36.9 C) Oral 97 16 100 % --  02/23/22 1553 -- -- -- -- -- -- 5\' 7"  (1.702 m)  02/23/22 1431 (!) 169/87 98.2 F (36.8 C) Oral 93 20 97 % --  02/23/22 1400 (!) 172/106 -- -- 92 19 98 % --  02/23/22 1353 (!) 168/92 -- -- 95 17 100 % --  02/23/22 1351 (!) 168/92 -- -- 86 16 96 % --  02/23/22 1345 (!) 177/104 -- -- 78 16 95 % --  02/23/22 1336 (!) 183/95 -- -- 79 17 97 % --  02/23/22 1330 (!) 188/103 -- -- 86 15 97 % --  02/23/22 1315 (!) 178/101 -- -- 77 13 98 % --  02/23/22 1300 (!) 181/100 -- -- 88 20 97 % --  02/23/22 1248 (!) 168/107 97.7 F (36.5 C) -- 81 17 99 % --  02/23/22 1200 (!) 166/108 -- -- 91 15 98 % --  02/23/22 1145 (!) 186/111 -- -- 86 (!) 21 95 % --  02/23/22 1130 (!) 197/96 -- -- (!) 105 16 96 % --  02/23/22 1115 (!) 209/118 -- -- (!) 103 16  98 % --  02/23/22 1100 (!) 207/110 -- -- 93 12 96 % --  02/23/22 1030 (!) 199/118 -- -- (!) 105 (!) 27 100 % --  02/23/22 1015 (!) 181/115 -- -- (!) 102 18 100 % --  02/23/22 1000 (!) 187/113 -- -- 97 13 100 % --  02/23/22 0949 (!) 198/102 (!) 97.5 F (36.4 C) -- (!) 107 20 96 % --     Recent laboratory studies: No results for input(s): "WBC", "HGB", "HCT", "PLT", "NA", "K", "CL", "CO2", "BUN", "CREATININE", "GLUCOSE", "INR", "CALCIUM" in the last 72 hours.  Invalid input(s): "PT", "2"   Discharge Medications:   Allergies as of 02/24/2022   No Known Allergies      Medication List     STOP taking these medications    ibuprofen 800 MG tablet Commonly known as: ADVIL       TAKE these medications    aspirin EC 81 MG tablet Take 1 tablet (81 mg total) by mouth 2 (two) times daily after a meal. For 2 weeks then 1 x a day for 2 weeks for blood clot prevention.    enalapril 2.5 MG tablet Commonly known as: Vasotec Take 1 tablet (2.5 mg total) by mouth daily.   enalapril-hydrochlorothiazide 10-25 MG tablet Commonly known as: VASERETIC Take 1 tablet by mouth daily.   oxyCODONE-acetaminophen 5-325 MG tablet Commonly known as: Percocet Take 1-2 tablets by mouth every 6 (six) hours as needed for severe pain or moderate pain (post op pain).   tiZANidine 4 MG tablet Commonly known as: Zanaflex Take 1 tablet (4 mg total) by mouth every 6 (six) hours as needed for muscle spasms.               Durable Medical Equipment  (From admission, onward)           Start     Ordered   02/23/22 1417  DME Walker rolling  Once       Question:  Patient needs a walker to treat with the following condition  Answer:  Primary osteoarthritis of right knee   02/23/22 1416   02/23/22 1417  DME 3 n 1  Once        02/23/22 1416   02/23/22 1417  DME Bedside commode  Once       Question:  Patient needs a bedside commode to treat with the following condition  Answer:  Primary osteoarthritis of right knee   02/23/22 1416            Diagnostic Studies: DG Chest 2 View  Result Date: 02/11/2022 CLINICAL DATA:  Preoperative chest x-ray prior to knee replacement. EXAM: CHEST - 2 VIEW COMPARISON:  None Available. FINDINGS: The heart size and mediastinal contours are within normal limits. Both lungs are clear. The visualized skeletal structures are stable. IMPRESSION: No active cardiopulmonary disease. Electronically Signed   By: Sherian Rein M.D.   On: 02/11/2022 14:17    Disposition: Discharge disposition: 01-Home or Self Care       Discharge Instructions     Call MD / Call 911   Complete by: As directed    If you experience chest pain or shortness of breath, CALL 911 and be transported to the hospital emergency room.  If you develope a fever above 101 F, pus (white drainage) or increased drainage or redness at the wound, or calf pain, call your surgeon's  office.   Call MD / Call 911   Complete by: As directed  If you experience chest pain or shortness of breath, CALL 911 and be transported to the hospital emergency room.  If you develope a fever above 101 F, pus (white drainage) or increased drainage or redness at the wound, or calf pain, call your surgeon's office.   Constipation Prevention   Complete by: As directed    Drink plenty of fluids.  Prune juice may be helpful.  You may use a stool softener, such as Colace (over the counter) 100 mg twice a day.  Use MiraLax (over the counter) for constipation as needed.   Constipation Prevention   Complete by: As directed    Drink plenty of fluids.  Prune juice may be helpful.  You may use a stool softener, such as Colace (over the counter) 100 mg twice a day.  Use MiraLax (over the counter) for constipation as needed.   Diet - low sodium heart healthy   Complete by: As directed    Diet - low sodium heart healthy   Complete by: As directed    Discharge instructions   Complete by: As directed    INSTRUCTIONS AFTER JOINT REPLACEMENT   Remove items at home which could result in a fall. This includes throw rugs or furniture in walking pathways ICE to the affected joint every three hours while awake for 30 minutes at a time, for at least the first 3-5 days, and then as needed for pain and swelling.  Continue to use ice for pain and swelling. You may notice swelling that will progress down to the foot and ankle.  This is normal after surgery.  Elevate your leg when you are not up walking on it.   Continue to use the breathing machine you got in the hospital (incentive spirometer) which will help keep your temperature down.  It is common for your temperature to cycle up and down following surgery, especially at night when you are not up moving around and exerting yourself.  The breathing machine keeps your lungs expanded and your temperature down.   DIET:  As you were doing prior to hospitalization, we  recommend a well-balanced diet.  DRESSING / WOUND CARE / SHOWERING  You may shower 3 days after surgery, but keep the wounds dry during showering.  You may use an occlusive plastic wrap (Press'n Seal for example), NO SOAKING/SUBMERGING IN THE BATHTUB.  If the bandage gets wet, change with a clean dry gauze.  If the incision gets wet, pat the wound dry with a clean towel.  ACTIVITY  Increase activity slowly as tolerated, but follow the weight bearing instructions below.   No driving for 6 weeks or until further direction given by your physician.  You cannot drive while taking narcotics.  No lifting or carrying greater than 10 lbs. until further directed by your surgeon. Avoid periods of inactivity such as sitting longer than an hour when not asleep. This helps prevent blood clots.  You may return to work once you are authorized by your doctor.     WEIGHT BEARING   Weight bearing as tolerated with assist device (walker, cane, etc) as directed, use it as long as suggested by your surgeon or therapist, typically at least 4-6 weeks.   EXERCISES  Results after joint replacement surgery are often greatly improved when you follow the exercise, range of motion and muscle strengthening exercises prescribed by your doctor. Safety measures are also important to protect the joint from further injury. Any time any of these exercises cause you to  have increased pain or swelling, decrease what you are doing until you are comfortable again and then slowly increase them. If you have problems or questions, call your caregiver or physical therapist for advice.   Rehabilitation is important following a joint replacement. After just a few days of immobilization, the muscles of the leg can become weakened and shrink (atrophy).  These exercises are designed to build up the tone and strength of the thigh and leg muscles and to improve motion. Often times heat used for twenty to thirty minutes before working out will  loosen up your tissues and help with improving the range of motion but do not use heat for the first two weeks following surgery (sometimes heat can increase post-operative swelling).   These exercises can be done on a training (exercise) mat, on the floor, on a table or on a bed. Use whatever works the best and is most comfortable for you.    Use music or television while you are exercising so that the exercises are a pleasant break in your day. This will make your life better with the exercises acting as a break in your routine that you can look forward to.   Perform all exercises about fifteen times, three times per day or as directed.  You should exercise both the operative leg and the other leg as well.  Exercises include:   Quad Sets - Tighten up the muscle on the front of the thigh (Quad) and hold for 5-10 seconds.   Straight Leg Raises - With your knee straight (if you were given a brace, keep it on), lift the leg to 60 degrees, hold for 3 seconds, and slowly lower the leg.  Perform this exercise against resistance later as your leg gets stronger.  Leg Slides: Lying on your back, slowly slide your foot toward your buttocks, bending your knee up off the floor (only go as far as is comfortable). Then slowly slide your foot back down until your leg is flat on the floor again.  Angel Wings: Lying on your back spread your legs to the side as far apart as you can without causing discomfort.  Hamstring Strength:  Lying on your back, push your heel against the floor with your leg straight by tightening up the muscles of your buttocks.  Repeat, but this time bend your knee to a comfortable angle, and push your heel against the floor.  You may put a pillow under the heel to make it more comfortable if necessary.   A rehabilitation program following joint replacement surgery can speed recovery and prevent re-injury in the future due to weakened muscles. Contact your doctor or a physical therapist for more  information on knee rehabilitation.    CONSTIPATION  Constipation is defined medically as fewer than three stools per week and severe constipation as less than one stool per week.  Even if you have a regular bowel pattern at home, your normal regimen is likely to be disrupted due to multiple reasons following surgery.  Combination of anesthesia, postoperative narcotics, change in appetite and fluid intake all can affect your bowels.   YOU MUST use at least one of the following options; they are listed in order of increasing strength to get the job done.  They are all available over the counter, and you may need to use some, POSSIBLY even all of these options:    Drink plenty of fluids (prune juice may be helpful) and high fiber foods Colace 100 mg by  mouth twice a day  Senokot for constipation as directed and as needed Dulcolax (bisacodyl), take with full glass of water  Miralax (polyethylene glycol) once or twice a day as needed.  If you have tried all these things and are unable to have a bowel movement in the first 3-4 days after surgery call either your surgeon or your primary doctor.    If you experience loose stools or diarrhea, hold the medications until you stool forms back up.  If your symptoms do not get better within 1 week or if they get worse, check with your doctor.  If you experience "the worst abdominal pain ever" or develop nausea or vomiting, please contact the office immediately for further recommendations for treatment.   ITCHING:  If you experience itching with your medications, try taking only a single pain pill, or even half a pain pill at a time.  You can also use Benadryl over the counter for itching or also to help with sleep.   TED HOSE STOCKINGS:  Use stockings on both legs until for at least 2 weeks or as directed by physician office. They may be removed at night for sleeping.  MEDICATIONS:  See your medication summary on the "After Visit Summary" that nursing will  review with you.  You may have some home medications which will be placed on hold until you complete the course of blood thinner medication.  It is important for you to complete the blood thinner medication as prescribed.  PRECAUTIONS:  If you experience chest pain or shortness of breath - call 911 immediately for transfer to the hospital emergency department.   If you develop a fever greater that 101 F, purulent drainage from wound, increased redness or drainage from wound, foul odor from the wound/dressing, or calf pain - CONTACT YOUR SURGEON.                                                   FOLLOW-UP APPOINTMENTS:  If you do not already have a post-op appointment, please call the office for an appointment to be seen by your surgeon.  Guidelines for how soon to be seen are listed in your "After Visit Summary", but are typically between 1-4 weeks after surgery.  OTHER INSTRUCTIONS:   Knee Replacement:  Do not place pillow under knee, focus on keeping the knee straight while resting. CPM instructions: 0-90 degrees, 2 hours in the morning, 2 hours in the afternoon, and 2 hours in the evening. Place foam block, curve side up under heel at all times except when in CPM or when walking.  DO NOT modify, tear, cut, or change the foam block in any way.  POST-OPERATIVE OPIOID TAPER INSTRUCTIONS: It is important to wean off of your opioid medication as soon as possible. If you do not need pain medication after your surgery it is ok to stop day one. Opioids include: Codeine, Hydrocodone(Norco, Vicodin), Oxycodone(Percocet, oxycontin) and hydromorphone amongst others.  Long term and even short term use of opiods can cause: Increased pain response Dependence Constipation Depression Respiratory depression And more.  Withdrawal symptoms can include Flu like symptoms Nausea, vomiting And more Techniques to manage these symptoms Hydrate well Eat regular healthy meals Stay active Use relaxation  techniques(deep breathing, meditating, yoga) Do Not substitute Alcohol to help with tapering If you have been on opioids for  less than two weeks and do not have pain than it is ok to stop all together.  Plan to wean off of opioids This plan should start within one week post op of your joint replacement. Maintain the same interval or time between taking each dose and first decrease the dose.  Cut the total daily intake of opioids by one tablet each day Next start to increase the time between doses. The last dose that should be eliminated is the evening dose.     MAKE SURE YOU:  Understand these instructions.  Get help right away if you are not doing well or get worse.    Thank you for letting us be a part of your medical care team.  It is a privilege we respect greatly.  We hope these instructions will help you stay on track for a fast and full recovery!   Discharge instructions   Complete by: As directed    INSTRUCTIONS AFTER JOINT REPLACEMENT   Remove items at home which could result in a fall. This includes throw rugs or furniture in walking pathways ICE to the affected joint every three hours while awake for 30 minutes at a time, for at least the first 3-5 days, and then as needed for pain and swelling.  Continue to use ice for pain and swelling. You may notice swelling that will progress down to the foot and ankle.  This is normal after surgery.  Elevate your leg when you are not up walking on it.   Continue to use the breathing machine you got in the hospital (incentive spirometer) which will help keep your temperature down.  It is common for your temperature to cycle up and down following surgery, especially at night when you are not up moving around and exerting yourself.  The breathing machine keeps your lungs expanded and your temperature down.   DIET:  As you were doing prior to hospitalization, we recommend a well-balanced diet.  DRESSING / WOUND CARE / SHOWERING  You may  shower 3 days after surgery, but keep the wounds dry during showering.  You may use an occlusive plastic wrap (Press'n Seal for example), NO SOAKING/SUBMERGING IN THE BATHTUB.  If the bandage gets wet, change with a clean dry gauze.  If the incision gets wet, pat the wound dry with a clean towel.  ACTIVITY  Increase activity slowly as tolerated, but follow the weight bearing instructions below.   No driving for 6 weeks or until further direction given by your physician.  You cannot drive while taking narcotics.  No lifting or carrying greater than 10 lbs. until further directed by your surgeon. Avoid periods of inactivity such as sitting longer than an hour when not asleep. This helps prevent blood clots.  You may return to work once you are authorized by your doctor.     WEIGHT BEARING   Weight bearing as tolerated with assist device (walker, cane, etc) as directed, use it as long as suggested by your surgeon or therapist, typically at least 4-6 weeks.   EXERCISES  Results after joint replacement surgery are often greatly improved when you follow the exercise, range of motion and muscle strengthening exercises prescribed by your doctor. Safety measures are also important to protect the joint from further injury. Any time any of these exercises cause you to have increased pain or swelling, decrease what you are doing until you are comfortable again and then slowly increase them. If you have problems or questions, call your  caregiver or physical therapist for advice.   Rehabilitation is important following a joint replacement. After just a few days of immobilization, the muscles of the leg can become weakened and shrink (atrophy).  These exercises are designed to build up the tone and strength of the thigh and leg muscles and to improve motion. Often times heat used for twenty to thirty minutes before working out will loosen up your tissues and help with improving the range of motion but do not  use heat for the first two weeks following surgery (sometimes heat can increase post-operative swelling).   These exercises can be done on a training (exercise) mat, on the floor, on a table or on a bed. Use whatever works the best and is most comfortable for you.    Use music or television while you are exercising so that the exercises are a pleasant break in your day. This will make your life better with the exercises acting as a break in your routine that you can look forward to.   Perform all exercises about fifteen times, three times per day or as directed.  You should exercise both the operative leg and the other leg as well.  Exercises include:   Quad Sets - Tighten up the muscle on the front of the thigh (Quad) and hold for 5-10 seconds.   Straight Leg Raises - With your knee straight (if you were given a brace, keep it on), lift the leg to 60 degrees, hold for 3 seconds, and slowly lower the leg.  Perform this exercise against resistance later as your leg gets stronger.  Leg Slides: Lying on your back, slowly slide your foot toward your buttocks, bending your knee up off the floor (only go as far as is comfortable). Then slowly slide your foot back down until your leg is flat on the floor again.  Angel Wings: Lying on your back spread your legs to the side as far apart as you can without causing discomfort.  Hamstring Strength:  Lying on your back, push your heel against the floor with your leg straight by tightening up the muscles of your buttocks.  Repeat, but this time bend your knee to a comfortable angle, and push your heel against the floor.  You may put a pillow under the heel to make it more comfortable if necessary.   A rehabilitation program following joint replacement surgery can speed recovery and prevent re-injury in the future due to weakened muscles. Contact your doctor or a physical therapist for more information on knee rehabilitation.    CONSTIPATION  Constipation is  defined medically as fewer than three stools per week and severe constipation as less than one stool per week.  Even if you have a regular bowel pattern at home, your normal regimen is likely to be disrupted due to multiple reasons following surgery.  Combination of anesthesia, postoperative narcotics, change in appetite and fluid intake all can affect your bowels.   YOU MUST use at least one of the following options; they are listed in order of increasing strength to get the job done.  They are all available over the counter, and you may need to use some, POSSIBLY even all of these options:    Drink plenty of fluids (prune juice may be helpful) and high fiber foods Colace 100 mg by mouth twice a day  Senokot for constipation as directed and as needed Dulcolax (bisacodyl), take with full glass of water  Miralax (polyethylene glycol) once or twice  a day as needed.  If you have tried all these things and are unable to have a bowel movement in the first 3-4 days after surgery call either your surgeon or your primary doctor.    If you experience loose stools or diarrhea, hold the medications until you stool forms back up.  If your symptoms do not get better within 1 week or if they get worse, check with your doctor.  If you experience "the worst abdominal pain ever" or develop nausea or vomiting, please contact the office immediately for further recommendations for treatment.   ITCHING:  If you experience itching with your medications, try taking only a single pain pill, or even half a pain pill at a time.  You can also use Benadryl over the counter for itching or also to help with sleep.   TED HOSE STOCKINGS:  Use stockings on both legs until for at least 2 weeks or as directed by physician office. They may be removed at night for sleeping.  MEDICATIONS:  See your medication summary on the "After Visit Summary" that nursing will review with you.  You may have some home medications which will be placed  on hold until you complete the course of blood thinner medication.  It is important for you to complete the blood thinner medication as prescribed.  PRECAUTIONS:  If you experience chest pain or shortness of breath - call 911 immediately for transfer to the hospital emergency department.   If you develop a fever greater that 101 F, purulent drainage from wound, increased redness or drainage from wound, foul odor from the wound/dressing, or calf pain - CONTACT YOUR SURGEON.                                                   FOLLOW-UP APPOINTMENTS:  If you do not already have a post-op appointment, please call the office for an appointment to be seen by your surgeon.  Guidelines for how soon to be seen are listed in your "After Visit Summary", but are typically between 1-4 weeks after surgery.  OTHER INSTRUCTIONS:   Knee Replacement:  Do not place pillow under knee, focus on keeping the knee straight while resting. CPM instructions: 0-90 degrees, 2 hours in the morning, 2 hours in the afternoon, and 2 hours in the evening. Place foam block, curve side up under heel at all times except when in CPM or when walking.  DO NOT modify, tear, cut, or change the foam block in any way.  POST-OPERATIVE OPIOID TAPER INSTRUCTIONS: It is important to wean off of your opioid medication as soon as possible. If you do not need pain medication after your surgery it is ok to stop day one. Opioids include: Codeine, Hydrocodone(Norco, Vicodin), Oxycodone(Percocet, oxycontin) and hydromorphone amongst others.  Long term and even short term use of opiods can cause: Increased pain response Dependence Constipation Depression Respiratory depression And more.  Withdrawal symptoms can include Flu like symptoms Nausea, vomiting And more Techniques to manage these symptoms Hydrate well Eat regular healthy meals Stay active Use relaxation techniques(deep breathing, meditating, yoga) Do Not substitute Alcohol to help  with tapering If you have been on opioids for less than two weeks and do not have pain than it is ok to stop all together.  Plan to wean off of opioids This plan should start  within one week post op of your joint replacement. Maintain the same interval or time between taking each dose and first decrease the dose.  Cut the total daily intake of opioids by one tablet each day Next start to increase the time between doses. The last dose that should be eliminated is the evening dose.     MAKE SURE YOU:  Understand these instructions.  Get help right away if you are not doing well or get worse.    Thank you for letting us be a part of your medical care team.  It is a privilege we respect greatly.  We hope these instructions will help you stay on track for a fast and full recovery!   Increase activity slowly as tolerated   Complete by: As directed    Increase activity slowly as tolerated   Complete by: As directed    Post-operative opioid taper instructions:   Complete by: As directed    POST-OPERATIVE OPIOID TAPER INSTRUCTIONS: It is important to wean off of your opioid medication as soon as possible. If you do not need pain medication after your surgery it is ok to stop day one. Opioids include: Codeine, Hydrocodone(Norco, Vicodin), Oxycodone(Percocet, oxycontin) and hydromorphone amongst others.  Long term and even short term use of opiods can cause: Increased pain response Dependence Constipation Depression Respiratory depression And more.  Withdrawal symptoms can include Flu like symptoms Nausea, vomiting And more Techniques to manage these symptoms Hydrate well Eat regular healthy meals Stay active Use relaxation techniques(deep breathing, meditating, yoga) Do Not substitute Alcohol to help with tapering If you have been on opioids for less than two weeks and do not have pain than it is ok to stop all together.  Plan to wean off of opioids This plan should start within  one week post op of your joint replacement. Maintain the same interval or time between taking each dose and first decrease the dose.  Cut the total daily intake of opioids by one tablet each day Next start to increase the time between doses. The last dose that should be eliminated is the evening dose.      Post-operative opioid taper instructions:   Complete by: As directed    POST-OPERATIVE OPIOID TAPER INSTRUCTIONS: It is important to wean off of your opioid medication as soon as possible. If you do not need pain medication after your surgery it is ok to stop day one. Opioids include: Codeine, Hydrocodone(Norco, Vicodin), Oxycodone(Percocet, oxycontin) and hydromorphone amongst others.  Long term and even short term use of opiods can cause: Increased pain response Dependence Constipation Depression Respiratory depression And more.  Withdrawal symptoms can include Flu like symptoms Nausea, vomiting And more Techniques to manage these symptoms Hydrate well Eat regular healthy meals Stay active Use relaxation techniques(deep breathing, meditating, yoga) Do Not substitute Alcohol to help with tapering If you have been on opioids for less than two weeks and do not have pain than it is ok to stop all together.  Plan to wean off of opioids This plan should start within one week post op of your joint replacement. Maintain the same interval or time between taking each dose and first decrease the dose.  Cut the total daily intake of opioids by one tablet each day Next start to increase the time between doses. The last dose that should be eliminated is the evening dose.           Follow-up Information     Marcene Corning, MD. Schedule  an appointment as soon as possible for a visit in 2 week(s).   Specialty: Orthopedic Surgery Contact information: [address] [place] [address] [phone]                  Signed: Ginger Organ Alveda Vanhorne 02/24/2022, 7:39  AM

## 2022-02-24 NOTE — Progress Notes (Signed)
Subjective: 1 Day Post-Op Procedure(s) (LRB): RIGHT TOTAL KNEE ARTHROPLASTY (Right)  Patient is doing well this morning and is looking forward to going home.   Activity level:  wbat Diet tolerance:  ok Voiding:  ok Patient reports pain as mild.    Objective: Vital signs in last 24 hours: Temp:  [97.5 F (36.4 C)-98.6 F (37 C)] 98.6 F (37 C) (09/20 0430) Pulse Rate:  [77-107] 97 (09/20 0430) Resp:  [12-27] 16 (09/20 0430) BP: (166-209)/(87-118) 171/99 (09/20 0430) SpO2:  [94 %-100 %] 97 % (09/20 0430)  Labs: No results for input(s): "HGB" in the last 72 hours. No results for input(s): "WBC", "RBC", "HCT", "PLT" in the last 72 hours. No results for input(s): "NA", "K", "CL", "CO2", "BUN", "CREATININE", "GLUCOSE", "CALCIUM" in the last 72 hours. No results for input(s): "LABPT", "INR" in the last 72 hours.  Physical Exam:  Neurologically intact ABD soft Neurovascular intact Sensation intact distally Intact pulses distally Dorsiflexion/Plantar flexion intact Incision: dressing C/D/I and no drainage No cellulitis present Compartment soft  Assessment/Plan:  1 Day Post-Op Procedure(s) (LRB): RIGHT TOTAL KNEE ARTHROPLASTY (Right) Advance diet Up with therapy D/C IV fluids Discharge home with home health today if cleared by PT and doing well.  Follow up in office 2 week. Continue on 81 mg asa bid for DVT prevention.  Kathryn Delgado 02/24/2022, 7:25 AM

## 2022-02-24 NOTE — Progress Notes (Signed)
Provided discharge education/instructions, all questions and concerns addressed. Pt not in acute distress, no SOB, no chest pain or palpitations. Pt states she feels good and ready to go home. Discharged home with belongings.

## 2022-02-24 NOTE — TOC Transition Note (Signed)
Transition of Care [facility]) - CM/SW Discharge Note   Patient Details  Name: Kathryn Delgado MRN: [id number] Date of Birth: 1965/07/07  Transition of Care [facility]) CM/SW Contact:  Lennart Pall, LCSW Phone Number: 02/24/2022, 10:20 AM   Clinical Narrative:    Met with pt and confirming she has all needed DME at home.  OPPT already arranged at [place].  No TOC needs.   Final next level of care: OP Rehab     Patient Goals and CMS Choice Patient states their goals for this hospitalization and ongoing recovery are:: return home      Discharge Placement                       Discharge Plan and Services                DME Arranged: N/A DME Agency: NA                  Social Determinants of Health (SDOH) Interventions     Readmission Risk Interventions    09/02/2021   10:45 AM  Readmission Risk Prevention Plan  Post Dischage Appt Complete  Medication Screening Complete  Transportation Screening Complete

## 2022-02-24 NOTE — Progress Notes (Signed)
Physical Therapy Treatment Patient Details Name: Kathryn Delgado MRN: [id number] DOB: 03-11-1966 Today's Date: 02/24/2022   History of Present Illness 56 yo female s/p R TKA on 02/23/22. PMH: L TKA, ETOH abuse, liver lesion, HTN, OA, HLD    PT Comments    Pt ambulated in hallway and practiced safe stair technique.  Pt reviewed HEP and asked questions however did not feel she needed to perform prior to d/c (has hx of Lt TKA).  Pt reports understanding and eager for d/c home today.    Recommendations for follow up therapy are one component of a multi-disciplinary discharge planning process, led by the attending physician.  Recommendations may be updated based on patient status, additional functional criteria and insurance authorization.  Follow Up Recommendations  Follow physician's recommendations for discharge plan and follow up therapies     Assistance Recommended at Discharge    Patient can return home with the following Assist for transportation;Help with stairs or ramp for entrance   Equipment Recommendations  None recommended by PT    Recommendations for Other Services       Precautions / Restrictions Precautions Precautions: Knee Restrictions Weight Bearing Restrictions: No RLE Weight Bearing: Weight bearing as tolerated     Mobility  Bed Mobility Overal bed mobility: Needs Assistance Bed Mobility: Supine to Sit     Supine to sit: Supervision          Transfers Overall transfer level: Needs assistance Equipment used: Rolling walker (2 wheels)   Sit to Stand: Min guard, Supervision           General transfer comment: verbal cue for UE and LE positioning for pain control    Ambulation/Gait Ambulation/Gait assistance: Min guard, Supervision Gait Distance (Feet): 200 Feet Assistive device: Rolling walker (2 wheels) Gait Pattern/deviations: Step-to pattern, Decreased stance time - right, Antalgic, Step-through pattern       General Gait Details:  initial cues for sequence and RW position   Stairs Stairs: Yes Stairs assistance: Min guard Stair Management: Step to pattern, Forwards, One rail Left, With cane Number of Stairs: 3 General stair comments: verbal cues for sequence and safety; performed twice, reports understanding, declined handout on technique   Wheelchair Mobility    Modified Rankin (Stroke Patients Only)       Balance                                            Cognition Arousal/Alertness: Awake/alert Behavior During Therapy: WFL for tasks assessed/performed Overall Cognitive Status: Within Functional Limits for tasks assessed                                          Exercises      General Comments        Pertinent Vitals/Pain Pain Assessment Pain Assessment: 0-10 Pain Score: 6  Pain Location: R knee Pain Descriptors / Indicators: Aching, Grimacing, Sore Pain Intervention(s): Monitored during session, Repositioned, Premedicated before session, Ice applied    Home Living                          Prior Function            PT Goals (current goals can now be found in the care  plan section) Progress towards PT goals: Progressing toward goals    Frequency    7X/week      PT Plan Current plan remains appropriate    Co-evaluation              AM-PAC PT "6 Clicks" Mobility   Outcome Measure  Help needed turning from your back to your side while in a flat bed without using bedrails?: A Little Help needed moving from lying on your back to sitting on the side of a flat bed without using bedrails?: A Little Help needed moving to and from a bed to a chair (including a wheelchair)?: A Little Help needed standing up from a chair using your arms (e.g., wheelchair or bedside chair)?: A Little Help needed to walk in hospital room?: A Little Help needed climbing 3-5 steps with a railing? : A Little 6 Click Score: 18    End of Session  Equipment Utilized During Treatment: Gait belt Activity Tolerance: Patient tolerated treatment well Patient left: in bed;with call bell/phone within reach Nurse Communication: Mobility status PT Visit Diagnosis: Difficulty in walking, not elsewhere classified (R26.2)     Time: 5053-9767 PT Time Calculation (min) (ACUTE ONLY): 20 min  Charges:  $Gait Training: 8-22 mins                     Paulino Door, DPT Physical Therapist Acute Rehabilitation Services Preferred contact method: Secure Chat Weekend Pager Only: [phone] Office: [phone]    Janan Halter [place] 02/24/2022, 11:45 AM

## 2022-03-09 ENCOUNTER — Other Ambulatory Visit: Payer: Self-pay | Admitting: Medical

## 2022-03-09 ENCOUNTER — Telehealth: Payer: Self-pay | Admitting: Medical

## 2022-03-09 MED ORDER — VALSARTAN 80 MG PO TABS: 80.0000 mg | ORAL_TABLET | Freq: Every day | ORAL | 0 refills | Status: DC

## 2022-03-09 NOTE — Telephone Encounter (Signed)
Please call re BP meds, even with taking 1/2 pill and it is still making her sick She has to take aspirin twice a day due to recent surgery and it taking it with that

## 2022-03-09 NOTE — Telephone Encounter (Signed)
Pt informed and wcb to schedule follow up appt

## 2022-03-29 ENCOUNTER — Other Ambulatory Visit: Payer: Self-pay | Admitting: Medical

## 2022-03-29 ENCOUNTER — Telehealth: Payer: Self-pay | Admitting: Medical

## 2022-03-29 MED ORDER — ERYTHROMYCIN 5 MG/GM OP OINT: 1.0000 | TOPICAL_OINTMENT | Freq: Every day | OPHTHALMIC | 0 refills | Status: DC

## 2022-03-29 NOTE — Telephone Encounter (Signed)
Pt called meds you gave for stye not helping  After seeing that you gave rx in June, I called patient back to clarify  June she had stye Left eye and it eventually cleared up. Now she has stye Right eye and has had for 2 weeks using cream but not really helping  Please advise

## 2022-03-30 ENCOUNTER — Other Ambulatory Visit: Payer: Self-pay | Admitting: Medical

## 2022-03-30 MED ORDER — POLYMYXIN B-TRIMETHOPRIM 10000-0.1 UNIT/ML-% OP SOLN: 1.0000 [drp] | OPHTHALMIC | 0 refills | Status: DC

## 2022-03-30 NOTE — Progress Notes (Signed)
o

## 2022-03-30 NOTE — Telephone Encounter (Signed)
Pt did not want the same medication sent in. This one does not work.   She would like a different medication sent to [place]. Please.

## 2022-03-31 ENCOUNTER — Other Ambulatory Visit: Payer: Self-pay

## 2022-03-31 MED ORDER — POLYMYXIN B-TRIMETHOPRIM 10000-0.1 UNIT/ML-% OP SOLN: 1.0000 [drp] | OPHTHALMIC | 0 refills | Status: DC

## 2022-03-31 NOTE — Telephone Encounter (Signed)
Called pt reached voice mail advised different med called in

## 2022-05-11 ENCOUNTER — Other Ambulatory Visit: Payer: Self-pay | Admitting: Medical

## 2022-05-19 ENCOUNTER — Encounter: Payer: Managed Care, Other (non HMO) | Admitting: Medical

## 2022-05-26 ENCOUNTER — Encounter: Payer: Self-pay | Admitting: Medical

## 2022-05-26 ENCOUNTER — Ambulatory Visit (INDEPENDENT_AMBULATORY_CARE_PROVIDER_SITE_OTHER): Payer: Managed Care, Other (non HMO) | Admitting: Medical

## 2022-05-26 VITALS — BP 132/80 | HR 80 | Temp 98.5°F | Wt 219.3 lb

## 2022-05-26 DIAGNOSIS — Z720 Tobacco use: Secondary | ICD-10-CM

## 2022-05-26 DIAGNOSIS — I10 Essential (primary) hypertension: Secondary | ICD-10-CM | POA: Diagnosis not present

## 2022-05-26 DIAGNOSIS — H00011 Hordeolum externum right upper eyelid: Secondary | ICD-10-CM

## 2022-05-26 MED ORDER — VALSARTAN 80 MG PO TABS: ORAL_TABLET | ORAL | 1 refills | Status: DC

## 2022-05-26 NOTE — Progress Notes (Signed)
Subjective:  Kathryn Delgado is a 56 y.o. female who presents for Chief Complaint  Patient presents with   Other    Med check and BP f/u, stye on rt. Eye, the medicine you gave her for her stye did not work, she said it came to a head the other day.      Hypertension - compliant with diovan . Here for recheck on BP.   Last visit we changed off ACE as BP wasn't at goal   she also notes she had been dealing with a lot of stress in recent months.  Not checking BP at home.    Exercise - rides bike some.  Just had surgery on leg so limited exercise currently.   Active on the job, courier for [employer]  She is a smoker, but someday smoker, not daily.   Still dealing with stye despite multiple rounds of ointment to eye.  No other aggravating or relieving factors.    No other c/o.  Past Medical History:  Diagnosis Date   Alcohol use    Arthritis    self report   Gallstones 05/08/2018   Found on [place]   Gastroesophageal reflux disease 12/20/2018   Headache    Hyperlipidemia    Hypertension    Liver lesion 12/06/2019   Current Outpatient Medications on File Prior to Visit  Medication Sig Dispense Refill   tiZANidine (ZANAFLEX) 4 MG tablet Take 1 tablet (4 mg total) by mouth every 6 (six) hours as needed for muscle spasms. 40 tablet 1   erythromycin ophthalmic ointment Place 1 Application into both eyes at bedtime. (Patient not taking: Reported on 05/26/2022) 3.5 g 0   trimethoprim-polymyxin b (POLYTRIM) ophthalmic solution Place 1 drop into both eyes every 4 (four) hours. (Patient not taking: Reported on 05/26/2022) 10 mL 0   No current facility-administered medications on file prior to visit.     The following portions of the patient's history were reviewed and updated as appropriate: allergies, current medications, past family history, past medical history, past social history, past surgical history and problem list.  ROS Otherwise as in subjective above    Objective: BP 132/80    Pulse 80   Temp 98.5 F (36.9 C)   Wt 219 lb 4.8 oz (99.5 kg)   BMI 34.35 kg/m   Wt Readings from Last 3 Encounters:  05/26/22 219 lb 4.8 oz (99.5 kg)  02/23/22 238 lb 12.8 oz (108.3 kg)  02/12/22 238 lb 12.8 oz (108.3 kg)   BP Readings from Last 3 Encounters:  05/26/22 132/80  02/24/22 (!) 178/97  02/12/22 (!) 146/90   General appearance: alert, no distress, well developed, well nourished Right lateral upper eyelid with ongoing stye Heart: RRR, normal S1, S2, no murmurs Lungs: CTA bilaterally, no wheezes, rhonchi, or rales Pulses: 2+ radial pulses, 2+ pedal pulses, normal cap refill Ext: no edema   Assessment: Encounter Diagnoses  Name Primary?   Essential hypertension Yes   Hordeolum externum of right upper eyelid    Current occasional smoker      Plan: Hypertension-much improved on valsartan.  Continue current medication.  I recommend she check her blood pressures at home.  She has a cuff but is not checking her pressures.  We discussed the need for healthy diet, stop smoking completely  Someday smoker-advised complete cessation  Stye-I recommend she call her eye doctor for an appointment of the stye is not resolving despite warm compresses and antibiotic ointment   [name] was seen today  for other.  Diagnoses and all orders for this visit:  Essential hypertension  Hordeolum externum of right upper eyelid  Current occasional smoker  Other orders -     valsartan (DIOVAN) 80 MG tablet; TAKE 1 TABLET(80 MG) BY MOUTH DAILY   Follow up: March 2024 for well visit

## 2022-06-05 ENCOUNTER — Other Ambulatory Visit: Payer: Self-pay | Admitting: Medical

## 2022-09-22 ENCOUNTER — Other Ambulatory Visit: Payer: Self-pay | Admitting: Medical

## 2022-12-29 IMAGING — DX DG CHEST 2V
2 series · 2 of 2 positions shown · non-contrast
Comparison: None.

CLINICAL DATA: Pre op eval for 55 y.o. outpatient scheduled for
knee replacement on [DATE].

EXAM:
CHEST - 2 VIEW

[chest pa]
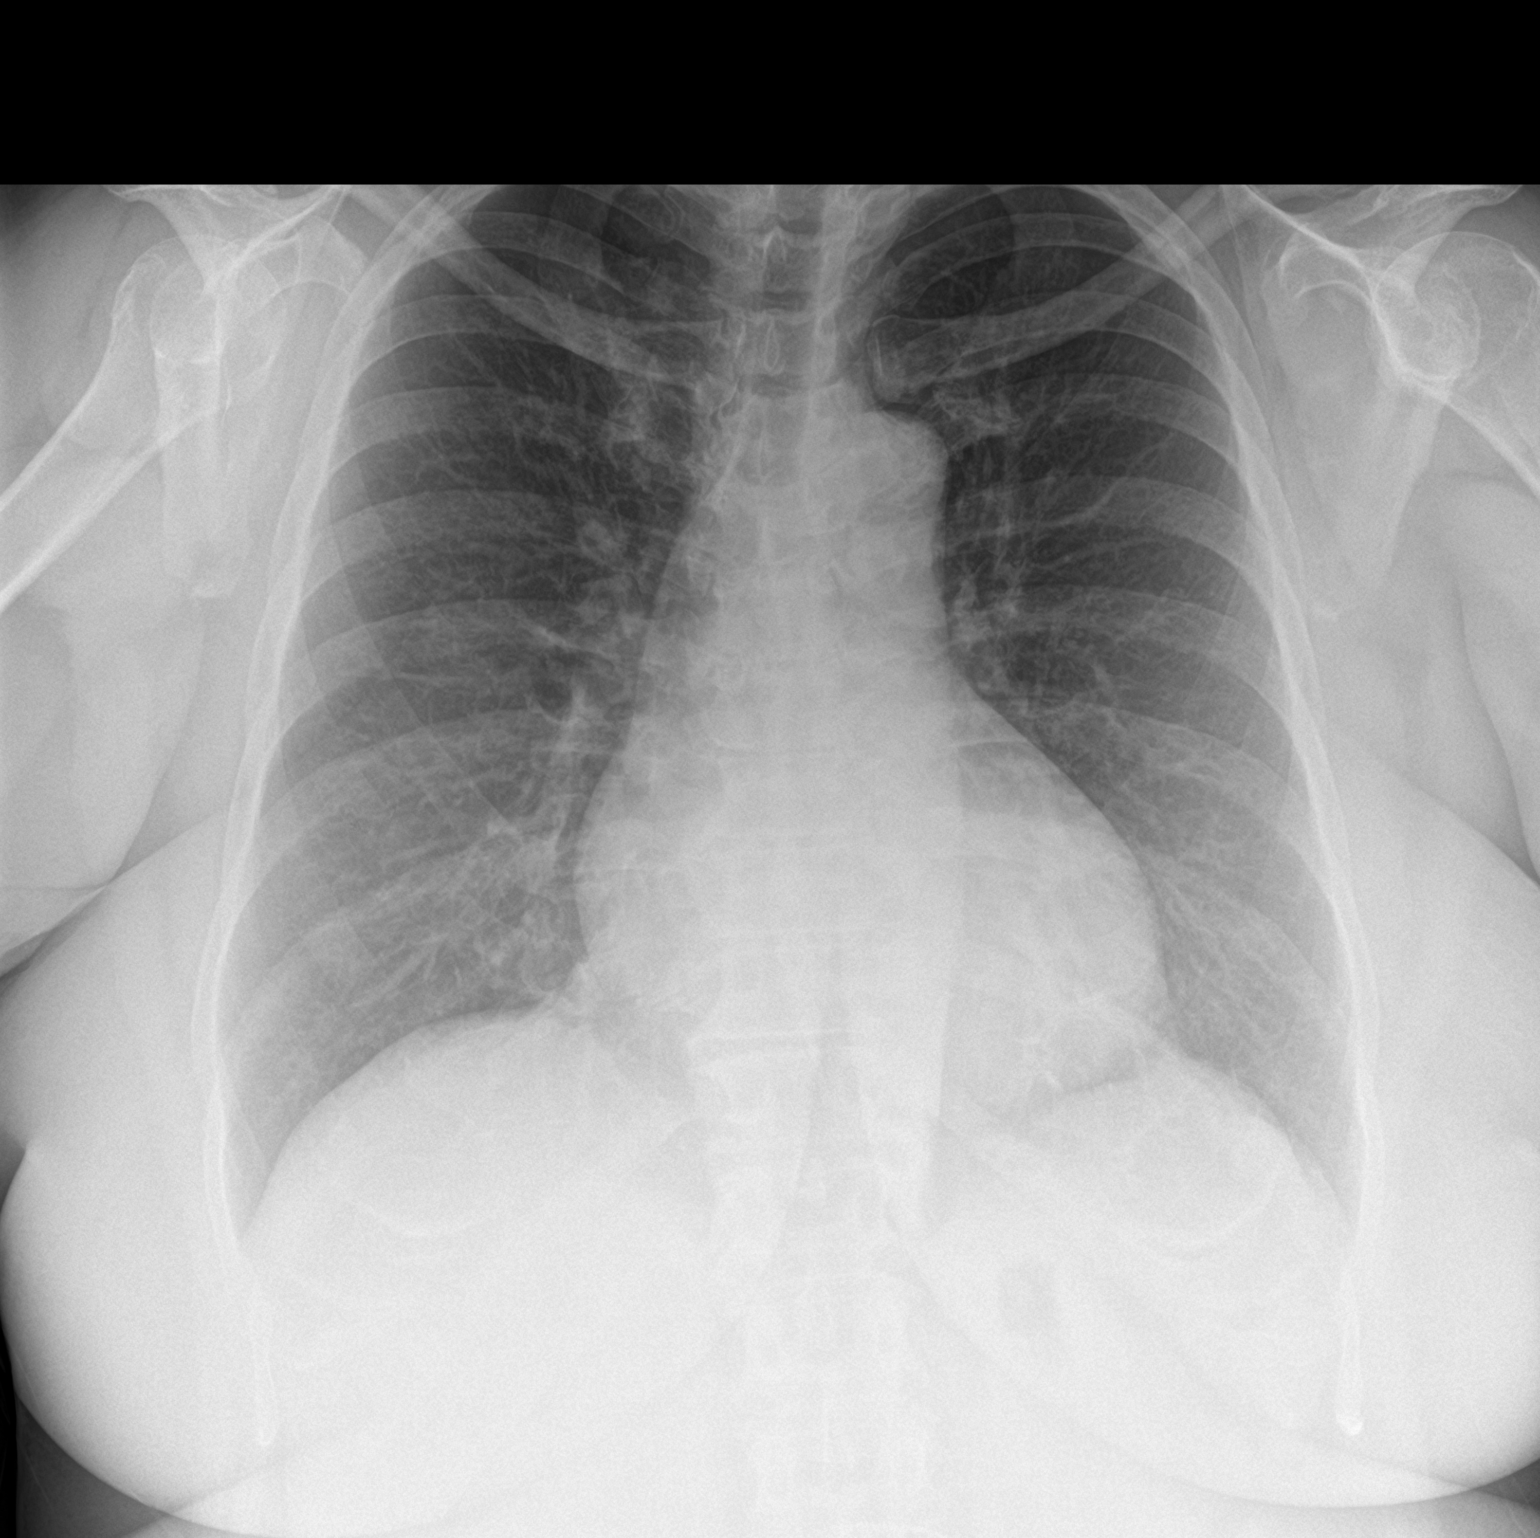

[chest lat]
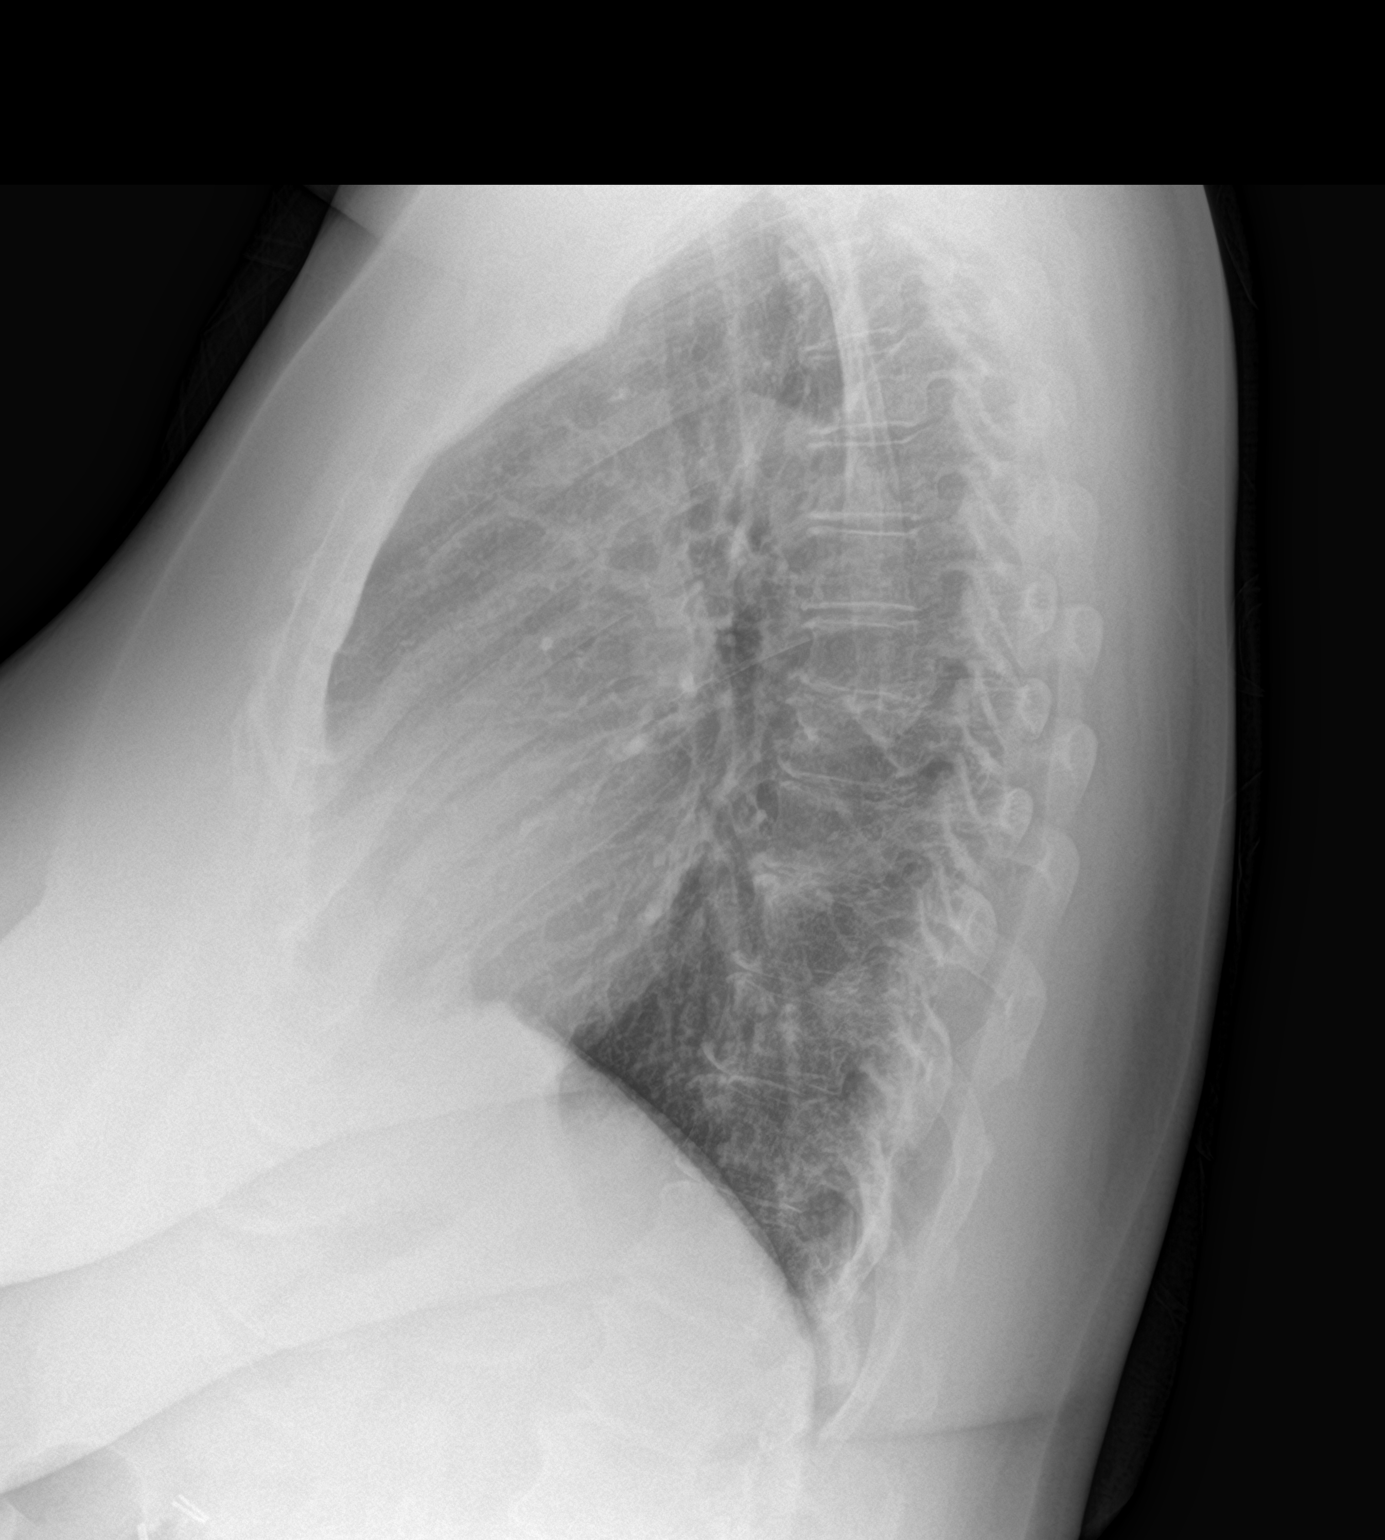

[2 of 2 positions shown; findings below may reference images not displayed]

FINDINGS: The heart and mediastinal contours are within normal limits.

No focal consolidation. No pulmonary edema. No pleural effusion. No
pneumothorax.

No acute osseous abnormality.
IMPRESSION: No active cardiopulmonary disease.

## 2023-05-13 ENCOUNTER — Ambulatory Visit: Payer: Managed Care, Other (non HMO) | Admitting: Medical

## 2023-05-13 VITALS — BP 122/70 | HR 84 | Temp 97.0°F | Wt 225.2 lb

## 2023-05-13 DIAGNOSIS — M79671 Pain in right foot: Secondary | ICD-10-CM

## 2023-05-13 DIAGNOSIS — Z23 Encounter for immunization: Secondary | ICD-10-CM | POA: Diagnosis not present

## 2023-05-13 DIAGNOSIS — I10 Essential (primary) hypertension: Secondary | ICD-10-CM | POA: Diagnosis not present

## 2023-05-13 DIAGNOSIS — E782 Mixed hyperlipidemia: Secondary | ICD-10-CM | POA: Diagnosis not present

## 2023-05-13 DIAGNOSIS — Z5320 Procedure and treatment not carried out because of patient's decision for unspecified reasons: Secondary | ICD-10-CM

## 2023-05-13 DIAGNOSIS — I700 Atherosclerosis of aorta: Secondary | ICD-10-CM

## 2023-05-13 DIAGNOSIS — R7301 Impaired fasting glucose: Secondary | ICD-10-CM

## 2023-05-13 NOTE — Progress Notes (Signed)
Subjective:  Kathryn Delgado is a 57 y.o. female who presents for Chief Complaint  Patient presents with   knot on top of right foot    Knot on top of right foot, x for a couple months but getting worse, hurts to walk, some redness, swelling     Here for painful area on the top of the right foot.  She has had a knot on the right foot but seems to be worsening pain lately.  Sometimes it is a little swollen or red.  Hurts so bad she missed work the last 2 days and today.  Hurts to even put the shoe on.  No history of gout.  No injury or trauma or fall.  Hypertension-compliant with medication  History of hyperlipidemia, not agreeable to medicine in the past  She also has not been agreeable to colon cancer screening, breast cancer screening or Pap smear  No other aggravating or relieving factors.    No other c/o.  Past Medical History:  Diagnosis Date   Alcohol use    Arthritis    self report   Gallstones 05/08/2018   Found on [place]   Gastroesophageal reflux disease 12/20/2018   Headache    Hyperlipidemia    Hypertension    Liver lesion 12/06/2019   Current Outpatient Medications on File Prior to Visit  Medication Sig Dispense Refill   valsartan (DIOVAN) 80 MG tablet TAKE 1 TABLET(80 MG) BY MOUTH DAILY 90 tablet 1   No current facility-administered medications on file prior to visit.     The following portions of the patient's history were reviewed and updated as appropriate: allergies, current medications, past family history, past medical history, past social history, past surgical history and problem list.  ROS Otherwise as in subjective above  Objective: BP 122/70   Pulse 84   Temp (!) 97 F (36.1 C)   Wt 225 lb 3.2 oz (102.2 kg)   BMI 35.27 kg/m   General appearance: alert, no distress, well developed, well nourished Neck: supple, no lymphadenopathy, no thyromegaly, no masses Heart: RRR, normal S1, S2, no murmurs Lungs: CTA bilaterally, no wheezes, rhonchi, or  rales Pulses: 2+ radial pulses, 2+ pedal pulses, normal cap refill Ext: no edema MSK: Right foot dorsal surface around the base of the second and third toe metatarsal versus distal portion of navicular with a bony raised area that is tender.  No fluctuance, no obvious cyst.  Rest of foot nontender without obvious deformity.  Foot and toes with normal range of motion Foot neurovascularly intact     Assessment: Encounter Diagnoses  Name Primary?   Right foot pain Yes   Needs flu shot    COVID-19 vaccine administered    Mixed hyperlipidemia    Essential hypertension    Elevated fasting blood sugar    Aortic atherosclerosis (HCC)    Colonoscopy refused    Mammogram declined      Plan: Foot pain-likely bony arthritic change of the navicular or the base of the third metatarsal.  Advised this weekend to use cold therapy such as bucket of cold water 15 minutes 3 times a day along with ibuprofen over-the-counter 10 mg tablet, 3 tablets or 6 mg twice a day through the weekend and rest.  Referral to podiatry as well  We discussed screenings, well visit.  She declines cancer screenings in general and declines a well visit at this time.  Hypertension-continue current medication, routine labs today  Impaired glucose a year ago-updated labs today  fasting  History of hyperlipidemia and aortic atherosclerosis-not agreeable to statin or medication prior.  Updated labs today.  Counseled on the influenza virus vaccine.  Vaccine information sheet given.  Influenza vaccine given after consent obtained.  Counseled on the Covid virus vaccine.  Vaccine information sheet given.  Covid vaccine given after consent obtained.   Kathryn Delgado was seen today for knot on top of right foot.  Diagnoses and all orders for this visit:  Right foot pain -     Cancel: DG Foot Complete Right; Future -     Ambulatory referral to Podiatry  Needs flu shot -     Flu vaccine trivalent PF, 6mos and  older(Flulaval,Afluria,Fluarix,Fluzone)  COVID-19 vaccine administered -     Pfizer Comirnaty Covid -19 Vaccine 61yrs and older  Mixed hyperlipidemia -     Lipid panel -     Comprehensive metabolic panel -     CBC  Essential hypertension -     Comprehensive metabolic panel -     CBC  Elevated fasting blood sugar -     Hemoglobin A1c  Aortic atherosclerosis (HCC)  Colonoscopy refused  Mammogram declined    Follow up: pending labs

## 2023-05-14 LAB — COMPREHENSIVE METABOLIC PANEL
ALT: 28 IU/L (ref 0–32)
AST: 22 IU/L (ref 0–40)
Albumin: 4.1 g/dL (ref 3.8–4.9)
Alkaline Phosphatase: 89 IU/L (ref 44–121)
BUN/Creatinine Ratio: 14 (ref 9–23)
BUN: 8 mg/dL (ref 6–24)
Bilirubin Total: 0.5 mg/dL (ref 0.0–1.2)
CO2: 20 mmol/L (ref 20–29)
Calcium: 9.1 mg/dL (ref 8.7–10.2)
Chloride: 109 mmol/L — ABNORMAL HIGH (ref 96–106)
Creatinine, Ser: 0.56 mg/dL — ABNORMAL LOW (ref 0.57–1.00)
Globulin, Total: 2.9 g/dL (ref 1.5–4.5)
Glucose: 108 mg/dL — ABNORMAL HIGH (ref 70–99)
Potassium: 4.4 mmol/L (ref 3.5–5.2)
Sodium: 141 mmol/L (ref 134–144)
Total Protein: 7 g/dL (ref 6.0–8.5)
eGFR: 106 mL/min/{1.73_m2} (ref >59)

## 2023-05-14 LAB — LIPID PANEL
Cholesterol, Total: 161 mg/dL (ref 100–199)
HDL: 52 mg/dL (ref >39)
LDL CALC COMMENT:: 3.1 ratio (ref 0.0–4.4)
LDL Chol Calc (NIH): 97 mg/dL (ref 0–99)
Triglycerides: 61 mg/dL (ref 0–149)
VLDL Cholesterol Cal: 12 mg/dL (ref 5–40)

## 2023-05-14 LAB — CBC
Hematocrit: 43 % (ref 34.0–46.6)
Hemoglobin: 13.6 g/dL (ref 11.1–15.9)
MCH: 28.1 pg (ref 26.6–33.0)
MCHC: 31.6 g/dL (ref 31.5–35.7)
MCV: 89 fL (ref 79–97)
Platelets: 207 10*3/uL (ref 150–450)
RBC: 4.84 x10E6/uL (ref 3.77–5.28)
RDW: 12.9 % (ref 11.7–15.4)
WBC: 4.9 10*3/uL (ref 3.4–10.8)

## 2023-05-14 LAB — HEMOGLOBIN A1C
Est. average glucose Bld gHb Est-mCnc: 117 mg/dL
Hgb A1c MFr Bld: 5.7 % — ABNORMAL HIGH (ref 4.8–5.6)

## 2023-05-15 ENCOUNTER — Other Ambulatory Visit: Payer: Self-pay | Admitting: Medical

## 2023-05-15 MED ORDER — VALSARTAN 80 MG PO TABS: ORAL_TABLET | ORAL | 3 refills | Status: DC

## 2023-05-15 NOTE — Progress Notes (Signed)
Results sent through MyChart

## 2023-06-10 ENCOUNTER — Ambulatory Visit (INDEPENDENT_AMBULATORY_CARE_PROVIDER_SITE_OTHER): Payer: Managed Care, Other (non HMO)

## 2023-06-10 ENCOUNTER — Encounter: Payer: Self-pay | Admitting: Podiatry

## 2023-06-10 ENCOUNTER — Ambulatory Visit: Payer: Managed Care, Other (non HMO) | Admitting: Podiatry

## 2023-06-10 DIAGNOSIS — M19071 Primary osteoarthritis, right ankle and foot: Secondary | ICD-10-CM | POA: Diagnosis not present

## 2023-06-10 DIAGNOSIS — M778 Other enthesopathies, not elsewhere classified: Secondary | ICD-10-CM | POA: Diagnosis not present

## 2023-06-10 MED ORDER — TRIAMCINOLONE ACETONIDE 10 MG/ML IJ SUSP
5.0000 mg | Freq: Once | INTRAMUSCULAR | Status: AC
  Administered 2023-06-10: 5 mg

## 2023-06-10 MED ORDER — MELOXICAM 15 MG PO TABS: 15.0000 mg | ORAL_TABLET | Freq: Every day | ORAL | 0 refills | Status: DC

## 2023-06-10 NOTE — Patient Instructions (Signed)
 Recommend use of a good stiff soled shoe Recommend trying shoe market for appropriate fitting and good shoe recommendations.  There are examples of good shoes with very shoe makers, some brands that I find consistently offered issues include Brooks, [employer]'s, ASICS, new balance. Look for a shoe that limits the amount of rear foot motion patient undergo. Try lacing her shoes in a way to prevent extra pressure where you have the midfoot swelling.   Take the meloxicam  once a day with food.  May take consistently for 2 to 3 weeks, after that take as needed.

## 2023-06-10 NOTE — Progress Notes (Signed)
 Chief Complaint  Patient presents with   Foot Pain    RT. Ongoing for about 6 months. Top of foot. Sharp pains, intermittent. Today there is no pain. Some swelling at times. No known injuries. Uses ice, heat , elevation, compression which all helps some.     HPI: 58 y.o. female presents today with slowly worsening right foot pain ongoing for about the 6 months.  Worse with activity and with excessive motion.  She does work on her feet and certain shoes seem to aggravate it more.  Endorses sharp throbbing intermittent pain to the top of the foot when aggravated.  Past Medical History:  Diagnosis Date   Alcohol use    Arthritis    self report   Gallstones 05/08/2018   Found on US    Gastroesophageal reflux disease 12/20/2018   Headache    Hyperlipidemia    Hypertension    Liver lesion 12/06/2019    Past Surgical History:  Procedure Laterality Date   CHOLECYSTECTOMY     FOOT SURGERY  2005   FOOT SURGERY Left 2009   KNEE ARTHROCENTESIS  08/2018   TOTAL KNEE ARTHROPLASTY Left 09/01/2021   Procedure: LEFT TOTAL KNEE ARTHROPLASTY;  Surgeon: Sheril Coy, MD;  Location: [facility];  Service: Orthopedics;  Laterality: Left;   TOTAL KNEE ARTHROPLASTY Right 02/23/2022   Procedure: RIGHT TOTAL KNEE ARTHROPLASTY;  Surgeon: Sheril Coy, MD;  Location: [facility];  Service: Orthopedics;  Laterality: Right;   WISDOM TOOTH EXTRACTION      No Known Allergies  ROS denies any nausea, vomiting, fever, chills, chest pain, shortness of breath.   Physical Exam: There were no vitals filed for this visit.  General: The patient is alert and oriented x3 in no acute distress.  Dermatology: Skin is warm, dry and supple bilateral lower extremities. Interspaces are clear of maceration and debris.    Vascular: Palpable pedal pulses bilaterally. Capillary refill within normal limits.  No diffuse appreciable edema.  No erythema or calor.  Neurological: Light touch sensation grossly intact  bilateral feet.   Musculoskeletal Exam: Right foot midfoot spurring present at the dorsal first TMT J with localized edema and inflammation here.  Pain on palpation here and with range of motion.  Left strength 5/5 for all major muscle groups.  Radiographic Exam: 06/10/2023 right foot Normal osseous mineralization.  Arthritic changes to first TMT J with spurring present.  Mild spurring of talonavicular joint present.  Some arthritic changes noted to [place] joint.  Assessment/Plan of Care: 1. Capsulitis of foot, right   2. Arthritis of right midfoot      Meds ordered this encounter  Medications   triamcinolone  acetonide (KENALOG ) 10 MG/ML injection 5 mg   meloxicam  (MOBIC ) 15 MG tablet    Sig: Take 1 tablet (15 mg total) by mouth daily.    Dispense:  30 tablet    Refill:  0   None  Discussed clinical findings with patient today.  Plan: - Verbal consent obtained perform corticosteroid injection to the right dorsal midfoot after explanation of risk, benefits and alternative therapies.  Alcohol skin prep.  Periarticular space of right TMT J was injected using 5 mg Kenalog  mixed with 0.5 cc of 0.5% Marcaine  plain, 0.5 cc of 1% lidocaine  plain.  Patient tolerated this well.  Band-Aid applied. - Discussed use of stiff soled shoes to limit motion at the affected site - Advised avoiding tight fitting shoes over dorsal midfoot or using alternative shoe lacing techniques  to avoid extra pressure -Discussed use of RICE therapy. -Starting patient on course of oral meloxicam  -Patient would like to continue with conservative therapy for now.  Follow-up as needed   Zonnie Landen L. Lamount MAUL, AACFAS [facility]     [address] [place], [place] [address]                Office [phone]  Fax [phone]

## 2023-07-04 ENCOUNTER — Ambulatory Visit: Payer: Managed Care, Other (non HMO) | Admitting: Podiatry

## 2023-07-08 ENCOUNTER — Other Ambulatory Visit: Payer: Self-pay | Admitting: Podiatry

## 2023-07-08 DIAGNOSIS — M19071 Primary osteoarthritis, right ankle and foot: Secondary | ICD-10-CM

## 2023-08-10 ENCOUNTER — Other Ambulatory Visit: Payer: Self-pay | Admitting: Podiatry

## 2023-08-10 DIAGNOSIS — M19071 Primary osteoarthritis, right ankle and foot: Secondary | ICD-10-CM

## 2024-01-21 ENCOUNTER — Other Ambulatory Visit: Payer: Self-pay

## 2024-01-21 ENCOUNTER — Ambulatory Visit (INDEPENDENT_AMBULATORY_CARE_PROVIDER_SITE_OTHER)

## 2024-01-21 ENCOUNTER — Encounter (HOSPITAL_COMMUNITY): Payer: Self-pay | Admitting: *Deleted

## 2024-01-21 ENCOUNTER — Ambulatory Visit (HOSPITAL_COMMUNITY)
Admission: EM | Admit: 2024-01-21 | Discharge: 2024-01-21 | Disposition: A | Discharge status: Home / Self Care | Attending: Family Medicine | Admitting: Family Medicine

## 2024-01-21 DIAGNOSIS — M79671 Pain in right foot: Secondary | ICD-10-CM | POA: Diagnosis not present

## 2024-01-21 MED ORDER — PREDNISONE 20 MG PO TABS: 40.0000 mg | ORAL_TABLET | Freq: Every day | ORAL | 0 refills | Status: AC

## 2024-01-21 MED ORDER — KETOROLAC TROMETHAMINE 30 MG/ML IJ SOLN
INTRAMUSCULAR | Status: AC
  Filled 2024-01-21: qty 1

## 2024-01-21 MED ORDER — KETOROLAC TROMETHAMINE 30 MG/ML IJ SOLN
30.0000 mg | Freq: Once | INTRAMUSCULAR | Status: AC
  Administered 2024-01-21: 30 mg via INTRAMUSCULAR

## 2024-01-21 NOTE — ED Triage Notes (Signed)
 PT reports RT foot pain on top. PT has had pain before and had a shot for pain. Pain was worse last night and hurts to walk or stand today. PT took ibuprofen  and BC with out relief.

## 2024-01-21 NOTE — Discharge Instructions (Signed)
 By my review, there are no broken bones on your x-rays.  I do see signs of arthritis. The radiologist will also read your x-ray, and if their interpretation differs significantly from mine, and the management of your condition would change, we will call you.  You have been given a shot of Toradol  30 mg today.  Take prednisone  20 mg--2 daily for 5 days While you are taking the prednisone  take Tylenol /acetaminophen  500 mg--2 every 6 hours as needed for pain.  Then you can restart taking other anti-inflammatories after you are done with the prednisone .  Please make an appointment with the podiatrist

## 2024-01-21 NOTE — ED Provider Notes (Signed)
 [facility]    CSN: [id number] Arrival date & time: 01/21/24  1153      History   Chief Complaint Chief Complaint  Patient presents with   Foot Pain    HPI Nhu Glasby is a 58 y.o. female.    Foot Pain  Here for pain in her right foot at the dorsum of the midfoot. It began bothering her about 2 weeks ago.  No trauma or fall  She has seen podiatry and had an injection for similar trouble in 1/3, which she states was helpful. It has been discolored over that part of her foot for awhile.  NKDA.  Is postmenopausal.  Last eGFR was 106 in 05/2023.  Past Medical History:  Diagnosis Date   Alcohol use    Arthritis    self report   Gallstones 05/08/2018   Found on US    Gastroesophageal reflux disease 12/20/2018   Headache    Hyperlipidemia    Hypertension    Liver lesion 12/06/2019    Patient Active Problem List   Diagnosis Date Noted   Right foot pain 05/13/2023   Colonoscopy refused 05/13/2023   Mammogram declined 05/13/2023   Primary localized osteoarthritis of right knee 02/23/2022   S/P total knee arthroplasty, right 02/23/2022   Primary osteoarthritis of left knee 09/01/2021   S/P total knee arthroplasty, left 09/01/2021   Elevated fasting blood sugar 08/20/2021   Mixed hyperlipidemia 08/18/2021   GERD without esophagitis 08/18/2021   Family history of diabetes mellitus (DM) 08/18/2021   Body mass index (BMI) of 39.0 to 39.9 in adult 08/18/2021   Acute pain of both shoulders 12/16/2020   Shoulder weakness 12/16/2020   Pain in both wrists 12/16/2020   Swelling of left wrist 12/16/2020   Pain in finger of both hands 12/16/2020   Chronic knee pain after total replacement of left knee joint 07/01/2020   10 year risk of MI or stroke < 7.5% 12/08/2019   Liver lesion 12/06/2019   Aortic atherosclerosis (HCC) 12/06/2019   Alcohol consumption heavy 12/20/2018   Gallstones 05/08/2018   Essential hypertension 04/25/2018   Chronic pain of left  knee 04/25/2018   Elevated ALT measurement 04/25/2018   Former smoker 04/25/2018    Past Surgical History:  Procedure Laterality Date   CHOLECYSTECTOMY     FOOT SURGERY  2005   FOOT SURGERY Left 2009   KNEE ARTHROCENTESIS  08/2018   TOTAL KNEE ARTHROPLASTY Left 09/01/2021   Procedure: LEFT TOTAL KNEE ARTHROPLASTY;  Surgeon: Sheril Coy, MD;  Location: [facility];  Service: Orthopedics;  Laterality: Left;   TOTAL KNEE ARTHROPLASTY Right 02/23/2022   Procedure: RIGHT TOTAL KNEE ARTHROPLASTY;  Surgeon: Sheril Coy, MD;  Location: [facility];  Service: Orthopedics;  Laterality: Right;   WISDOM TOOTH EXTRACTION      OB History   No obstetric history on file.      Home Medications    Prior to Admission medications   Medication Sig Start Date End Date Taking? Authorizing Provider  predniSONE  (DELTASONE ) 20 MG tablet Take 2 tablets (40 mg total) by mouth daily with breakfast for 5 days. 01/21/24 01/26/24 Yes Vonna Sharlet POUR, MD  valsartan  (DIOVAN ) 80 MG tablet TAKE 1 TABLET(80 MG) BY MOUTH DAILY 05/15/23  Yes Tysinger, Alm RAMAN, PA-C    Family History Family History  Problem Relation Age of Onset   Diabetes Mother    Thyroid disease Sister    Diabetes Sister    Thyroid disease Sister  Diabetes Brother    Colon polyps Neg Hx    Colon cancer Neg Hx    Esophageal cancer Neg Hx    Rectal cancer Neg Hx    Stomach cancer Neg Hx     Social History Social History   Tobacco Use   Smoking status: Former    Current packs/day: 0.00    Average packs/day: 0.5 packs/day for 40.0 years (20.0 ttl pk-yrs)    Types: Cigarettes    Start date: 12/05/1981    Quit date: 12/05/2021    Years since quitting: 2.1   Smokeless tobacco: Never  Vaping Use   Vaping status: Never Used  Substance Use Topics   Alcohol use: Yes    Alcohol/week: 2.0 standard drinks of alcohol    Types: 2 Cans of beer per week    Comment: socially   Drug use: No    Comment: former cocaine user      Allergies    Patient has no known allergies.   Review of Systems Review of Systems   Physical Exam Triage Vital Signs ED Triage Vitals  Encounter Vitals Group     BP 01/21/24 1252 (!) 152/82     Girls Systolic BP Percentile --      Girls Diastolic BP Percentile --      Boys Systolic BP Percentile --      Boys Diastolic BP Percentile --      Pulse Rate 01/21/24 1252 78     Resp 01/21/24 1252 20     Temp 01/21/24 1252 97.9 F (36.6 C)     Temp src --      SpO2 01/21/24 1252 95 %     Weight --      Height --      Head Circumference --      Peak Flow --      Pain Score 01/21/24 1249 8     Pain Loc --      Pain Education --      Exclude from Growth Chart --    No data found.  Updated Vital Signs BP (!) 152/82   Pulse 78   Temp 97.9 F (36.6 C)   Resp 20   SpO2 95%   Visual Acuity Right Eye Distance:   Left Eye Distance:   Bilateral Distance:    Right Eye Near:   Left Eye Near:    Bilateral Near:     Physical Exam Vitals reviewed.  Constitutional:      General: She is not in acute distress.    Appearance: She is not ill-appearing, toxic-appearing or diaphoretic.  Musculoskeletal:     Comments: There is some puffiness and mild hyperpigmentation over dorsum of right midfoot, mildly tender. No deformity. Pulses normal  Skin:    Coloration: Skin is not jaundiced or pale.  Neurological:     Mental Status: She is alert.      UC Treatments / Results  Labs (all labs ordered are listed, but only abnormal results are displayed) Labs Reviewed - No data to display  EKG   Radiology No results found.  Procedures Procedures (including critical care time)  Medications Ordered in UC Medications  ketorolac  (TORADOL ) 30 MG/ML injection 30 mg (has no administration in time range)    Initial Impression / Assessment and Plan / UC Course  I have reviewed the triage vital signs and the nursing notes.  Pertinent labs & imaging results that were available during my care of  the patient were  reviewed by me and considered in my medical decision making (see chart for details).     By my review there are arthritic changes over the mid foot on the lateral and dorsal views.   Toradol  injection is given here, and 5 day course of prednisone  is sent to the pharmacy.  She will followup with her podiatrist. Final Clinical Impressions(s) / UC Diagnoses   Final diagnoses:  Foot pain, right     Discharge Instructions      By my review, there are no broken bones on your x-rays.  I do see signs of arthritis. The radiologist will also read your x-ray, and if their interpretation differs significantly from mine, and the management of your condition would change, we will call you.  You have been given a shot of Toradol  30 mg today.  Take prednisone  20 mg--2 daily for 5 days While you are taking the prednisone  take Tylenol /acetaminophen  500 mg--2 every 6 hours as needed for pain.  Then you can restart taking other anti-inflammatories after you are done with the prednisone .  Please make an appointment with the podiatrist     ED Prescriptions     Medication Sig Dispense Auth. Provider   predniSONE  (DELTASONE ) 20 MG tablet Take 2 tablets (40 mg total) by mouth daily with breakfast for 5 days. 10 tablet Vonna Birtie Fellman K, MD      PDMP not reviewed this encounter.   Vonna Sharlet POUR, MD 01/21/24 1341

## 2024-02-16 ENCOUNTER — Telehealth: Payer: Self-pay

## 2024-02-16 ENCOUNTER — Ambulatory Visit: Admitting: Medical

## 2024-02-16 VITALS — BP 142/80 | HR 87 | Wt 231.8 lb

## 2024-02-16 DIAGNOSIS — H1132 Conjunctival hemorrhage, left eye: Secondary | ICD-10-CM | POA: Diagnosis not present

## 2024-02-16 DIAGNOSIS — Z122 Encounter for screening for malignant neoplasm of respiratory organs: Secondary | ICD-10-CM

## 2024-02-16 DIAGNOSIS — I10 Essential (primary) hypertension: Secondary | ICD-10-CM

## 2024-02-16 DIAGNOSIS — Z23 Encounter for immunization: Secondary | ICD-10-CM

## 2024-02-16 DIAGNOSIS — R7301 Impaired fasting glucose: Secondary | ICD-10-CM

## 2024-02-16 DIAGNOSIS — F17200 Nicotine dependence, unspecified, uncomplicated: Secondary | ICD-10-CM | POA: Diagnosis not present

## 2024-02-16 DIAGNOSIS — N6342 Unspecified lump in left breast, subareolar: Secondary | ICD-10-CM

## 2024-02-16 DIAGNOSIS — Z1231 Encounter for screening mammogram for malignant neoplasm of breast: Secondary | ICD-10-CM | POA: Diagnosis not present

## 2024-02-16 MED ORDER — VALSARTAN 40 MG PO TABS: 40.0000 mg | ORAL_TABLET | Freq: Every day | ORAL | 3 refills | Status: DC

## 2024-02-16 NOTE — Progress Notes (Signed)
 Name: Kathryn Delgado   Date of Visit: 02/16/24   Date of last visit with me: Visit date not found   CHIEF COMPLAINT:  Chief Complaint  Patient presents with   Medical Management of Chronic Issues    Med check. Has a knot on left breast at areola area, has a red spot on left eye. Would like flu shot today       HPI:  Discussed the use of AI scribe software for clinical note transcription with the patient, who gave verbal consent to proceed.  History of Present Illness   A 58 year old female presents with a resolved knot on her left breast and recent redness in her left eye.  She had a 'little knot' on her left breast for about three weeks. It was hard, slightly sensitive to touch, but not painful. She frequently rubbed it, and it has since resolved.  She noticed redness in her left eye, described as being on the sclera. The redness was significant but painless, with no drainage, puffiness, or wateriness.  She occasionally feels lightheaded and has not been consistent with her blood pressure medication. She sometimes checks her blood pressure at home, which she reports as being okay recently. She also experiences seeing 'little spots and stars' occasionally.  She has a long history of smoking since the age of 50 and is currently trying to cut down. She smokes about half a pack a day but has reduced to approximately six cigarettes daily.  She has not seen an eye doctor in a while and has not had a mammogram recently due to confusion about the referral process.  Past Medical History:  Diagnosis Date   Alcohol use    Arthritis    self report   Gallstones 05/08/2018   Found on US    Gastroesophageal reflux disease 12/20/2018   Headache    Hyperlipidemia    Hypertension    Liver lesion 12/06/2019   No current outpatient medications on file prior to visit.   No current facility-administered medications on file prior to visit.   ROS as in subjective    Objective: BP (!)  142/80   Pulse 87   Wt 231 lb 12.8 oz (105.1 kg)   BMI 36.31 kg/m   Gen: wd, wn, nad Left with a resolving area of erythema superior to the iris consistent with resolving subconjunctival hemorrhage, she has pterygiums of both eyes laterally, otherwise PERRLA, EOMI, otherwise unremarkable Heart regular rate and rhythm, normal S1-S2, no murmurs Lungs clear Breast: No obvious abnormality, no nodule, no skin changes, no lymphadenopathy, exam chaperoned by nurse     Assessment: Encounter Diagnoses  Name Primary?   Subconjunctival hemorrhage of left eye Yes   Smoker    Screening for lung cancer    Screening mammogram for breast cancer    Essential hypertension    Elevated fasting blood sugar    Subareolar mass of left breast    Need for influenza vaccination     Plan Lump of left breast - I recommend diagnostic imaging mammogram of your breast -Please call your insurance to verify coverage for screening mammogram and diagnostic mammogram.  You need a diagnostic mammogram of the left breast.  Subconjunctival hemorrhage resolving/mostly resolved.  Reassured  Hypertension Hypertension previously poorly controlled, currently not medicated. Blood pressure normal today, but requires regular monitoring. - Encouraged regular eye exams for glaucoma screening and hypertensive damage assessment. - Restart your blood pressure medication, Valsartan  40mg  daily  Tobacco use disorder Long-term tobacco  use since age 2. Discussed lung cancer screening options due to smoking history. - Discuss lung cancer CT screening with insurance for coverage. - Check your insurance to see if they cover a CT lung cancer screening -continue efforts to quit smoking  General Health Maintenance Emphasized importance of regular cancer screenings including mammograms and colonoscopies. - Encourage scheduling a mammogram. - Discuss colonoscopy as part of routine cancer screening.  Elevated glucose   Prediabetes means you have a higher than normal blood sugar level. It's not high enough to be considered type 2 diabetes yet, but without making some lifestyle changes you are more likely to develop type 2 diabetes.  If you have prediabetes, the long-term damage of diabetes, especially to your heart, blood vessels and kidneys may already be starting. You may not be able to change certain risk factors such as age, race, or family history, but you CAN make changes to your lifestyle, your eating habits, and your activity.  Although diabetes can develop at any age, the risk of prediabetes increases after age 53.  Your risk of prediabetes increases if you have a parent or sibling with type 2 diabetes.   Although it's unclear why, certain people including Black, Hispanic, American [place] and [place] American people, are more likely to develop prediabetes.  Ways to prevent or slow progression to diabetes: Eat healthy foods - Eating red meat and processed meat, and drinking sugar-sweetened beverages, is associated with a higher risk of prediabetes. A diet high in fruits, vegetables, nuts, whole grains and olive oil is associated with a lower risk of prediabetes. Get at least 150 minutes of moderate aerobic physical activity a week, or about 30 minutes on most days of the week.  The less active you are, the greater your risk of prediabetes. Physical activity helps you control your weight, uses up sugar for energy and makes the body use insulin more effectively. Lose excess weight - Being overweight is a primary risk factor for prediabetes. The more fatty tissue you have, especially inside and between the muscle and skin around your abdomen, the more resistant your cells become to insulin. Waist size. A large waist size can indicate insulin resistance. The risk of insulin resistance goes up for men with waists larger than 40 inches and for women with waists larger than 35 inches. Control your blood pressure and  cholesterol.  If your blood pressure is not 130/80 or less, discuss with your provider to help get this under control.  It is ideal to have an HDL good cholesterol number >50 and have a LDL bad cholesterol number <100.   Don't smoke One simple strategy to help you make good food choices and eat appropriate portions sizes is to divide up your plate. These three divisions on your plate promote healthy eating:  One-half: fruit and nonstarchy vegetables One-quarter: whole grains One-quarter: protein-rich foods, such as legumes, fish or lean meats  Counseled on the influenza virus vaccine.  Vaccine information sheet given.  Influenza vaccine given after consent obtained.  Advised shingrix and pneumococcal vaccine.  Comfort was seen today for medical management of chronic issues.  Diagnoses and all orders for this visit:  Subconjunctival hemorrhage of left eye  Smoker -     CT CHEST LUNG CA SCREEN LOW DOSE W/O CM; Future  Screening for lung cancer -     CT CHEST LUNG CA SCREEN LOW DOSE W/O CM; Future  Screening mammogram for breast cancer -     MM 3D SCREENING MAMMOGRAM  UNILATERAL RIGHT BREAST; Future  Essential hypertension  Elevated fasting blood sugar  Subareolar mass of left breast -     MM 3D DIAGNOSTIC MAMMOGRAM UNILATERAL LEFT BREAST -     MM 3D SCREENING MAMMOGRAM UNILATERAL RIGHT BREAST; Future  Need for influenza vaccination -     Flu vaccine trivalent PF, 6mos and older(Flulaval,Afluria,Fluarix,Fluzone)  Other orders -     valsartan  (DIOVAN ) 40 MG tablet; Take 1 tablet (40 mg total) by mouth daily.    F/u pending mammogram

## 2024-02-16 NOTE — Patient Instructions (Addendum)
 Lump of left breast - I recommend diagnostic imaging mammogram of your breast -Please call your insurance to verify coverage for screening mammogram and diagnostic mammogram.  You need a diagnostic mammogram of the left breast.  Subconjunctival hemorrhage resolving/mostly resolved.  Reassured  Hypertension Hypertension previously poorly controlled, currently not medicated. Blood pressure normal today, but requires regular monitoring. - Encouraged regular eye exams for glaucoma screening and hypertensive damage assessment. - Restart your blood pressure medication, Valsartan  40mg  daily  Tobacco use disorder Long-term tobacco use since age 58. Discussed lung cancer screening options due to smoking history. - Discuss lung cancer CT screening with insurance for coverage. - Check your insurance to see if they cover a CT lung cancer screening -continue efforts to quit smoking  General Health Maintenance Emphasized importance of regular cancer screenings including mammograms and colonoscopies. - Encourage scheduling a mammogram. - Discuss colonoscopy as part of routine cancer screening.  Elevated glucose  Prediabetes means you have a higher than normal blood sugar level. It's not high enough to be considered type 2 diabetes yet, but without making some lifestyle changes you are more likely to develop type 2 diabetes.  If you have prediabetes, the long-term damage of diabetes, especially to your heart, blood vessels and kidneys may already be starting. You may not be able to change certain risk factors such as age, race, or family history, but you CAN make changes to your lifestyle, your eating habits, and your activity.  Although diabetes can develop at any age, the risk of prediabetes increases after age 59.  Your risk of prediabetes increases if you have a parent or sibling with type 2 diabetes.   Although it's unclear why, certain people including Black, Hispanic, American [place] and [place] American  people, are more likely to develop prediabetes.  Ways to prevent or slow progression to diabetes: Eat healthy foods - Eating red meat and processed meat, and drinking sugar-sweetened beverages, is associated with a higher risk of prediabetes. A diet high in fruits, vegetables, nuts, whole grains and olive oil is associated with a lower risk of prediabetes. Get at least 150 minutes of moderate aerobic physical activity a week, or about 30 minutes on most days of the week.  The less active you are, the greater your risk of prediabetes. Physical activity helps you control your weight, uses up sugar for energy and makes the body use insulin more effectively. Lose excess weight - Being overweight is a primary risk factor for prediabetes. The more fatty tissue you have, especially inside and between the muscle and skin around your abdomen, the more resistant your cells become to insulin. Waist size. A large waist size can indicate insulin resistance. The risk of insulin resistance goes up for men with waists larger than 40 inches and for women with waists larger than 35 inches. Control your blood pressure and cholesterol.  If your blood pressure is not 130/80 or less, discuss with your provider to help get this under control.  It is ideal to have an HDL good cholesterol number >50 and have a LDL bad cholesterol number <100.   Don't smoke One simple strategy to help you make good food choices and eat appropriate portions sizes is to divide up your plate. These three divisions on your plate promote healthy eating:  One-half: fruit and nonstarchy vegetables One-quarter: whole grains One-quarter: protein-rich foods, such as legumes, fish or lean meats    Advised shingrix and pneumococcal vaccine.

## 2024-02-16 NOTE — Telephone Encounter (Signed)
 Copied from CRM [id number]. Topic: Clinical - Request for Lab/Test Order >> Feb 16, 2024 11:13 AM Debby BROCKS wrote: Reason for CRM: Patient would like to send a message to Dr. Bulah to set up the Mammogram screenings that they talked about on their appointment this morning. She was advised to call back if she wanted them

## 2024-02-20 ENCOUNTER — Ambulatory Visit: Admitting: Medical

## 2024-02-21 ENCOUNTER — Other Ambulatory Visit: Payer: Self-pay | Admitting: Medical

## 2024-02-21 DIAGNOSIS — N6342 Unspecified lump in left breast, subareolar: Secondary | ICD-10-CM

## 2024-02-23 ENCOUNTER — Telehealth: Payer: Self-pay | Admitting: Medical

## 2024-02-23 NOTE — Telephone Encounter (Signed)
 Copied from CRM [id number]. Topic: Referral - Question >> Feb 23, 2024  8:40 AM Donna BRAVO wrote: Reason for CRM: Pre service center  [facility] [phone] [facility] [place] [phone]  Kylie stated the appt for CT, Read note in referral verbatim: Type Date User Summary Attachment General 02/22/2024  9:33 AM Junette Regan D Pt wrote in [place] that she did not request a lung cancer screening and is going to cancel it. - Note: Pt wrote in mychart that she did not request a lung cancer screening and is going to cancel it.   Kaylie stated someone in the office needs to call the patient to verify cancellation request and cancel the appt.

## 2024-02-26 ENCOUNTER — Ambulatory Visit (HOSPITAL_BASED_OUTPATIENT_CLINIC_OR_DEPARTMENT_OTHER)

## 2024-03-01 ENCOUNTER — Other Ambulatory Visit: Payer: Self-pay | Admitting: Medical

## 2024-03-01 ENCOUNTER — Ambulatory Visit
Admission: RE | Admit: 2024-03-01 | Discharge: 2024-03-01 | Disposition: A | Discharge status: Home / Self Care | Source: Ambulatory Visit | Attending: Medical | Admitting: Medical

## 2024-03-01 DIAGNOSIS — R921 Mammographic calcification found on diagnostic imaging of breast: Secondary | ICD-10-CM

## 2024-03-01 DIAGNOSIS — N6320 Unspecified lump in the left breast, unspecified quadrant: Secondary | ICD-10-CM

## 2024-03-01 DIAGNOSIS — N6342 Unspecified lump in left breast, subareolar: Secondary | ICD-10-CM

## 2024-03-01 DIAGNOSIS — R599 Enlarged lymph nodes, unspecified: Secondary | ICD-10-CM

## 2024-03-04 ENCOUNTER — Ambulatory Visit: Payer: Self-pay | Admitting: Medical

## 2024-03-04 NOTE — Progress Notes (Signed)
 Results were abnormal.  Make sure she has been contacted by breast center about biopsy recommendations.

## 2024-03-04 NOTE — Progress Notes (Signed)
 See other message as she should be getting contacted about biopsies

## 2024-03-08 ENCOUNTER — Ambulatory Visit
Admission: RE | Admit: 2024-03-08 | Discharge: 2024-03-08 | Disposition: A | Discharge status: Home / Self Care | Source: Ambulatory Visit | Attending: Medical | Admitting: Medical

## 2024-03-08 ENCOUNTER — Ambulatory Visit
Admission: RE | Admit: 2024-03-08 | Discharge: 2024-03-08 | Disposition: A | Source: Ambulatory Visit | Attending: Medical | Admitting: Medical

## 2024-03-08 DIAGNOSIS — R921 Mammographic calcification found on diagnostic imaging of breast: Secondary | ICD-10-CM

## 2024-03-08 DIAGNOSIS — N6342 Unspecified lump in left breast, subareolar: Secondary | ICD-10-CM

## 2024-03-08 DIAGNOSIS — N6320 Unspecified lump in the left breast, unspecified quadrant: Secondary | ICD-10-CM

## 2024-03-08 HISTORY — PX: BREAST BIOPSY: SHX20

## 2024-03-09 ENCOUNTER — Telehealth: Payer: Self-pay | Admitting: Medical

## 2024-03-09 ENCOUNTER — Ambulatory Visit
Admission: RE | Admit: 2024-03-09 | Discharge: 2024-03-09 | Disposition: A | Discharge status: Home / Self Care | Source: Ambulatory Visit | Attending: Medical | Admitting: Medical

## 2024-03-09 DIAGNOSIS — N6342 Unspecified lump in left breast, subareolar: Secondary | ICD-10-CM

## 2024-03-09 DIAGNOSIS — R599 Enlarged lymph nodes, unspecified: Secondary | ICD-10-CM

## 2024-03-09 HISTORY — PX: BREAST BIOPSY: SHX20

## 2024-03-09 LAB — SURGICAL PATHOLOGY

## 2024-03-09 NOTE — Telephone Encounter (Signed)
 Pt brought by FMLA papers for being out 03/08/24 & 03/09/24, her job requires it.  She was out due to having procedures done & was told she could not lift more than 5 lbs & her job requires lifting of 35 lbs or more.   She was told by [facility] that you would need to complete the forms.  Please fax back when complete

## 2024-03-10 ENCOUNTER — Other Ambulatory Visit: Payer: Self-pay | Admitting: Medical

## 2024-03-12 LAB — SURGICAL PATHOLOGY

## 2024-03-13 ENCOUNTER — Ambulatory Visit: Payer: Self-pay | Admitting: Internal Medicine

## 2024-03-14 NOTE — Telephone Encounter (Signed)
-----   Message from Ludie Gent sent at 03/14/2024  1:37 PM EDT ----- Please call patient about the forms I received.  It seems to be a disability and return to work form.  I have not taken her out of work or given her any specific restrictions  Are we talking about the breast procedure she had?  What was the restrictions they gave her and for how long was it supposed to apply for?  Has she returned to work?  If she missed specific days out of work, what days that she missed?

## 2024-03-16 ENCOUNTER — Other Ambulatory Visit: Payer: Self-pay | Admitting: Surgery

## 2024-03-16 DIAGNOSIS — C50912 Malignant neoplasm of unspecified site of left female breast: Secondary | ICD-10-CM

## 2024-03-16 DIAGNOSIS — Z853 Personal history of malignant neoplasm of breast: Secondary | ICD-10-CM

## 2024-03-16 NOTE — Progress Notes (Signed)
 REFERRING PHYSICIAN:  Tysinger, Alm Heritage, * PROVIDER:  VICENTA DASIE POLI, MD MRN: [id number] DOB: 11/11/1965 DATE OF ENCOUNTER: 03/16/2024 Subjective    Chief Complaint: New Consultation (Rt breast-SCLEROSING ADENOSIS WITH CALCIFICATIONS)   History of Present Illness: Kathryn Delgado is a 58 y.o. female who is seen today as an office consultation for evaluation of New Consultation (Rt breast-SCLEROSING ADENOSIS WITH CALCIFICATIONS)   This is a 58 year old female who has not had a mammogram in several years who presented initially with pain underneath the areola of the left breast.  She underwent mammograms and ultrasound.  She had a small mass in the right breast which was biopsied and benign.  She had 2 separate masses in the left breast.  1 measured 1.9 cm underneath the nipple areolar complex and the other measured 8 mm and was 5 to 6 cm from the nipple.  Both masses were biopsied and showed invasive ductal carcinoma.  Enlarged lymph node in the axilla was also biopsied showing invasive ductal carcinoma.  The subareolar mass was 95% ER positive, 5% PR positive, HER2 negative, and a Ki-67 of 10%.  The other mass showed 100% ER/PR positivity, was HER2 negative, and had a Ki-67 of 5%.  She denies nipple discharge.  She has had no previous problems regarding her breast.  She has heavy smoker.  There is a family history of breast cancer and multiple aunts on her father side of the family.  She has had no cardiopulmonary issues and has had no problems with previous general anesthesia.    Review of Systems: A complete review of systems was obtained from the patient.  I have reviewed this information and discussed as appropriate with the patient.  See HPI as well for other ROS.  ROS   Medical History: Past Medical History:  Diagnosis Date  . Arthritis     There is no problem list on file for this patient.   Past Surgical History:  Procedure Laterality Date  . JOINT REPLACEMENT        No Known Allergies  Current Outpatient Medications on File Prior to Visit  Medication Sig Dispense Refill  . valsartan  (DIOVAN ) 40 MG tablet Take 40 mg by mouth once daily     No current facility-administered medications on file prior to visit.    Family History  Problem Relation Age of Onset  . Diabetes Mother   . Diabetes Sister   . Diabetes Brother      Social History   Tobacco Use  Smoking Status Every Day  . Current packs/day: 0.50  . Types: Cigarettes  Smokeless Tobacco Never     Social History   Socioeconomic History  . Marital status: Life Partner  Tobacco Use  . Smoking status: Every Day    Current packs/day: 0.50    Types: Cigarettes  . Smokeless tobacco: Never  Substance and Sexual Activity  . Drug use: Never   Social Drivers of Health   Food Insecurity: No Food Insecurity (02/23/2022)   Received from [facility]   Hunger Vital Sign   . Within the past 12 months, you worried that your food would run out before you got the money to buy more.: Never true   . Within the past 12 months, the food you bought just didn't last and you didn't have money to get more.: Never true  Transportation Needs: No Transportation Needs (02/23/2022)   Received from [facility] - Transportation   . Lack of  Transportation (Medical): No   . Lack of Transportation (Non-Medical): No  Housing Stability: Unknown (03/16/2024)   Housing Stability Vital Sign   . Homeless in the Last Year: No    Objective:   Vitals:   03/16/24 1022  BP: (!) 162/102  Pulse: 80  Temp: 36.1 C (97 F)  SpO2: 97%  Weight: (!) 107 kg (236 lb)  Height: 170.2 cm (5' 7)    Body mass index is 36.96 kg/m.  Physical Exam   She appears well on exam.  A chaperone was present for the exam.  I cannot palpate is a masses in the left breast although she is slightly fuller underneath the nipple but there is no nipple retraction on the left.  I cannot palpate the axillary adenopathy  as well.  There are no abnormalities of the right breast.  Labs, Imaging and Diagnostic Testing: I have reviewed her mammograms, ultrasound, and pathology results  Assessment and Plan:     Diagnoses and all orders for this visit:  Invasive ductal carcinoma of breast, left (CMS/HHS-HCC) -     Ambulatory Referral to Oncology-Medical -     Ambulatory Referral to Radiation Oncology -     Ambulatory Referral to Plastic Surgery -     Ambulatory Referral to Physical Therapy -     Ambulatory Referral to Cancer Genetics - [id number]  Neoplasm of left breast, regional lymph node staging category pN0(mol+): positive molecular findings without regional lymph node metastasis detected by histology or immunohistochemistry (CMS/HHS-HCC) -     Ambulatory Referral to Oncology-Medical -     Ambulatory Referral to Radiation Oncology -     Ambulatory Referral to Plastic Surgery -     Ambulatory Referral to Physical Therapy -     Ambulatory Referral to Cancer Genetics - [phone]    I discussed the pathology findings with the patient and her significant other.  We discussed the multidisciplinary approach to breast cancer.  From a surgical standpoint we then discussed breast conservation versus mastectomy.  Right now she wants to proceed with breast conservation.  I explained to her that this would be with a large lumpectomy that would include the nipple areolar complex as a believe the tumor is growing into this area.  We would also need to do a targeted lymph node dissection given her positive lymph node.  I discussed the risk of surgery which includes but is not limited to bleeding, infection, the need for further surgery if for the lymph nodes or margins were positive, cardiopulmonary issues with anesthesia, postoperative recovery, excetra. She will also be referred to medical and radiation oncology as well as physical therapy preoperatively and genetics.  After further discussion, she would also like to consider  oncoplastic reduction so we will also refer her to plastic surgery for their opinion as well. A thorough discussion, she wishes to proceed with the above listed plans.  Complex medical decision making    VICENTA DASIE POLI, MD    I spent a total of 55 minutes in both face-to-face and non-face-to-face activities, excluding procedures performed, for this visit on the date of this encounter.

## 2024-03-19 NOTE — Therapy (Signed)
 OUTPATIENT PHYSICAL THERAPY BREAST CANCER BASELINE EVALUATION   Patient Name: Kathryn Delgado MRN: [id number] DOB:1965-10-28, 58 y.o., female Today's Date: 03/20/2024  END OF SESSION:  PT End of Session - 03/20/24 1509     Visit Number 1    Number of Visits 2    Date for Recertification  07/10/24    Authorization Type baseline visit was approved. Needs auth for ERO if after 06/18/2024    Authorization Time Period 03/20/24 through 06/18/24    PT Start Time 1405    PT Stop Time 1454    PT Time Calculation (min) 49 min    Activity Tolerance Patient tolerated treatment well    Behavior During Therapy [facility] for tasks assessed/performed          Past Medical History:  Diagnosis Date   Alcohol use    Arthritis    self report   Gallstones 05/08/2018   Found on US    Gastroesophageal reflux disease 12/20/2018   Headache    Hyperlipidemia    Hypertension    Liver lesion 12/06/2019   Past Surgical History:  Procedure Laterality Date   BREAST BIOPSY Left 03/08/2024   MM LT BREAST BX W LOC DEV 1ST LESION IMAGE BX SPEC STEREO GUIDE 03/08/2024 [facility]   BREAST BIOPSY Right 03/08/2024   MM RT BREAST BX W LOC DEV 1ST LESION IMAGE BX SPEC STEREO GUIDE 03/08/2024 [facility]   BREAST BIOPSY Left 03/09/2024   US  LT BREAST BX W LOC DEV 1ST LESION IMG BX SPEC US  GUIDE 03/09/2024 [facility]   CHOLECYSTECTOMY     FOOT SURGERY  2005   FOOT SURGERY Left 2009   KNEE ARTHROCENTESIS  08/2018   TOTAL KNEE ARTHROPLASTY Left 09/01/2021   Procedure: LEFT TOTAL KNEE ARTHROPLASTY;  Surgeon: Sheril Coy, MD;  Location: [facility];  Service: Orthopedics;  Laterality: Left;   TOTAL KNEE ARTHROPLASTY Right 02/23/2022   Procedure: RIGHT TOTAL KNEE ARTHROPLASTY;  Surgeon: Sheril Coy, MD;  Location: [facility];  Service: Orthopedics;  Laterality: Right;   WISDOM TOOTH EXTRACTION     Patient Active Problem List   Diagnosis Date Noted   Right foot pain 05/13/2023   Colonoscopy refused  05/13/2023   Mammogram declined 05/13/2023   Primary localized osteoarthritis of right knee 02/23/2022   S/P total knee arthroplasty, right 02/23/2022   Primary osteoarthritis of left knee 09/01/2021   S/P total knee arthroplasty, left 09/01/2021   Elevated fasting blood sugar 08/20/2021   Mixed hyperlipidemia 08/18/2021   GERD without esophagitis 08/18/2021   Family history of diabetes mellitus (DM) 08/18/2021   Body mass index (BMI) of 39.0 to 39.9 in adult 08/18/2021   Acute pain of both shoulders 12/16/2020   Shoulder weakness 12/16/2020   Pain in both wrists 12/16/2020   Swelling of left wrist 12/16/2020   Pain in finger of both hands 12/16/2020   Chronic knee pain after total replacement of left knee joint 07/01/2020   10 year risk of MI or stroke < 7.5% 12/08/2019   Liver lesion 12/06/2019   Aortic atherosclerosis 12/06/2019   Alcohol consumption heavy 12/20/2018   Gallstones 05/08/2018   Essential hypertension 04/25/2018   Chronic pain of left knee 04/25/2018   Elevated ALT measurement 04/25/2018   Former smoker 04/25/2018    PCP: Alm Gent, PA-C  REFERRING PROVIDER: [name], MD  REFERRING DIAG:  [phone] (ICD-10-CM) - Malignant neoplasm of unspecified site of left female breast  C50.912,C77.9 (ICD-10-CM) - Neoplasm of left breast, regional lymph  node staging category pN0(i+): regional lymph node isolated tumor cell clusters detected by hematoxylin and eosin stain or immunohistochemical methods (HCC    THERAPY DIAG:  Malignant neoplasm of left breast in female, estrogen receptor positive, unspecified site of breast (HCC)  Stiffness of left shoulder, not elsewhere classified  Abnormal posture  Rationale for Evaluation and Treatment: Rehabilitation  ONSET DATE: 03/08/24  SUBJECTIVE:                                                                                                                                                                                            SUBJECTIVE STATEMENT: Patient reports she is here today to be seen by her medical team for her newly diagnosed left breast cancer. Patient states that she will have a lumpectomy followed by radiation. Surgery has not been scheduled yet. Patient works for [employer] where she has to lift 60 lb boxes for work. The patient currently has pain in the Lt shoulder, breast and axilla from the biopsy.   PERTINENT HISTORY:  Patient was diagnosed on 03/08/24 with left grade II. The patient has two separates masses in the left breast. One measured measured 1.9 cm underneath the nipple areolar complex and the other measured 8 mm and was 5 to 6 cm from the nipple. Both masses were biopsied and showed invasive ductal carcinoma.  Both masses are ER/PR, HER2 negative with a Ki67 of 5-10%.   PATIENT GOALS:   reduce lymphedema risk and learn post op HEP.   PAIN:  Are you having pain? Yes: NPRS scale: 1/10 Pain location: Lt breast, shoulder & axilla Pain description: Sharp Aggravating factors: Lifting arm too high,  Relieving factors: Ice  PRECAUTIONS: Active CA   RED FLAGS: None   HAND DOMINANCE: right  WEIGHT BEARING RESTRICTIONS: No  FALLS:  Has patient fallen in last 6 months? 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  COGNITION: Overall cognitive status: Within functional limits for tasks assessed    POSTURE:  Forward head and rounded shoulders posture  UPPER EXTREMITY AROM/PROM:  A/PROM RIGHT   eval   Shoulder extension 46  Shoulder flexion 158  Shoulder abduction 103 (has more but hikes)  Shoulder  internal rotation 38  Shoulder external rotation 95    (Blank rows = not  tested)  A/PROM LEFT   eval  Shoulder extension 57  Shoulder flexion 140  Shoulder abduction 132  Shoulder internal rotation 30  Shoulder external rotation 79    (Blank rows = not tested)  CERVICAL AROM: All within normal limits:    Percent limited  Flexion WFL  Extension WFL  Right lateral flexion WFL  Left lateral flexion WFL  Right rotation WFL  Left rotation WFL    UPPER EXTREMITY STRENGTH: 5/5 bilaterally  LYMPHEDEMA ASSESSMENTS (in cm):   LANDMARK RIGHT   eval  10 cm proximal to olecranon process from proximal aspect of olecranon 35.8  Olecranon process 29.4  10 cm proximal to ulnar styloid process from proximal aspect of styloid process 23.6  Just distal to ulnar styloid process 17.5  Across hand at thumb web space 20.1  At base of 2nd digit 7.1  (Blank rows = not tested)  LANDMARK LEFT   eval  10 cm proximal to olecranon process from proximal aspect of olecranon 37.8   Olecranon process 29.1  10 cm proximal to ulnar styloid process from proximal aspect of styloid process 22.9  Just distal to ulnar styloid process 17.5  Across hand at thumb web space 20.3  At base of 2nd digit 7.2  (Blank rows = not tested)  L-DEX LYMPHEDEMA SCREENING:  The patient was assessed using the L-Dex machine today to produce a lymphedema index baseline score. The patient will be reassessed on a regular basis (typically every 3 months) to obtain new L-Dex scores. If the score is > 6.5 points away from his/her baseline score indicating onset of subclinical lymphedema, it will be recommended to wear a compression garment for 4 weeks, 12 hours per day and then be reassessed. If the score continues to be > 6.5 points from baseline at reassessment, we will initiate lymphedema treatment. Assessing in this manner has a 95% rate of preventing clinically significant lymphedema.   L-DEX FLOWSHEETS - 03/20/24 1600        L-DEX LYMPHEDEMA SCREENING   Measurement Type Unilateral    L-DEX MEASUREMENT EXTREMITY Upper Extremity    POSITION  Standing    DOMINANT SIDE Right    At Risk Side Left    BASELINE SCORE (UNILATERAL) 5.6          QUICK DASH SURVEY:  Junie Palin - 03/20/24 0001     Open a tight or new jar No difficulty    Do heavy household chores (wash walls, wash floors) No difficulty    Carry a shopping bag or briefcase No difficulty    Wash your back Mild difficulty    Use a knife to cut food No difficulty    Recreational activities in which you take some force or impact through your arm, shoulder, or hand (golf, hammering, tennis) No difficulty    During the past week, to what extent has your arm, shoulder or hand problem interfered with your normal social activities with family, friends, neighbors, or groups? Slightly    During the past week, to what extent has your arm, shoulder or hand problem limited your work or other regular daily activities Not at all    Arm, shoulder, or hand pain. Severe    Tingling (pins and needles) in your arm, shoulder, or hand None    Difficulty Sleeping No difficulty    DASH Score 11.36 %           PATIENT EDUCATION:  Education details: Time spent educating patient on aspects of self-care  to maximize post op recovery. Patient was educated on where and how to get a post op compression bra to use to reduce post op edema. Patient was also educated on the use of SOZO screenings and surveillance principles for early identification of lymphedema onset. She was instructed to use the post op pillow in the axilla for pressure and pain relief. Patient educated on lymphedema risk reduction and post op shoulder/posture HEP. Person educated: Patient Education method: Explanation, Demonstration, Handout Education comprehension: Patient verbalized understanding and returned demonstration  HOME EXERCISE PROGRAM: Patient was instructed today in a home exercise program  today for post op shoulder range of motion. These included active assist shoulder flexion in sitting, scapular retraction, wall walking with shoulder abduction, and hands behind head external rotation.  She was encouraged to do these twice a day, holding 3 seconds and repeating 5 times when permitted by her physician.   ASSESSMENT:  CLINICAL IMPRESSION: The patient is a 58 year old female who presents to outpatient physical therapy with a recent diagnosis of grade II invasive ductal carcinoma. Baseline shoulder AROM measurements were taken with ROM deficits noted on the Lt side due to biopsy performed. Baseline lymphedema circumference measurements are obtained bilaterally, as well as SOZO screening. She is planning to have lumpectomy with radiation following. Patient education is provided regarding lymphatic anatomy and physiology, SOZO screening, post-op exercises, and obtaining a compression bra. She will benefit from a post op PT reassessment to determine needs and from L-Dex screens every 3 months for 2 years to detect subclinical lymphedema. Pt will benefit from skilled therapeutic intervention to improve on the following deficits: Decreased knowledge of precautions, impaired UE functional use, pain, decreased ROM, postural dysfunction.   PT treatment/interventions: ADL/self-care home management, pt/family education, therapeutic exercise  REHAB POTENTIAL: Good  CLINICAL DECISION MAKING: Stable/uncomplicated  EVALUATION COMPLEXITY: Moderate   GOALS: Goals reviewed with patient? YES  LONG TERM GOALS: (STG=LTG)    Name Target Date Goal status  1 Pt will be able to verbalize understanding of pertinent lymphedema risk reduction practices relevant to her dx specifically related to skin care.  Baseline:  No knowledge 03/19/2024 Achieved at eval  2 Pt will be able to return demo and/or verbalize understanding of the post op HEP related to regaining shoulder ROM. Baseline:  No knowledge  03/19/2024 Achieved at eval  3 Pt will be able to verbalize understanding of the importance of viewing the post op After Breast CA Class video for further lymphedema risk reduction education and therapeutic exercise.  Baseline:  No knowledge 03/19/2024 Achieved at eval  4 Pt will demo she has regained full shoulder ROM and function post operatively compared to baselines.  Baseline: See objective measurements taken today. 07/10/24 NEW    PLAN:  PT FREQUENCY/DURATION: EVAL and 1 follow up appointment.   PLAN FOR NEXT SESSION: will reassess 3-4 weeks post op to determine needs.   Patient will follow up at outpatient cancer rehab 3-4 weeks following surgery.  If the patient requires physical therapy at that time, a specific plan will be dictated and sent to the referring physician for approval. The patient was educated today on appropriate basic range of motion exercises to begin post operatively and the importance of viewing the After Breast Cancer class video following surgery.  Patient was educated today on lymphedema risk reduction practices as it pertains to recommendations that will benefit the patient immediately following surgery.  She verbalized good understanding.    Physical Therapy Information for After Breast Cancer  Surgery/Treatment:  Lymphedema is a swelling condition that you may be at risk for in your arm if you have lymph nodes removed from the armpit area.  After a sentinel node biopsy, the risk is approximately 5-9% and is higher after an axillary node dissection.  There is treatment available for this condition and it is not life-threatening.  Contact your physician or physical therapist with concerns. You may begin the 4 shoulder/posture exercises (see additional sheet) when permitted by your physician (typically a week after surgery).  If you have drains, you may need to wait until those are removed before beginning range of motion exercises.  A general recommendation is to not  lift your arms above shoulder height until drains are removed.  These exercises should be done to your tolerance and gently.  This is not a no pain/no gain type of recovery so listen to your body and stretch into the range of motion that you can tolerate, stopping if you have pain.  If you are having immediate reconstruction, ask your plastic surgeon about doing exercises as he or she may want you to wait. We encourage you to view the After Breast Cancer class video following surgery.  You will learn information related to lymphedema risk, prevention and treatment and additional exercises to regain mobility following surgery.   While undergoing any medical procedure or treatment, try to avoid blood pressure being taken or needle sticks from occurring on the arm on the side of cancer.   This recommendation begins after surgery and continues for the rest of your life.  This may help reduce your risk of getting lymphedema (swelling in your arm). An excellent resource for those seeking information on lymphedema is the National Lymphedema Network's web site. It can be accessed at www.lymphnet.org If you notice swelling in your hand, arm or breast at any time following surgery (even if it is many years from now), please contact your doctor or physical therapist to discuss this.  Lymphedema can be treated at any time but it is easier for you if it is treated early on.  If you feel like your shoulder motion is not returning to normal in a reasonable amount of time, please contact your surgeon or physical therapist.  [facility] [phone]. [address], [place] [address]  [facility] CLASS After Breast Cancer Class  After Breast Cancer Class is a specially designed exercise class video to assist you in a safe recover after having breast cancer surgery.  In this video you will learn how to get back to full function whether your drains were just removed or if you had  surgery a month ago. The video can be viewed on this page: [email or web address].boyd-meyer.org/ or on YouTube here: [email or web address].  Class Goals  Understand specific stretches to improve the flexibility of you chest and shoulder. Learn ways to safely strengthen your upper body and improve your posture. Understand the warning signs of infection and why you may be at risk for an arm infection. Learn about Lymphedema and prevention.  ** You do not need to view this video until after surgery.  Drains should be removed to participate in the recommended exercises on the video.  Patient was instructed today in a home exercise program today for post op shoulder range of motion. These included active assist shoulder flexion in sitting, scapular retraction, wall walking with shoulder abduction, and hands behind head external rotation.  She was encouraged to do these twice a day,  holding 3 seconds and repeating 5 times when permitted by her physician.    Randall Pack, SPT 03/20/2024, 4:58 PM  This session was supervised by a licensed physical therapist.   Blaire Breedlove Blue, PT 03/20/24 4:58 PM

## 2024-03-20 ENCOUNTER — Encounter: Payer: Self-pay | Admitting: Physical Therapy

## 2024-03-20 ENCOUNTER — Other Ambulatory Visit: Payer: Self-pay

## 2024-03-20 ENCOUNTER — Ambulatory Visit: Attending: Surgery | Admitting: Physical Therapy

## 2024-03-20 DIAGNOSIS — M25612 Stiffness of left shoulder, not elsewhere classified: Secondary | ICD-10-CM | POA: Insufficient documentation

## 2024-03-20 DIAGNOSIS — R293 Abnormal posture: Secondary | ICD-10-CM | POA: Insufficient documentation

## 2024-03-20 DIAGNOSIS — Z170 Estrogen receptor positive status [ER+]: Secondary | ICD-10-CM | POA: Diagnosis present

## 2024-03-20 DIAGNOSIS — C50912 Malignant neoplasm of unspecified site of left female breast: Secondary | ICD-10-CM | POA: Insufficient documentation

## 2024-03-21 ENCOUNTER — Inpatient Hospital Stay: Attending: Genetic Counselor | Admitting: Genetic Counselor

## 2024-03-21 ENCOUNTER — Encounter: Payer: Self-pay | Admitting: Genetic Counselor

## 2024-03-21 ENCOUNTER — Other Ambulatory Visit: Payer: Self-pay | Admitting: *Deleted

## 2024-03-21 ENCOUNTER — Telehealth: Payer: Self-pay | Admitting: Radiation Oncology

## 2024-03-21 ENCOUNTER — Inpatient Hospital Stay

## 2024-03-21 DIAGNOSIS — I10 Essential (primary) hypertension: Secondary | ICD-10-CM | POA: Insufficient documentation

## 2024-03-21 DIAGNOSIS — Z170 Estrogen receptor positive status [ER+]: Secondary | ICD-10-CM | POA: Insufficient documentation

## 2024-03-21 DIAGNOSIS — Z87891 Personal history of nicotine dependence: Secondary | ICD-10-CM | POA: Insufficient documentation

## 2024-03-21 DIAGNOSIS — C50011 Malignant neoplasm of nipple and areola, right female breast: Secondary | ICD-10-CM | POA: Insufficient documentation

## 2024-03-21 DIAGNOSIS — Z853 Personal history of malignant neoplasm of breast: Secondary | ICD-10-CM

## 2024-03-21 DIAGNOSIS — E785 Hyperlipidemia, unspecified: Secondary | ICD-10-CM | POA: Insufficient documentation

## 2024-03-21 DIAGNOSIS — Z809 Family history of malignant neoplasm, unspecified: Secondary | ICD-10-CM | POA: Diagnosis not present

## 2024-03-21 DIAGNOSIS — F1011 Alcohol abuse, in remission: Secondary | ICD-10-CM | POA: Insufficient documentation

## 2024-03-21 DIAGNOSIS — M129 Arthropathy, unspecified: Secondary | ICD-10-CM | POA: Insufficient documentation

## 2024-03-21 DIAGNOSIS — K219 Gastro-esophageal reflux disease without esophagitis: Secondary | ICD-10-CM | POA: Insufficient documentation

## 2024-03-21 DIAGNOSIS — C50012 Malignant neoplasm of nipple and areola, left female breast: Secondary | ICD-10-CM | POA: Insufficient documentation

## 2024-03-21 DIAGNOSIS — Z1721 Progesterone receptor positive status: Secondary | ICD-10-CM | POA: Insufficient documentation

## 2024-03-21 NOTE — Telephone Encounter (Signed)
 Unable to leave message for patient to call back to schedule consult per 10/10 referral due to voicemail full. Will try again tomorrow.

## 2024-03-21 NOTE — Progress Notes (Signed)
 REFERRING PROVIDER: Vernetta Berg, MD   PRIMARY PROVIDER:  Bulah Alm RAMAN, PA-C  PRIMARY REASON FOR VISIT:  1. Personal history of breast cancer   2. Family history of cancer     HISTORY OF PRESENT ILLNESS:   Kathryn Delgado, a 58 y.o. female, was seen for a [place] cancer genetics consultation at the request of Vernetta Berg, MD due to a personal and family history of cancer.  Kathryn Delgado presents to clinic today to discuss the possibility of a hereditary predisposition to cancer, to discuss genetic testing, and to further clarify her future cancer risks, as well as potential cancer risks for family members.   Kathryn Delgado is a 58 y.o. female diagnosed with invasive ductal carcinoma of the left breast, planning for lumpectomy and radiation. She does not currently have a date for surgery.   RISK FACTORS:  Menarche was at age 77.  Nulliparous.  Ovaries intact: yes.  Uterus intact: yes.  Menopausal status: postmenopausal. Around age 75 HRT use: 0 years. Colonoscopy: no; not examined. Mammogram within the last year: yes.   Past Medical History:  Diagnosis Date   Alcohol use    Arthritis    self report   Gallstones 05/08/2018   Found on US    Gastroesophageal reflux disease 12/20/2018   Headache    Hyperlipidemia    Hypertension    Liver lesion 12/06/2019    Past Surgical History:  Procedure Laterality Date   BREAST BIOPSY Left 03/08/2024   MM LT BREAST BX W LOC DEV 1ST LESION IMAGE BX SPEC STEREO GUIDE 03/08/2024 [facility]   BREAST BIOPSY Right 03/08/2024   MM RT BREAST BX W LOC DEV 1ST LESION IMAGE BX SPEC STEREO GUIDE 03/08/2024 [facility]   BREAST BIOPSY Left 03/09/2024   US  LT BREAST BX W LOC DEV 1ST LESION IMG BX SPEC US  GUIDE 03/09/2024 [facility]   CHOLECYSTECTOMY     FOOT SURGERY  2005   FOOT SURGERY Left 2009   KNEE ARTHROCENTESIS  08/2018   TOTAL KNEE ARTHROPLASTY Left 09/01/2021   Procedure: LEFT TOTAL KNEE ARTHROPLASTY;  Surgeon:  Sheril Coy, MD;  Location: [facility];  Service: Orthopedics;  Laterality: Left;   TOTAL KNEE ARTHROPLASTY Right 02/23/2022   Procedure: RIGHT TOTAL KNEE ARTHROPLASTY;  Surgeon: Sheril Coy, MD;  Location: [facility];  Service: Orthopedics;  Laterality: Right;   WISDOM TOOTH EXTRACTION      Social History   Socioeconomic History   Marital status: Significant Other    Spouse name: Not on file   Number of children: Not on file   Years of education: Not on file   Highest education level: Not on file  Occupational History   Not on file  Tobacco Use   Smoking status: Former    Current packs/day: 0.00    Average packs/day: 0.5 packs/day for 40.0 years (20.0 ttl pk-yrs)    Types: Cigarettes    Start date: 12/05/1981    Quit date: 12/05/2021    Years since quitting: 2.2   Smokeless tobacco: Never  Vaping Use   Vaping status: Never Used  Substance and Sexual Activity   Alcohol use: Yes    Alcohol/week: 2.0 standard drinks of alcohol    Types: 2 Cans of beer per week    Comment: socially   Drug use: No    Comment: former cocaine user    Sexual activity: Yes    Partners: Female    Birth control/protection: Post-menopausal  Other Topics Concern  Not on file  Social History Narrative   Not on file   Social Drivers of Health   Financial Resource Strain: Not on file  Food Insecurity: No Food Insecurity (02/23/2022)   Hunger Vital Sign    Worried About Running Out of Food in the Last Year: Never true    Ran Out of Food in the Last Year: Never true  Transportation Needs: No Transportation Needs (02/23/2022)   PRAPARE - Administrator, Civil Service (Medical): No    Lack of Transportation (Non-Medical): No  Physical Activity: Not on file  Stress: Not on file  Social Connections: Not on file     FAMILY HISTORY:  We obtained a detailed, 4-generation family history.  Significant diagnoses are listed below: Family History  Problem Relation Age of Onset   Diabetes Mother     Thyroid disease Half-Sister    Diabetes Half-Sister    Thyroid disease Half-Sister    Diabetes Half-Brother    Cancer Paternal Aunt        dx >50   Cancer Paternal Aunt        dx >50   Colon polyps Neg Hx    Colon cancer Neg Hx    Esophageal cancer Neg Hx    Rectal cancer Neg Hx    Stomach cancer Neg Hx     Kathryn Delgado is unaware of previous family history of genetic testing for hereditary cancer risks.        GENETIC COUNSELING ASSESSMENT: Kathryn Delgado is a 58 y.o. female with a personal and family history of cancer which is somewhat suggestive of a hereditary predisposition to cancer given her personal history of breast cancer under the age of 75. We, therefore, discussed and recommended the following at today's visit.   DISCUSSION: We discussed that 5 - 10% of cancer is hereditary, with most cases of breast cancer associated with pathogenic variants in BRCA1/2.  There are other genes that can be associated with hereditary breast cancer syndromes.  We discussed that testing is beneficial for several reasons including knowing how to follow individuals after completing their treatment, identifying whether potential treatment options would be beneficial, and understanding if other family members could be at risk for cancer and allowing them to undergo genetic testing. Additionally, we discussed that results of genetic testing can impact the type of surgery recommended for treating breast cancer, as some genes are associated with increased risks for a second primary breast cancer. Some individuals might choose to pursue bilateral mastectomy if this is the case. Kathryn Delgado shared that genetic test results would not impact treatment decisions. She is also unsure if she would want to learn about any additional cancer risks at this time.   We reviewed the characteristics, features and inheritance patterns of hereditary cancer syndromes. We also discussed genetic testing, including the appropriate  family members to test, the process of testing, insurance coverage and turn-around-time for results. We discussed the implications of a negative, positive, carrier and/or variant of uncertain significant result. We recommended Kathryn Delgado pursue genetic testing for a panel that includes genes associated with breast cancer.   Based on Kathryn Delgado's personal and family history of cancer, she meets medical criteria for genetic testing. Despite that she meets criteria, she may still have an out of pocket cost. We discussed that if her out of pocket cost for testing is over $100, the laboratory should contact them to discuss self-pay prices, patient pay assistance programs, if applicable, and other  billing options.  PLAN: After considering the risks, benefits, and limitations, Kathryn Delgado declined to complete genetic testing at this time. She shared that she is feeling overwhelmed and would prefer to think on her options and recontact us  in the future if interested in testing.    Despite our recommendation, Kathryn Delgado did not wish to pursue genetic testing at today's visit. We understand this decision and remain available to coordinate genetic testing at any time in the future. We, therefore, recommend Kathryn Delgado continue to follow the cancer screening guidelines given by her primary healthcare provider and oncology providers.  Lastly, we encouraged Kathryn Delgado to remain in contact with cancer genetics annually so that we can continuously update the family history and inform her of any changes in cancer genetics and testing that may be of benefit for this family.   Kathryn Delgado questions were answered to her satisfaction today. Our contact information was provided should additional questions or concerns arise. Thank you for the referral and allowing us  to share in the care of your patient.   Burnard Ogren, MS, [facility] Licensed, Retail [email or web address] phone: [phone]   40  minutes were spent on the date of the encounter in service to the patient including preparation, face-to-face consultation, documentation and care coordination. The patient was seen alone.  Drs. Gudena and/or Lanny were available to discuss this case as needed.   _______________________________________________________________________ For Office Staff:  Number of people involved in session: 1 Was an Intern/ student involved with case: no

## 2024-03-22 ENCOUNTER — Telehealth: Payer: Self-pay | Admitting: Radiation Oncology

## 2024-03-22 NOTE — Telephone Encounter (Signed)
Left message for patient to call back to schedule consult per 10/10 referral.

## 2024-03-22 NOTE — Telephone Encounter (Signed)
 Checking on form completion?

## 2024-03-23 ENCOUNTER — Ambulatory Visit (INDEPENDENT_AMBULATORY_CARE_PROVIDER_SITE_OTHER): Admitting: Plastic Surgery

## 2024-03-23 ENCOUNTER — Encounter: Payer: Self-pay | Admitting: Plastic Surgery

## 2024-03-23 VITALS — BP 170/104 | HR 66 | Ht 67.0 in | Wt 236.8 lb

## 2024-03-23 DIAGNOSIS — C50912 Malignant neoplasm of unspecified site of left female breast: Secondary | ICD-10-CM | POA: Diagnosis not present

## 2024-03-23 DIAGNOSIS — Z170 Estrogen receptor positive status [ER+]: Secondary | ICD-10-CM | POA: Insufficient documentation

## 2024-03-23 DIAGNOSIS — C50919 Malignant neoplasm of unspecified site of unspecified female breast: Secondary | ICD-10-CM | POA: Insufficient documentation

## 2024-03-23 NOTE — Progress Notes (Signed)
 Patient ID: Kathryn Delgado, female    DOB: 1966/05/10, 58 y.o.   MRN: [id number]   Chief Complaint  Patient presents with   Consult        Breast Problem   Breast Cancer    The patient is a 58 year old female here for evaluation of her breast.  She was recently told she had left breast cancer.  She is planning on a left partial mastectomy for invasive ductal carcinoma.  She is currently at a DDD cup size.  She is 5 feet 7 inches tall and weighs 236 pounds.  Her original complaint was pain under the left nipple.  When she was examined with mammogram and ultrasound she was found to have 2 separate masses in the left breast under the nipple areola complex.  Both masses were biopsied and showed invasive ductal carcinoma.  The lymph node was positive as well.  The mass was 95% ER positive and 5% PR positive.  The HER2 was negative and the Ki-67 was 10%.    Review of Systems  Constitutional: Negative.   HENT: Negative.    Eyes: Negative.   Respiratory: Negative.    Cardiovascular: Negative.   Gastrointestinal: Negative.   Endocrine: Negative.   Genitourinary: Negative.   Musculoskeletal: Negative.     Past Medical History:  Diagnosis Date   Alcohol use    Arthritis    self report   Gallstones 05/08/2018   Found on US    Gastroesophageal reflux disease 12/20/2018   Headache    Hyperlipidemia    Hypertension    Liver lesion 12/06/2019    Past Surgical History:  Procedure Laterality Date   BREAST BIOPSY Left 03/08/2024   MM LT BREAST BX W LOC DEV 1ST LESION IMAGE BX SPEC STEREO GUIDE 03/08/2024 [facility]   BREAST BIOPSY Right 03/08/2024   MM RT BREAST BX W LOC DEV 1ST LESION IMAGE BX SPEC STEREO GUIDE 03/08/2024 [facility]   BREAST BIOPSY Left 03/09/2024   US  LT BREAST BX W LOC DEV 1ST LESION IMG BX SPEC US  GUIDE 03/09/2024 [facility]   CHOLECYSTECTOMY     FOOT SURGERY  2005   FOOT SURGERY Left 2009   KNEE ARTHROCENTESIS  08/2018   TOTAL KNEE  ARTHROPLASTY Left 09/01/2021   Procedure: LEFT TOTAL KNEE ARTHROPLASTY;  Surgeon: Sheril Coy, MD;  Location: [facility];  Service: Orthopedics;  Laterality: Left;   TOTAL KNEE ARTHROPLASTY Right 02/23/2022   Procedure: RIGHT TOTAL KNEE ARTHROPLASTY;  Surgeon: Sheril Coy, MD;  Location: [facility];  Service: Orthopedics;  Laterality: Right;   WISDOM TOOTH EXTRACTION        Current Outpatient Medications:    valsartan  (DIOVAN ) 40 MG tablet, Take 1 tablet (40 mg total) by mouth daily., Disp: 90 tablet, Rfl: 3   Objective:   Vitals:   03/23/24 0919  BP: (!) 170/104  Pulse: 66  SpO2: 99%    Physical Exam Vitals reviewed.  Constitutional:      Appearance: Normal appearance.  HENT:     Head: Atraumatic.  Cardiovascular:     Rate and Rhythm: Normal rate.     Pulses: Normal pulses.  Abdominal:     Palpations: Abdomen is soft.  Skin:    General: Skin is warm.     Capillary Refill: Capillary refill takes less than 2 seconds.  Neurological:     Mental Status: She is alert and oriented to person, place, and time.  Psychiatric:  Mood and Affect: Mood normal.        Behavior: Behavior normal.        Thought Content: Thought content normal.        Judgment: Judgment normal.     Assessment & Plan:  Malignant neoplasm of left breast in female, estrogen receptor positive, unspecified site of breast [facility])  The options for reconstruction we explained to the patient / family for breast reconstruction.  For patient info: There are two general categories of reconstruction.  We can reconstruction a breast with implants or use the patient's own tissue.  These were further discussed as listed.  Breast reconstruction is an optional procedure and eligibility depends on the full spectrum of the health of the patient and any co-morbidities.  More than one surgery is often needed to complete the reconstruction process.  The process can take three to twelve months to complete.  The breasts will  not be identical due to many factors such as rib differences, shoulder asymmetry and treatments such as radiation.  The goal is to get the breasts to look normal and symmetrical in clothes.  Scars are a part of surgery and may fade some in time but will always be present under clothes.  Surgery may be an option on the non-cancer breast to achieve more symmetry.  No matter which procedure is chosen there is always the risk of complications and even failure of the body to heal.  This could result in no breast.    The options for reconstruction include:  1. Placement of a tissue expander with Acellular dermal matrix. When the expander is the desired size surgery is performed to remove the expander and place an implant.  In some cases the implant can be placed without an expander.  2. Autologous reconstruction can include using a muscle or tissue from another area of the body to create a breast.  3. Combined procedures (ie. latissismus dorsi flap) can be done with an expander / implant placed under the muscle.   The risks, benefits, scars and recovery time were discussed for each of the above. Risks include bleeding, infection, hematoma, seroma, scarring, pain, wound healing complications, flap loss, fat necrosis, capsular contracture, need for implant removal, donor site complications, bulge, hernia, umbilical necrosis, need for urgent reoperation, and need for dressing changes.   The procedure the patient selected / that was best for the patient, was then discussed in further detail.  Total time: 45 minutes. This includes time spent with the patient during the visit as well as time spent before and after the visit reviewing the chart, documenting the encounter, making phone calls and reviewing studies.   The patient is planning on bilateral breast reductions for the oncoplastic side for the left.  We did not need to go through any great detail about the other options since she already knows what she wants  to do.  Her chart was reviewed as well as her other results and this is discussed with Dr. Vernetta.  Will plan on bilateral oncoplastic breast reduction.  I will check with Dr. Vernetta again about whether or not we can salvage the nipple as a graft.  Pictures were obtained of the patient and placed in the chart with the patient's or guardian's permission.   Estefana RAMAN Jamillah Camilo, DO

## 2024-03-23 NOTE — H&P (View-Only) (Signed)
 Patient ID: Kathryn Delgado, female    DOB: 1966/05/10, 58 y.o.   MRN: [id number]   Chief Complaint  Patient presents with   Consult        Breast Problem   Breast Cancer    The patient is a 58 year old female here for evaluation of her breast.  She was recently told she had left breast cancer.  She is planning on a left partial mastectomy for invasive ductal carcinoma.  She is currently at a DDD cup size.  She is 5 feet 7 inches tall and weighs 236 pounds.  Her original complaint was pain under the left nipple.  When she was examined with mammogram and ultrasound she was found to have 2 separate masses in the left breast under the nipple areola complex.  Both masses were biopsied and showed invasive ductal carcinoma.  The lymph node was positive as well.  The mass was 95% ER positive and 5% PR positive.  The HER2 was negative and the Ki-67 was 10%.    Review of Systems  Constitutional: Negative.   HENT: Negative.    Eyes: Negative.   Respiratory: Negative.    Cardiovascular: Negative.   Gastrointestinal: Negative.   Endocrine: Negative.   Genitourinary: Negative.   Musculoskeletal: Negative.     Past Medical History:  Diagnosis Date   Alcohol use    Arthritis    self report   Gallstones 05/08/2018   Found on US    Gastroesophageal reflux disease 12/20/2018   Headache    Hyperlipidemia    Hypertension    Liver lesion 12/06/2019    Past Surgical History:  Procedure Laterality Date   BREAST BIOPSY Left 03/08/2024   MM LT BREAST BX W LOC DEV 1ST LESION IMAGE BX SPEC STEREO GUIDE 03/08/2024 [facility]   BREAST BIOPSY Right 03/08/2024   MM RT BREAST BX W LOC DEV 1ST LESION IMAGE BX SPEC STEREO GUIDE 03/08/2024 [facility]   BREAST BIOPSY Left 03/09/2024   US  LT BREAST BX W LOC DEV 1ST LESION IMG BX SPEC US  GUIDE 03/09/2024 [facility]   CHOLECYSTECTOMY     FOOT SURGERY  2005   FOOT SURGERY Left 2009   KNEE ARTHROCENTESIS  08/2018   TOTAL KNEE  ARTHROPLASTY Left 09/01/2021   Procedure: LEFT TOTAL KNEE ARTHROPLASTY;  Surgeon: Sheril Coy, MD;  Location: [facility];  Service: Orthopedics;  Laterality: Left;   TOTAL KNEE ARTHROPLASTY Right 02/23/2022   Procedure: RIGHT TOTAL KNEE ARTHROPLASTY;  Surgeon: Sheril Coy, MD;  Location: [facility];  Service: Orthopedics;  Laterality: Right;   WISDOM TOOTH EXTRACTION        Current Outpatient Medications:    valsartan  (DIOVAN ) 40 MG tablet, Take 1 tablet (40 mg total) by mouth daily., Disp: 90 tablet, Rfl: 3   Objective:   Vitals:   03/23/24 0919  BP: (!) 170/104  Pulse: 66  SpO2: 99%    Physical Exam Vitals reviewed.  Constitutional:      Appearance: Normal appearance.  HENT:     Head: Atraumatic.  Cardiovascular:     Rate and Rhythm: Normal rate.     Pulses: Normal pulses.  Abdominal:     Palpations: Abdomen is soft.  Skin:    General: Skin is warm.     Capillary Refill: Capillary refill takes less than 2 seconds.  Neurological:     Mental Status: She is alert and oriented to person, place, and time.  Psychiatric:  Mood and Affect: Mood normal.        Behavior: Behavior normal.        Thought Content: Thought content normal.        Judgment: Judgment normal.     Assessment & Plan:  Malignant neoplasm of left breast in female, estrogen receptor positive, unspecified site of breast [facility])  The options for reconstruction we explained to the patient / family for breast reconstruction.  For patient info: There are two general categories of reconstruction.  We can reconstruction a breast with implants or use the patient's own tissue.  These were further discussed as listed.  Breast reconstruction is an optional procedure and eligibility depends on the full spectrum of the health of the patient and any co-morbidities.  More than one surgery is often needed to complete the reconstruction process.  The process can take three to twelve months to complete.  The breasts will  not be identical due to many factors such as rib differences, shoulder asymmetry and treatments such as radiation.  The goal is to get the breasts to look normal and symmetrical in clothes.  Scars are a part of surgery and may fade some in time but will always be present under clothes.  Surgery may be an option on the non-cancer breast to achieve more symmetry.  No matter which procedure is chosen there is always the risk of complications and even failure of the body to heal.  This could result in no breast.    The options for reconstruction include:  1. Placement of a tissue expander with Acellular dermal matrix. When the expander is the desired size surgery is performed to remove the expander and place an implant.  In some cases the implant can be placed without an expander.  2. Autologous reconstruction can include using a muscle or tissue from another area of the body to create a breast.  3. Combined procedures (ie. latissismus dorsi flap) can be done with an expander / implant placed under the muscle.   The risks, benefits, scars and recovery time were discussed for each of the above. Risks include bleeding, infection, hematoma, seroma, scarring, pain, wound healing complications, flap loss, fat necrosis, capsular contracture, need for implant removal, donor site complications, bulge, hernia, umbilical necrosis, need for urgent reoperation, and need for dressing changes.   The procedure the patient selected / that was best for the patient, was then discussed in further detail.  Total time: 45 minutes. This includes time spent with the patient during the visit as well as time spent before and after the visit reviewing the chart, documenting the encounter, making phone calls and reviewing studies.   The patient is planning on bilateral breast reductions for the oncoplastic side for the left.  We did not need to go through any great detail about the other options since she already knows what she wants  to do.  Her chart was reviewed as well as her other results and this is discussed with Dr. Vernetta.  Will plan on bilateral oncoplastic breast reduction.  I will check with Dr. Vernetta again about whether or not we can salvage the nipple as a graft.  Pictures were obtained of the patient and placed in the chart with the patient's or guardian's permission.   Estefana RAMAN Jamillah Camilo, DO

## 2024-03-23 NOTE — Telephone Encounter (Signed)
 Forms faxed

## 2024-03-26 ENCOUNTER — Telehealth: Payer: Self-pay | Admitting: Radiation Oncology

## 2024-03-26 NOTE — Telephone Encounter (Signed)
Left message for patient to call back to schedule consult per 10/10 referral.

## 2024-03-27 ENCOUNTER — Inpatient Hospital Stay

## 2024-03-27 ENCOUNTER — Telehealth: Payer: Self-pay | Admitting: Radiation Oncology

## 2024-03-27 ENCOUNTER — Inpatient Hospital Stay: Admitting: Hematology and Oncology

## 2024-03-27 VITALS — BP 150/90 | HR 80 | Temp 97.2°F | Resp 18 | Ht 67.0 in | Wt 235.2 lb

## 2024-03-27 DIAGNOSIS — I10 Essential (primary) hypertension: Secondary | ICD-10-CM | POA: Diagnosis not present

## 2024-03-27 DIAGNOSIS — Z87891 Personal history of nicotine dependence: Secondary | ICD-10-CM | POA: Diagnosis not present

## 2024-03-27 DIAGNOSIS — C50011 Malignant neoplasm of nipple and areola, right female breast: Secondary | ICD-10-CM | POA: Diagnosis not present

## 2024-03-27 DIAGNOSIS — C50012 Malignant neoplasm of nipple and areola, left female breast: Secondary | ICD-10-CM | POA: Diagnosis present

## 2024-03-27 DIAGNOSIS — Z170 Estrogen receptor positive status [ER+]: Secondary | ICD-10-CM

## 2024-03-27 DIAGNOSIS — E785 Hyperlipidemia, unspecified: Secondary | ICD-10-CM | POA: Diagnosis not present

## 2024-03-27 DIAGNOSIS — F1011 Alcohol abuse, in remission: Secondary | ICD-10-CM | POA: Diagnosis not present

## 2024-03-27 DIAGNOSIS — C50112 Malignant neoplasm of central portion of left female breast: Secondary | ICD-10-CM | POA: Diagnosis not present

## 2024-03-27 DIAGNOSIS — K219 Gastro-esophageal reflux disease without esophagitis: Secondary | ICD-10-CM | POA: Diagnosis not present

## 2024-03-27 DIAGNOSIS — Z1721 Progesterone receptor positive status: Secondary | ICD-10-CM | POA: Diagnosis not present

## 2024-03-27 DIAGNOSIS — Z809 Family history of malignant neoplasm, unspecified: Secondary | ICD-10-CM | POA: Diagnosis not present

## 2024-03-27 DIAGNOSIS — M129 Arthropathy, unspecified: Secondary | ICD-10-CM | POA: Diagnosis not present

## 2024-03-27 NOTE — Telephone Encounter (Signed)
Left message for patient to call back to schedule consult per 10/10 referral.

## 2024-03-27 NOTE — Progress Notes (Signed)
 [facility] CONSULT NOTE  Patient Care Team: Tysinger, Alm GORMAN RIGGERS as PCP - General (Family Medicine)  CHIEF COMPLAINTS/PURPOSE OF CONSULTATION:  Newly diagnosed breast cancer  HISTORY OF PRESENTING ILLNESS:   History of Present Illness Kathryn Delgado is a 58 year old female with invasive ductal carcinoma who presents for oncology consultation.  She discovered a lump under her breast with sharp, intermittent pain and nipple irritation. A mammogram and subsequent ultrasounds and biopsies confirmed invasive ductal carcinoma in the left breast, with a benign result in the right breast.  The left breast cancer includes two tumors measuring 1.9 cm and 0.8 cm, with a positive lymph node under the arm. It is estrogen receptor positive (95%), progesterone receptor positive (5%), and HER2 negative. The cancer is grade 2 with a KI-67 proliferation index of 5%.  She has consulted with a surgeon and a plastic surgeon regarding a lumpectomy with axillary lymph node surgery and possible nipple removal, followed by reconstructive surgery. 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 She has FMLA and short-term disability available and is considering taking time off post-surgery for recovery.     I reviewed her records extensively and collaborated the history with the patient.  SUMMARY OF ONCOLOGIC HISTORY: Oncology History  Malignant neoplasm of central portion of left breast in female, estrogen receptor positive (HCC)  03/09/2024 Initial Diagnosis   Evaluation of left nipple pain: Mammogram and ultrasound: 1.9 cm spiculated mass, 8 mm retroareolar mass, right breast calcifications 6 mm left breast biopsy retroareolar: Grade 2 invasive moderately differentiated adenocarcinoma, left axilla lymph node positive, ER 100%, PR 1%, Ki67 5%, HER2 0, left breast biopsy retroareolar: Grade 2  IDC ER 95%, PR 5%, Ki67 10%, HER2 0, right breast biopsy: Sclerosing adenosis   03/27/2024 Cancer Staging   Staging form: Breast, AJCC 8th Edition - Clinical stage from 03/27/2024: Stage IB (cT1c, cN1, cM0, G2, ER+, PR+, HER2-) - Signed by Odean Potts, MD on 03/27/2024 Stage prefix: Initial diagnosis Histologic grading system: 3 grade system      MEDICAL HISTORY:  Past Medical History:  Diagnosis Date   Alcohol use    Arthritis    self report   Gallstones 05/08/2018   Found on US    Gastroesophageal reflux disease 12/20/2018   Headache    Hyperlipidemia    Hypertension    Liver lesion 12/06/2019    SURGICAL HISTORY: Past Surgical History:  Procedure Laterality Date   BREAST BIOPSY Left 03/08/2024   MM LT BREAST BX W LOC DEV 1ST LESION IMAGE BX SPEC STEREO GUIDE 03/08/2024 [facility]   BREAST BIOPSY Right 03/08/2024   MM RT BREAST BX W LOC DEV 1ST LESION IMAGE BX SPEC STEREO GUIDE 03/08/2024 [facility]   BREAST BIOPSY Left 03/09/2024   US  LT BREAST BX W LOC DEV 1ST LESION IMG BX SPEC US  GUIDE 03/09/2024 [facility]   CHOLECYSTECTOMY     FOOT SURGERY  2005   FOOT SURGERY Left 2009   KNEE ARTHROCENTESIS  08/2018   TOTAL KNEE ARTHROPLASTY Left 09/01/2021   Procedure: LEFT TOTAL KNEE ARTHROPLASTY;  Surgeon: Sheril Coy, MD;  Location: [facility];  Service: Orthopedics;  Laterality: Left;   TOTAL KNEE ARTHROPLASTY Right 02/23/2022   Procedure: RIGHT TOTAL KNEE ARTHROPLASTY;  Surgeon: Sheril Coy, MD;  Location: [facility];  Service: Orthopedics;  Laterality: Right;   WISDOM TOOTH EXTRACTION  SOCIAL HISTORY: Social History   Socioeconomic History   Marital status: Significant Other    Spouse name: Not on file   Number of children: Not on file   Years of education: Not on file   Highest education level: Not on file  Occupational History   Not on file  Tobacco Use   Smoking status: Former    Current packs/day: 0.00    Average packs/day: 0.5  packs/day for 40.0 years (20.0 ttl pk-yrs)    Types: Cigarettes    Start date: 12/05/1981    Quit date: 12/05/2021    Years since quitting: 2.3   Smokeless tobacco: Never  Vaping Use   Vaping status: Never Used  Substance and Sexual Activity   Alcohol use: Yes    Alcohol/week: 2.0 standard drinks of alcohol    Types: 2 Cans of beer per week    Comment: socially   Drug use: No    Comment: former cocaine user    Sexual activity: Yes    Partners: Female    Birth control/protection: Post-menopausal  Other Topics Concern   Not on file  Social History Narrative   Not on file   Social Drivers of Health   Financial Resource Strain: Not on file  Food Insecurity: No Food Insecurity (03/27/2024)   Hunger Vital Sign    Worried About Running Out of Food in the Last Year: Never true    Ran Out of Food in the Last Year: Never true  Transportation Needs: No Transportation Needs (03/27/2024)   PRAPARE - Administrator, Civil Service (Medical): No    Lack of Transportation (Non-Medical): No  Physical Activity: Not on file  Stress: Not on file  Social Connections: Not on file  Intimate Partner Violence: Not At Risk (03/27/2024)   Humiliation, Afraid, Rape, and Kick questionnaire    Fear of Current or Ex-Partner: No    Emotionally Abused: No    Physically Abused: No    Sexually Abused: No    FAMILY HISTORY: Family History  Problem Relation Age of Onset   Diabetes Mother    Thyroid disease Half-Sister    Diabetes Half-Sister    Thyroid disease Half-Sister    Diabetes Half-Brother    Cancer Paternal Aunt        dx >50   Cancer Paternal Aunt        dx >50   Colon polyps Neg Hx    Colon cancer Neg Hx    Esophageal cancer Neg Hx    Rectal cancer Neg Hx    Stomach cancer Neg Hx     ALLERGIES:  has no known allergies.  MEDICATIONS:  Current Outpatient Medications  Medication Sig Dispense Refill   valsartan  (DIOVAN ) 40 MG tablet Take 1 tablet (40 mg total) by mouth  daily. 90 tablet 3   No current facility-administered medications for this visit.    REVIEW OF SYSTEMS:   Constitutional: Denies fevers, chills or abnormal night sweats Breast: Palpable finding in the left breast All other systems were reviewed with the patient and are negative.  PHYSICAL EXAMINATION: ECOG PERFORMANCE STATUS: 1 - Symptomatic but completely ambulatory  Vitals:   03/27/24 1325  BP: (!) 150/90  Pulse: 80  Resp: 18  Temp: (!) 97.2 F (36.2 C)  SpO2: 98%   Filed Weights   03/27/24 1325  Weight: 235 lb 3.2 oz (106.7 kg)    GENERAL:alert, no distress and comfortable    LABORATORY DATA:  I have reviewed  the data as listed Lab Results  Component Value Date   WBC 4.9 05/13/2023   HGB 13.6 05/13/2023   HCT 43.0 05/13/2023   MCV 89 05/13/2023   PLT 207 05/13/2023   Lab Results  Component Value Date   NA 141 05/13/2023   K 4.4 05/13/2023   CL 109 (H) 05/13/2023   CO2 20 05/13/2023    RADIOGRAPHIC STUDIES: I have personally reviewed the radiological reports and agreed with the findings in the report.  ASSESSMENT AND PLAN:  Malignant neoplasm of central portion of left breast in female, estrogen receptor positive (HCC) 03/09/2024:Evaluation of left nipple pain: Mammogram and ultrasound: 1.9 cm spiculated mass, 8 mm retroareolar mass, right breast calcifications 6 mm left breast biopsy retroareolar: Grade 2 invasive moderately differentiated adenocarcinoma, left axilla lymph node positive, ER 100%, PR 1%, Ki67 5%, HER2 0, left breast biopsy retroareolar: Grade 2 IDC ER 95%, PR 5%, Ki67 10%, HER2 0, right breast biopsy: Sclerosing adenosis  Pathology and radiology counseling:Discussed with the patient, the details of pathology including the type of breast cancer,the clinical staging, the significance of ER, PR and HER-2/neu receptors and the implications for treatment. After reviewing the pathology in detail, we proceeded to discuss the different treatment  options between surgery, radiation, chemotherapy, antiestrogen therapies.  Recommendations: 1. Breast conserving surgery followed by 2. Oncotype DX testing to determine if chemotherapy would be of any benefit followed by 3. Adjuvant radiation therapy followed by 4. Adjuvant antiestrogen therapy  Oncotype counseling: I discussed Oncotype DX test. I explained to the patient that this is a 21 gene panel to evaluate patient tumors DNA to calculate recurrence score. This would help determine whether patient has high risk or low risk breast cancer. She understands that if her tumor was found to be high risk, she would benefit from systemic chemotherapy. If low risk, no need of chemotherapy.  Return to clinic after surgery to discuss final pathology report and then determine if Oncotype DX testing will need to be sent.  She works third shift.  She works from 7 PM to 6:30 AM.  She works for [employer].  If she were to do radiation she would probably want to do it at 7:30 in the morning.   All questions were answered. The patient knows to call the clinic with any problems, questions or concerns. I personally spent a total of 30 minutes in the care of the patient today including preparing to see the patient, getting/reviewing separately obtained history, performing a medically appropriate exam/evaluation, counseling and educating, placing orders, referring and communicating with other health care professionals, documenting clinical information in the EHR, independently interpreting results, communicating results, and coordinating care.   Viinay K Isobel Eisenhuth, MD 03/27/24

## 2024-03-27 NOTE — Assessment & Plan Note (Signed)
 03/09/2024:Evaluation of left nipple pain: Mammogram and ultrasound: 1.9 cm spiculated mass, 8 mm retroareolar mass, right breast calcifications 6 mm left breast biopsy retroareolar: Grade 2 invasive moderately differentiated adenocarcinoma, left axilla lymph node positive, ER 100%, PR 1%, Ki67 5%, HER2 0, left breast biopsy retroareolar: Grade 2 IDC ER 95%, PR 5%, Ki67 10%, HER2 0, right breast biopsy: Sclerosing adenosis  Pathology and radiology counseling:Discussed with the patient, the details of pathology including the type of breast cancer,the clinical staging, the significance of ER, PR and HER-2/neu receptors and the implications for treatment. After reviewing the pathology in detail, we proceeded to discuss the different treatment options between surgery, radiation, chemotherapy, antiestrogen therapies.  Recommendations: 1. Breast conserving surgery followed by 2. Oncotype DX testing to determine if chemotherapy would be of any benefit followed by 3. Adjuvant radiation therapy followed by 4. Adjuvant antiestrogen therapy  Oncotype counseling: I discussed Oncotype DX test. I explained to the patient that this is a 21 gene panel to evaluate patient tumors DNA to calculate recurrence score. This would help determine whether patient has high risk or low risk breast cancer. She understands that if her tumor was found to be high risk, she would benefit from systemic chemotherapy. If low risk, no need of chemotherapy.  Return to clinic after surgery to discuss final pathology report and then determine if Oncotype DX testing will need to be sent.

## 2024-03-28 ENCOUNTER — Telehealth: Payer: Self-pay | Admitting: Radiation Oncology

## 2024-03-28 NOTE — Telephone Encounter (Signed)
 Mailed letter for patient to call back to schedule consult per 10/10 referral.

## 2024-03-30 ENCOUNTER — Encounter: Payer: Self-pay | Admitting: *Deleted

## 2024-03-30 ENCOUNTER — Telehealth: Payer: Self-pay | Admitting: *Deleted

## 2024-03-30 NOTE — Telephone Encounter (Signed)
 Called patient and introduced navigation role. Discussed upcoming surgery. No other needs or concerns at this time. She is aware of navigators contact info if needed.

## 2024-04-02 ENCOUNTER — Inpatient Hospital Stay

## 2024-04-02 ENCOUNTER — Other Ambulatory Visit: Payer: Self-pay | Admitting: Surgery

## 2024-04-02 ENCOUNTER — Other Ambulatory Visit: Payer: Self-pay

## 2024-04-02 ENCOUNTER — Encounter: Payer: Self-pay | Admitting: *Deleted

## 2024-04-02 ENCOUNTER — Encounter (HOSPITAL_BASED_OUTPATIENT_CLINIC_OR_DEPARTMENT_OTHER): Payer: Self-pay | Admitting: Surgery

## 2024-04-02 ENCOUNTER — Encounter (HOSPITAL_BASED_OUTPATIENT_CLINIC_OR_DEPARTMENT_OTHER)
Admission: RE | Admit: 2024-04-02 | Discharge: 2024-04-02 | Disposition: A | Discharge status: Home / Self Care | Source: Ambulatory Visit | Attending: Surgery | Admitting: Surgery

## 2024-04-02 DIAGNOSIS — Z01810 Encounter for preprocedural cardiovascular examination: Secondary | ICD-10-CM | POA: Diagnosis present

## 2024-04-02 DIAGNOSIS — Z853 Personal history of malignant neoplasm of breast: Secondary | ICD-10-CM

## 2024-04-02 MED ORDER — CHLORHEXIDINE GLUCONATE CLOTH 2 % EX PADS: 6.0000 | MEDICATED_PAD | Freq: Once | CUTANEOUS | Status: DC

## 2024-04-02 MED ORDER — ENSURE PRE-SURGERY PO LIQD: 296.0000 mL | Freq: Once | ORAL | Status: DC

## 2024-04-02 NOTE — Progress Notes (Signed)
 Radiation Oncology         [phone] ________________________________  Name: Kathryn Delgado        MRN: [id number]  Date of Service: 04/05/2024 DOB: 1965-06-12  CC:Tysinger, Alm GORMAN DEVONNA Vernetta Vicenta, MD     REFERRING PHYSICIAN: Vernetta Vicenta, MD   DIAGNOSIS: There were no encounter diagnoses.   HISTORY OF PRESENT ILLNESS: Kathryn Delgado is a 58 y.o. female seen at the request of Dr. Marland for new diagnosis of left breast cancer.  The patient presented for diagnostic mammogram due to left nipple pain.  Imaging showed grouped pleomorphic calcifications approximately 6 mm in length in the upper outer right breast and no suspicious mass or architectural distortion.  In the left breast, subtle microcalcifications were noted within a spiculated high density mass underlying the marker about the retroareolar breast measuring up to 1.8 cm with subtle microcalcifications measuring 8 mm around the mass.  Microcalcifications were also noted within an enlarged high density rounded appearing mass or lymph node around the axilla.  By ultrasound on 03/01/2024, a mass in the 3 o'clock position measuring 1.9 cm was present, no ultrasound correlate was noted for the smaller mass seen in the retroareolar breast, and the axilla shows a 1.8 cm mass/lymph node.  It was recommended that she undergo stereotactic biopsy for bilateral breast calcifications, and ultrasound-guided biopsy to the left breast and axilla. Stereotactic biopsies on 03/08/24 showed sclerosing adenosis with calcifications of the right breast. The left calcifications however showed grade 2 invasive ductal carcinoma. The cancer was ER/PR positive, HER 2 negative with a Ki 67 of 10%. Ultrasound biopsies of the left breast and node on 03/09/24 showed grade 2 invasive ductal carcinoma of the breast and the node was consistent with metastatic disease, and the breast tumor was ER/PR positive, HER2 negative with a Ki 67 of 5%. She is planning to  undergo left breast lumpectomy with targeted node biopsy on 04/09/24. She's seen to consider adjuvant radiotherapy.    PREVIOUS RADIATION THERAPY: {EXAM; YES/NO:19492::No}   PAST MEDICAL HISTORY:  Past Medical History:  Diagnosis Date   Alcohol use    Arthritis    self report   Gallstones 05/08/2018   Found on US    Gastroesophageal reflux disease 12/20/2018   Headache    Hyperlipidemia    Hypertension    Liver lesion 12/06/2019       PAST SURGICAL HISTORY: Past Surgical History:  Procedure Laterality Date   BREAST BIOPSY Left 03/08/2024   MM LT BREAST BX W LOC DEV 1ST LESION IMAGE BX SPEC STEREO GUIDE 03/08/2024 [facility]   BREAST BIOPSY Right 03/08/2024   MM RT BREAST BX W LOC DEV 1ST LESION IMAGE BX SPEC STEREO GUIDE 03/08/2024 [facility]   BREAST BIOPSY Left 03/09/2024   US  LT BREAST BX W LOC DEV 1ST LESION IMG BX SPEC US  GUIDE 03/09/2024 [facility]   CHOLECYSTECTOMY     FOOT SURGERY  2005   FOOT SURGERY Left 2009   KNEE ARTHROCENTESIS  08/2018   TOTAL KNEE ARTHROPLASTY Left 09/01/2021   Procedure: LEFT TOTAL KNEE ARTHROPLASTY;  Surgeon: Sheril Coy, MD;  Location: [facility];  Service: Orthopedics;  Laterality: Left;   TOTAL KNEE ARTHROPLASTY Right 02/23/2022   Procedure: RIGHT TOTAL KNEE ARTHROPLASTY;  Surgeon: Sheril Coy, MD;  Location: [facility];  Service: Orthopedics;  Laterality: Right;   WISDOM TOOTH EXTRACTION       FAMILY HISTORY:  Family History  Problem Relation Age of Onset  Diabetes Mother    Thyroid disease Half-Sister    Diabetes Half-Sister    Thyroid disease Half-Sister    Diabetes Half-Brother    Cancer Paternal Aunt        dx >50   Cancer Paternal Aunt        dx >50   Colon polyps Neg Hx    Colon cancer Neg Hx    Esophageal cancer Neg Hx    Rectal cancer Neg Hx    Stomach cancer Neg Hx      SOCIAL HISTORY:  reports that she quit smoking about 2 years ago. Her smoking use included cigarettes. She started smoking  about 42 years ago. She has a 20 pack-year smoking history. She has never used smokeless tobacco. She reports current alcohol use of about 2.0 standard drinks of alcohol per week. She reports that she does not use drugs. The patient is in a relationship and lives in [place]. She ***   ALLERGIES: Patient has no known allergies.   MEDICATIONS:  Current Outpatient Medications  Medication Sig Dispense Refill   valsartan  (DIOVAN ) 40 MG tablet Take 1 tablet (40 mg total) by mouth daily. 90 tablet 3   No current facility-administered medications for this visit.   Facility-Administered Medications Ordered in Other Visits  Medication Dose Route Frequency Provider Last Rate Last Admin   Chlorhexidine  Gluconate Cloth 2 % PADS 6 each  6 each Topical Once Vernetta Berg, MD       And   Chlorhexidine  Gluconate Cloth 2 % PADS 6 each  6 each Topical Once Vernetta Berg, MD       NOREEN ON 04/03/2024] feeding supplement (ENSURE PRE-SURGERY) liquid 296 mL  296 mL Oral Once Vernetta Berg, MD         REVIEW OF SYSTEMS: On review of systems, the patient reports that she is doing ***     PHYSICAL EXAM:  Wt Readings from Last 3 Encounters:  03/27/24 235 lb 3.2 oz (106.7 kg)  03/23/24 236 lb 12.8 oz (107.4 kg)  02/16/24 231 lb 12.8 oz (105.1 kg)   Temp Readings from Last 3 Encounters:  03/27/24 (!) 97.2 F (36.2 C)  01/21/24 97.9 F (36.6 C)  05/13/23 (!) 97 F (36.1 C)   BP Readings from Last 3 Encounters:  03/27/24 (!) 150/90  03/23/24 (!) 170/104  02/16/24 (!) 142/80   Pulse Readings from Last 3 Encounters:  03/27/24 80  03/23/24 66  02/16/24 87    In general this is a well appearing *** female in no acute distress. She's alert and oriented x4 and appropriate throughout the examination. Cardiopulmonary assessment is negative for acute distress and she exhibits normal effort. Bilateral breast exam is deferred.    ECOG = ***  0 - Asymptomatic (Fully active, able to  carry on all predisease activities without restriction)  1 - Symptomatic but completely ambulatory (Restricted in physically strenuous activity but ambulatory and able to carry out work of a light or sedentary nature. For example, light housework, office work)  2 - Symptomatic, <50% in bed during the day (Ambulatory and capable of all self care but unable to carry out any work activities. Up and about more than 50% of waking hours)  3 - Symptomatic, >50% in bed, but not bedbound (Capable of only limited self-care, confined to bed or chair 50% or more of waking hours)  4 - Bedbound (Completely disabled. Cannot carry on any self-care. Totally confined to bed or chair)  5 -  Death   Raylene MM, Creech RH, Tormey DC, et al. [phone]). Toxicity and response criteria of the [facility]. Am. DOROTHA Bridges. Oncol. 5 (6): 649-55    LABORATORY DATA:  Lab Results  Component Value Date   WBC 4.9 05/13/2023   HGB 13.6 05/13/2023   HCT 43.0 05/13/2023   MCV 89 05/13/2023   PLT 207 05/13/2023   Lab Results  Component Value Date   NA 141 05/13/2023   K 4.4 05/13/2023   CL 109 (H) 05/13/2023   CO2 20 05/13/2023   Lab Results  Component Value Date   ALT 28 05/13/2023   AST 22 05/13/2023   ALKPHOS 89 05/13/2023   BILITOT 0.5 05/13/2023      RADIOGRAPHY: MM LT BREAST BX W LOC DEV 1ST LESION IMAGE BX SPEC STEREO GUIDE Addendum Date: 03/19/2024 ADDENDUM REPORT: 03/19/2024 08:30 ADDENDUM: PATHOLOGY revealed: Site 1. Breast, right, needle core biopsy, calcifications. upper, outer quadrant (x clip)- SCLEROSING ADENOSIS WITH CALCIFICATIONS. NEGATIVE FOR MALIGNANCY. Pathology results are CONCORDANT with imaging findings, per Dr. Alm Parkins. PATHOLOGY revealed: Site 2. Breast, left, needle core biopsy, mass with calcs. retroareolar (ribbon clip)- INVASIVE DUCTAL CARCINOMA. OVERALL GRADE: 2. LYMPHOVASCULAR INVASION: NOT IDENTIFIED. CANCER LENGTH: 0.5 CM. CALCIFICATIONS: PRESENT Pathology  results are CONCORDANT with imaging findings, per Dr. Alm Parkins. Pathology results and recommendations below were discussed with patient by telephone on 03/09/2024 by Mliss Molt RN. Patient reported biopsy site within normal limits with slight tenderness at the site. Post biopsy care instructions were reviewed, questions were answered and my direct phone number was provided to patient. Patient was instructed to call [facility] if any concerns or questions arise related to the biopsy. RECOMMENDATION: Surgical and oncological consultation. Request for surgical consultation relayed to Amarilis Belflower at [facility] on 03/09/2024 by Mliss Molt RN. Pathology results reported by Mliss Molt RN 03/09/2024. Electronically Signed   By: Alm Parkins M.D.   On: 03/19/2024 08:30   Result Date: 03/19/2024 CLINICAL DATA:  Patient has a highly suspicious mass in the retroareolar left breast as well as a left axilla mass or abnormal lymph node. She is scheduled for ultrasound-guided core needle biopsy of these masses tomorrow. Today she presents for stereotactic core needle biopsy of right breast calcifications and a small mammographic mass, without sonographic correlate, in the medial central retroareolar left breast. EXAM: RIGHT BREAST STEREOTACTIC CORE NEEDLE BIOPSY LEFT BREAST STEREOTACTIC CORE NEEDLE BIOPSY COMPARISON:  Previous exam(s). FINDINGS: The patient and I discussed the procedure of stereotactic-guided biopsy including benefits and alternatives. We discussed the high likelihood of a successful procedure. We discussed the risks of the procedure including infection, bleeding, tissue injury, clip migration, and inadequate sampling. Informed written consent was given. The usual time out protocol was performed immediately prior to the procedure. Biopsy #1: Calcifications, upper outer right breast. Using sterile technique and 1% Lidocaine  as local anesthetic, under stereotactic  guidance, a 9 gauge vacuum assisted device was used to perform core needle biopsy of calcifications in the upper outer quadrant of the right breast using a lateral approach. Specimen radiograph was performed showing calcifications for which biopsy was performed. Specimens with calcifications are identified for pathology. Lesion quadrant: Upper outer quadrant At the conclusion of the procedure, an X shaped tissue marker clip was deployed into the biopsy cavity. Follow-up 2-view mammogram was performed and dictated separately. Biopsy #1: Small mass in the medial, central retroareolar left breast. Using sterile technique and 1% Lidocaine  as local  anesthetic, under stereotactic guidance, a 9 gauge vacuum assisted device was used to perform core needle biopsy of the mass in the medial, central retroareolar left breast using a medial approach. Several specimens were noted to have calcifications. Specimens with calcifications are identified for pathology. Lesion quadrant: Lower inner quadrant At the conclusion of the procedure, ribbon shaped tissue marker clip was deployed into the biopsy cavity. Follow-up 2-view mammogram was performed and dictated separately. IMPRESSION: Stereotactic-guided biopsy of right breast calcifications. Stereotactic-guided biopsy of a small left breast mass with calcifications. No apparent complications. Electronically Signed: By: Alm Parkins M.D. On: 03/08/2024 09:37   MM RT BREAST BX W LOC DEV 1ST LESION IMAGE BX SPEC STEREO GUIDE Addendum Date: 03/19/2024 ADDENDUM REPORT: 03/19/2024 08:30 ADDENDUM: PATHOLOGY revealed: Site 1. Breast, right, needle core biopsy, calcifications. upper, outer quadrant (x clip)- SCLEROSING ADENOSIS WITH CALCIFICATIONS. NEGATIVE FOR MALIGNANCY. Pathology results are CONCORDANT with imaging findings, per Dr. Alm Parkins. PATHOLOGY revealed: Site 2. Breast, left, needle core biopsy, mass with calcs. retroareolar (ribbon clip)- INVASIVE DUCTAL CARCINOMA.  OVERALL GRADE: 2. LYMPHOVASCULAR INVASION: NOT IDENTIFIED. CANCER LENGTH: 0.5 CM. CALCIFICATIONS: PRESENT Pathology results are CONCORDANT with imaging findings, per Dr. Alm Parkins. Pathology results and recommendations below were discussed with patient by telephone on 03/09/2024 by Mliss Molt RN. Patient reported biopsy site within normal limits with slight tenderness at the site. Post biopsy care instructions were reviewed, questions were answered and my direct phone number was provided to patient. Patient was instructed to call [facility] if any concerns or questions arise related to the biopsy. RECOMMENDATION: Surgical and oncological consultation. Request for surgical consultation relayed to Elspeth Blucher at [facility] on 03/09/2024 by Mliss Molt RN. Pathology results reported by Mliss Molt RN 03/09/2024. Electronically Signed   By: Alm Parkins M.D.   On: 03/19/2024 08:30   Result Date: 03/19/2024 CLINICAL DATA:  Patient has a highly suspicious mass in the retroareolar left breast as well as a left axilla mass or abnormal lymph node. She is scheduled for ultrasound-guided core needle biopsy of these masses tomorrow. Today she presents for stereotactic core needle biopsy of right breast calcifications and a small mammographic mass, without sonographic correlate, in the medial central retroareolar left breast. EXAM: RIGHT BREAST STEREOTACTIC CORE NEEDLE BIOPSY LEFT BREAST STEREOTACTIC CORE NEEDLE BIOPSY COMPARISON:  Previous exam(s). FINDINGS: The patient and I discussed the procedure of stereotactic-guided biopsy including benefits and alternatives. We discussed the high likelihood of a successful procedure. We discussed the risks of the procedure including infection, bleeding, tissue injury, clip migration, and inadequate sampling. Informed written consent was given. The usual time out protocol was performed immediately prior to the procedure. Biopsy #1:  Calcifications, upper outer right breast. Using sterile technique and 1% Lidocaine  as local anesthetic, under stereotactic guidance, a 9 gauge vacuum assisted device was used to perform core needle biopsy of calcifications in the upper outer quadrant of the right breast using a lateral approach. Specimen radiograph was performed showing calcifications for which biopsy was performed. Specimens with calcifications are identified for pathology. Lesion quadrant: Upper outer quadrant At the conclusion of the procedure, an X shaped tissue marker clip was deployed into the biopsy cavity. Follow-up 2-view mammogram was performed and dictated separately. Biopsy #1: Small mass in the medial, central retroareolar left breast. Using sterile technique and 1% Lidocaine  as local anesthetic, under stereotactic guidance, a 9 gauge vacuum assisted device was used to perform core needle biopsy of the mass in the  medial, central retroareolar left breast using a medial approach. Several specimens were noted to have calcifications. Specimens with calcifications are identified for pathology. Lesion quadrant: Lower inner quadrant At the conclusion of the procedure, ribbon shaped tissue marker clip was deployed into the biopsy cavity. Follow-up 2-view mammogram was performed and dictated separately. IMPRESSION: Stereotactic-guided biopsy of right breast calcifications. Stereotactic-guided biopsy of a small left breast mass with calcifications. No apparent complications. Electronically Signed: By: Alm Parkins M.D. On: 03/08/2024 09:37   US  LT BREAST BX W LOC DEV 1ST LESION IMG BX SPEC US  GUIDE Addendum Date: 03/14/2024 ADDENDUM REPORT: 03/14/2024 09:30 ADDENDUM: PATHOLOGY revealed: Site 1. Breast, left, needle core biopsy, retroareolar mass marked with a heart clip - INVASIVE MODERATELY DIFFERENTIATED DUCTAL ADENOCARCINOMA, GRADE 2 - OVERALL GRADE: GRADE 2 (6/9) - FOCALLY SUSPICIOUS FOR ANGIOLYMPHATIC INVASION - TUMOR MEASURES 9.5MM IN  GREATEST LINEAR EXTENTMICROCALCIFICATIONS NOT IDENTIFIED. Pathology results are CONCORDANT with imaging findings, per Dr. Rosina Gelineau. PATHOLOGY revealed: Site 2. Lymph node, needle/core biopsy, left axilla - METASTATIC MODERATELY DIFFERENTIATED DUCTAL ADENOCARCINOMA WITH EXTRACELLULAR MUCIN LYMPH NODE PRESENT. Pathology results are CONCORDANT with imaging findings, per Dr. Rosina Gelineau. Please note, the second smaller left breast mass biopsied under stereotactic guided biopsy the day before and marked with a ribbon clip is also noted to be invassive ductal carcinoma. Pathology results and recommendations were discussed with patient via telephone on 03/12/2024 by Mliss Molt RN. Patient reported biopsy site doing well with no adverse symptoms, and only slight tenderness at the site. Post biopsy care instructions were reviewed, questions were answered and my direct phone number was provided. Patient was instructed to call [facility] for any additional questions or concerns related to biopsy site. RECOMMENDATIONS: 1. Surgical and oncological consultation. Patient has an appointment with Dr. Vernetta at [facility] on 03/16/24. Pathology results reported by Mliss Molt RN 03/12/2024. Electronically Signed   By: Rosina Gelineau M.D.   On: 03/14/2024 09:30   Result Date: 03/14/2024 CLINICAL DATA:  58 year old female with four suspicious findings on recent diagnostic workup; right breast calcifications, 2 left breast masses and 1 abnormal left axillary node. Patient underwent a right stereotactic guided biopsy for the right breast calcifications as well as a stereotactic guided biopsy for the smaller left breast mass yesterday. She presents today for ultrasound-guided biopsy of the larger retroareolar left breast mass and ultrasound-guided biopsy of the left axillary abnormal node. EXAM: ULTRASOUND GUIDED LEFT BREAST CORE NEEDLE BIOPSY COMPARISON:  Previous exam(s). PROCEDURE: I  met with the patient and we discussed the procedure of ultrasound-guided biopsy, including benefits and alternatives. We discussed the high likelihood of a successful procedure. We discussed the risks of the procedure, including infection, bleeding, tissue injury, clip migration, and inadequate sampling. Informed written consent was given. The usual time-out protocol was performed immediately prior to the procedure. Lesion quadrant: Retroareolar Using sterile technique and 1% Lidocaine  as local anesthetic, under direct ultrasound visualization, a 14 gauge spring-loaded device was used to perform biopsy of 1.2 by 2.1 x 1.9 cm using a lateral approach. At the conclusion of the procedure heart shaped tissue marker clip was deployed into the biopsy cavity. Follow up 2 view mammogram was performed and dictated separately. IMPRESSION: Ultrasound guided biopsy of the spiculated 2.1 cm left retroareolar mass with placement of a heart clip. No apparent complications. Electronically Signed: By: Rosina Gelineau M.D. On: 03/09/2024 09:43   MM CLIP PLACEMENT LEFT Result Date: 03/09/2024 CLINICAL DATA:  58 year old female status  post ultrasound-guided biopsy of left retroareolar spiculated mass (heart clip) and left axillary lymph node (HydroMARK clip). Patient underwent left stereotactic guided biopsy of a smaller mass yesterday with placement of a ribbon clip. EXAM: 3D DIAGNOSTIC LEFT MAMMOGRAM POST ULTRASOUND BIOPSY COMPARISON:  Previous exam(s). ACR Breast Density Category a: The breasts are almost entirely fatty. FINDINGS: 3D Mammographic images were obtained following ultrasound guided biopsy of a spiculated 1.9 cm left retroareolar mass and an abnormal enlarged left axillary node. The heart-shaped biopsy clip in the retroareolar mass is in appropriate position. Again noted is a ribbon clip at site of small mass which was biopsied under stereotactic guidance yesterday. HydroMARK clip is noted in the left axillary node,  better seen on tomosynthesis images. IMPRESSION: Appropriate positioning of the retroareolar heart clip and left axillary HydroMARK clip. Final Assessment: Post Procedure Mammograms for Marker Placement Electronically Signed   By: Rosina Gelineau M.D.   On: 03/09/2024 09:49   US  AXILLARY NODE CORE BIOPSY LEFT Result Date: 03/09/2024 CLINICAL DATA:  58 year old female with four abnormalities on recent diagnostic workup; right breast calcifications, 2 left breast masses, and 1 abnormal left axillary lymph node. Patient had stereo biopsy of the right breast calcifications and the smaller left breast mass yesterday. She presents today for ultrasound-guided biopsy of the larger retroareolar left breast mass and the left axillary node. EXAM: US  AXILLARY NODE CORE BIOPSY LEFT COMPARISON:  Previous exam(s). PROCEDURE: I met with the patient and we discussed the procedure of ultrasound-guided biopsy, including benefits and alternatives. We discussed the high likelihood of a successful procedure. We discussed the risks of the procedure, including infection, bleeding, tissue injury, clip migration, and inadequate sampling. Informed written consent was given. The usual time-out protocol was performed immediately prior to the procedure. Using sterile technique and 1% Lidocaine  with and without epinephrine  as local anesthetic, under direct ultrasound visualization, a 14 gauge spring-loaded device was used to perform biopsy of an abnormal left axillary lymph node using a caudal cranial approach. At the conclusion of the procedure [facility] tissue marker clip was deployed into the biopsy cavity. Follow up 2 view mammogram was performed and dictated separately. IMPRESSION: Ultrasound guided biopsy of enlarged abnormal left axillary lymph node. No apparent complications. Electronically Signed   By: Rosina Gelineau M.D.   On: 03/09/2024 09:45   MM CLIP PLACEMENT RIGHT Result Date: 03/08/2024 CLINICAL DATA:  Patient presents for  stereotactic core needle biopsy of right breast calcifications and a left breast mass. EXAM: 3D DIAGNOSTIC RIGHT MAMMOGRAM POST STEREOTACTIC BIOPSY 3D DIAGNOSTIC LEFT MAMMOGRAM POST STEREOTACTIC BIOPSY COMPARISON:  Previous exam(s). ACR Breast Density Category b: There are scattered areas of fibroglandular density. FINDINGS: Right breast. 3D Mammographic images were obtained following stereotactic guided biopsy of right breast calcifications. The biopsy marking clip is in expected position at the site of biopsy. Left breast. 3D Mammographic images were obtained following stereotactic guided biopsy of a left breast mass. The biopsy marking clip is in expected position at the site of biopsy. IMPRESSION: Appropriate positioning of the X shaped biopsy marking clip at the site of biopsy in the upper outer right breast in the expected location of the small group of calcifications. Appropriate positioning of the ribbon shaped biopsy marking clip at the site of biopsy in the central retroareolar left breast projecting along the superior margin of the small mass. Final Assessment: Post Procedure Mammograms for Marker Placement Electronically Signed   By: Alm Parkins M.D.   On: 03/08/2024 09:44   MM  CLIP PLACEMENT LEFT Result Date: 03/08/2024 CLINICAL DATA:  Patient presents for stereotactic core needle biopsy of right breast calcifications and a left breast mass. EXAM: 3D DIAGNOSTIC RIGHT MAMMOGRAM POST STEREOTACTIC BIOPSY 3D DIAGNOSTIC LEFT MAMMOGRAM POST STEREOTACTIC BIOPSY COMPARISON:  Previous exam(s). ACR Breast Density Category b: There are scattered areas of fibroglandular density. FINDINGS: Right breast. 3D Mammographic images were obtained following stereotactic guided biopsy of right breast calcifications. The biopsy marking clip is in expected position at the site of biopsy. Left breast. 3D Mammographic images were obtained following stereotactic guided biopsy of a left breast mass. The biopsy marking clip is  in expected position at the site of biopsy. IMPRESSION: Appropriate positioning of the X shaped biopsy marking clip at the site of biopsy in the upper outer right breast in the expected location of the small group of calcifications. Appropriate positioning of the ribbon shaped biopsy marking clip at the site of biopsy in the central retroareolar left breast projecting along the superior margin of the small mass. Final Assessment: Post Procedure Mammograms for Marker Placement Electronically Signed   By: Alm Parkins M.D.   On: 03/08/2024 09:44       IMPRESSION/PLAN: 1. Stage IB, [id number], grade 2 ER/PR positive invasive ductal carcinoma of th left breast. Dr. Dewey discusses the pathology findings and reviews the nature of node positive breast disease. She is planning breast conservation with lumpectomy with targeted node biopsy. Dr. Odean plans on ordering Oncotype Dx score to determine a role for systemic therapy. Provided that chemotherapy is not indicated, the patient's course would then be followed by external radiotherapy to the breast  to reduce risks of local recurrence. Dr. Odean anticipates adjuvant antiestrogen therapy to follow. We discussed the risks, benefits, short, and long term effects of radiotherapy, as well as the curative intent, and the patient is interested in proceeding. Dr. Dewey discusses the delivery and logistics of radiotherapy and anticipates a course of 6 1/2 weeks of radiotherapy to the left breast and regional nodes. We will see her back a few weeks after surgery to discuss the simulation process and anticipate we starting radiotherapy about 4-6 weeks after surgery.  2. Possible genetic predisposition to malignancy. The patient is a candidate for genetic testing given age and personal history. ***   In a visit lasting *** minutes, greater than 50% of the time was spent face to face reviewing her case, as well as in preparation of, discussing, and coordinating the  patient's care.  The above documentation reflects my direct findings during this shared patient visit. Please see the separate note by Dr. Dewey on this date for the remainder of the patient's plan of care.    Donald KYM Husband, [facility]    **Disclaimer: This note was dictated with voice recognition software. Similar sounding words can inadvertently be transcribed and this note may contain transcription errors which may not have been corrected upon publication of note.**

## 2024-04-02 NOTE — Progress Notes (Signed)

## 2024-04-03 NOTE — Progress Notes (Addendum)
 Patient ID: Kathryn Delgado, female    DOB: 06-Dec-1965, 58 y.o.   MRN: [id number]  Preoperative Appointment    ICD-10-CM   1. Malignant neoplasm of left breast in female, estrogen receptor positive, unspecified site of breast [facility])  C50.912    Z17.0        History of Present Illness: Kathryn Delgado is a 58 y.o.  female  with a history of left breast cancer.  She presents for preoperative evaluation for upcoming procedure, Bilateral Breast Reduction with liposuction, scheduled for 05/06/2024 with Dr.  Lowery.  Of note, patient will be undergoing left breast lumpectomy with radioactive seed and axillary lymph node dissection with Dr. Vernetta at the same time.  The patient has not had problems with anesthesia.  Patient denies any history of cardiac disease.  She denies taking any blood thinners.  Patient reports that she stopped smoking last week.  She states that she and Dr. Lowery discussed this at her preop and that Dr. Lowery was aware.  Patient denies taking any birth control or hormone replacement.  She denies any history of miscarriages.  She denies any personal family history of blood clots or clotting diseases.  Patient reports that she had knee surgery and gallbladder surgery in 2024 and she did not have any issues with the surgeries.  She otherwise denies any recent surgeries, traumas or infections.  She denies any history of heart attack or stroke.  She denies any history of Crohn's disease or ulcerative colitis, COPD or asthma.  Patient denies any varicosities to her lower extremities.  She denies any recent fevers, chills or changes in her health.  Patient states that she is unsure from Dr. Vernetta standpoint if the nipple is going to be spared or not.  We did discuss the possibility of free nipple graft. We also discussed possible loss of the nipple / nipple not being spared at the time of surgery.  She expressed understanding.  Patient states that she is currently around  a DDD cup and states that she would like to be around a C or D.  I discussed with the patient that her size will be greatly affected by how much tissue is removed from general surgery.  I did discuss with the patient that cup size cannot be guaranteed.  Patient expressed understanding.  Summary of Previous Visit: Patient was initially seen by Dr. Lowery on 03/23/2024.  At this visit, patient reported she had recently been told she had a left breast cancer.  She was planning on left partial mastectomy for invasive ductal carcinoma.  Patient reported she was currently a DDD cup size.  Plan was to move forward with oncoplastic bilateral breast reduction.   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  We will plan for her to take 6 weeks off.  PMH Significant for: Hypertension, GERD, left breast cancer   Past Medical History: Allergies: No Known Allergies  Current Medications:  Current Outpatient Medications:    valsartan  (DIOVAN ) 40 MG tablet, Take 1 tablet (40 mg total) by mouth daily., Disp: 90 tablet, Rfl: 3 No current facility-administered medications for this visit.  Facility-Administered Medications Ordered in Other Visits:    6 CHG cloth bath night before surgery, , , Once **AND** 6 CHG cloth bath AM of surgery, , , Once **AND** Chlorhexidine  Gluconate Cloth 2 % PADS 6 each, 6 each, Topical, Once **AND** Chlorhexidine   Gluconate Cloth 2 % PADS 6 each, 6 each, Topical, Once, Vernetta Berg, MD   feeding supplement (ENSURE PRE-SURGERY) liquid 296 mL, 296 mL, Oral, Once, Vernetta Berg, MD  Past Medical Problems: Past Medical History:  Diagnosis Date   Alcohol use    Arthritis    self report   Gallstones 05/08/2018   Found on US    Gastroesophageal reflux disease 12/20/2018   Headache    Hyperlipidemia    Hypertension    Liver lesion 12/06/2019    Past Surgical History: Past Surgical History:   Procedure Laterality Date   BREAST BIOPSY Left 03/08/2024   MM LT BREAST BX W LOC DEV 1ST LESION IMAGE BX SPEC STEREO GUIDE 03/08/2024 [facility]   BREAST BIOPSY Right 03/08/2024   MM RT BREAST BX W LOC DEV 1ST LESION IMAGE BX SPEC STEREO GUIDE 03/08/2024 [facility]   BREAST BIOPSY Left 03/09/2024   US  LT BREAST BX W LOC DEV 1ST LESION IMG BX SPEC US  GUIDE 03/09/2024 [facility]   CHOLECYSTECTOMY     FOOT SURGERY  2005   FOOT SURGERY Left 2009   KNEE ARTHROCENTESIS  08/2018   TOTAL KNEE ARTHROPLASTY Left 09/01/2021   Procedure: LEFT TOTAL KNEE ARTHROPLASTY;  Surgeon: Sheril Coy, MD;  Location: [facility];  Service: Orthopedics;  Laterality: Left;   TOTAL KNEE ARTHROPLASTY Right 02/23/2022   Procedure: RIGHT TOTAL KNEE ARTHROPLASTY;  Surgeon: Sheril Coy, MD;  Location: [facility];  Service: Orthopedics;  Laterality: Right;   WISDOM TOOTH EXTRACTION      Social History: Social History   Socioeconomic History   Marital status: Significant Other    Spouse name: Not on file   Number of children: Not on file   Years of education: Not on file   Highest education level: Not on file  Occupational History   Not on file  Tobacco Use   Smoking status: Former    Current packs/day: 0.00    Average packs/day: 0.5 packs/day for 40.0 years (20.0 ttl pk-yrs)    Types: Cigarettes    Start date: 12/05/1981    Quit date: 12/05/2021    Years since quitting: 2.3   Smokeless tobacco: Never  Vaping Use   Vaping status: Never Used  Substance and Sexual Activity   Alcohol use: Yes    Alcohol/week: 2.0 standard drinks of alcohol    Types: 2 Cans of beer per week    Comment: socially   Drug use: No    Comment: former cocaine user    Sexual activity: Yes    Partners: Female    Birth control/protection: Post-menopausal  Other Topics Concern   Not on file  Social History Narrative   Not on file   Social Drivers of Health   Financial Resource Strain: Not on file  Food  Insecurity: No Food Insecurity (03/27/2024)   Hunger Vital Sign    Worried About Running Out of Food in the Last Year: Never true    Ran Out of Food in the Last Year: Never true  Transportation Needs: No Transportation Needs (03/27/2024)   PRAPARE - Administrator, Civil Service (Medical): No    Lack of Transportation (Non-Medical): No  Physical Activity: Not on file  Stress: Not on file  Social Connections: Not on file  Intimate Partner Violence: Not At Risk (03/27/2024)   Humiliation, Afraid, Rape, and Kick questionnaire    Fear of Current or Ex-Partner: No    Emotionally Abused: No    Physically  Abused: No    Sexually Abused: No    Family History: Family History  Problem Relation Age of Onset   Diabetes Mother    Thyroid disease Half-Sister    Diabetes Half-Sister    Thyroid disease Half-Sister    Diabetes Half-Brother    Cancer Paternal Aunt        dx >50   Cancer Paternal Aunt        dx >50   Colon polyps Neg Hx    Colon cancer Neg Hx    Esophageal cancer Neg Hx    Rectal cancer Neg Hx    Stomach cancer Neg Hx     Review of Systems: Denies any recent fevers, chills or changes in her health  Physical Exam: Vital Signs BP (!) 158/98   Pulse 86   Ht 5' 7 (1.702 m)   Wt 237 lb (107.5 kg)   SpO2 99%   BMI 37.12 kg/m   Physical Exam  Constitutional:      General: Not in acute distress.    Appearance: Normal appearance. Not ill-appearing.  HENT:     Head: Normocephalic and atraumatic.  Eyes:     Pupils: Pupils are equal, round Neck:     Musculoskeletal: Normal range of motion.  Cardiovascular:     Rate and Rhythm: Normal rate    Pulses: Normal pulses.  Pulmonary:     Effort: Pulmonary effort is normal. No respiratory distress.  Musculoskeletal: Normal range of motion.  Skin:    General: Skin is warm and dry.     Findings: No erythema or rash.  Neurological:      Mental Status: Alert and oriented to person, place, and time. Mental status  is at baseline.  Psychiatric:        Mood and Affect: Mood normal.        Behavior: Behavior normal.    Assessment/Plan: The patient is scheduled for bilateral breast reduction with Dr. Lowery.  Risks, benefits, and alternatives of procedure discussed, questions answered and consent obtained.    Smoking Status: Quit smoking 1 year ago; Counseling Given?  Yes, I discussed with the patient the importance of smoking cessation's.  We discussed that smoking can increase her risk of perioperative complications including wound healing issues.  Patient expressed understanding of this.  I did discuss with her that I will have to discuss this with Dr. Lowery and there is the possibility of delaying surgery from a plastic surgery standpoint.  Patient expressed understanding.  Spoke with Dr. Lowery, she is going to call the patient in regards to her smoking and moving forward with surgery to further discuss.  ADDEND: Spoke with Dr. Lowery again, who tried to call the patient and could not leave a voicemail.  Dr. Lowery does state that she is okay with patient proceeding with surgery at this time.  Last Mammogram: 03/01/2024; Results: BI-RADS Category 5, highly suggestive of malignancy.  Patient then had clip placement to both left and right breasts on 03/08/2024.  She then had biopsies of both the lesions to left and right breast.  Pathology to the right breast showed sclerosing adenosis with calcifications, negative for malignancy.  Pathology to the left breast showed invasive ductal carcinoma.  Caprini Score: 6; Risk Factors include: Age, BMI > 25, history of malignancy and length of planned surgery. Recommendation for mechanical prophylaxis. Encourage early ambulation.   Pictures obtained: @consult   Post-op Rx sent to pharmacy: Oxycodone , Zofran , Keflex -patient was requesting Diflucan  given that she  tends to get yeast infections while taking antibiotics.  Discussed with patient to  hold her valsartan  the morning of surgery.  Patient expressed understanding.  Patient was provided with the breast reduction and General Surgical Risk consent document and Pain Medication Agreement prior to their appointment.  They had adequate time to read through the risk consent documents and Pain Medication Agreement. We also discussed them in person together during this preop appointment. All of their questions were answered to their satisfaction.  Recommended calling if they have any further questions.  Risk consent form and Pain Medication Agreement to be scanned into patient's chart.  The risk that can be encountered with breast reduction were discussed and include the following but not limited to these:  Breast asymmetry, fluid accumulation, firmness of the breast, inability to breast feed, loss of nipple or areola, skin loss, decrease or no nipple sensation, fat necrosis of the breast tissue, bleeding, infection, healing delay.  There are risks of anesthesia, changes to skin sensation and injury to nerves or blood vessels.  The muscle can be temporarily or permanently injured.  You may have an allergic reaction to tape, suture, glue, blood products which can result in skin discoloration, swelling, pain, skin lesions, poor healing.  Any of these can lead to the need for revisonal surgery or stage procedures.  A reduction has potential to interfere with diagnostic procedures.  Nipple or breast piercing can increase risks of infection.  This procedure is best done when the breast is fully developed.  Changes in the breast will continue to occur over time.  Pregnancy can alter the outcomes of previous breast reduction surgery, weight gain and weigh loss can also effect the long term appearance.   We discussed the possibility of amputation/free nipple graft technique due to the length of her STN.  She is understanding of the possibility that we would need to transition from a pedicle technique to a free  nipple graft technique intraoperatively.  We discussed the risks associated with free nipple graft breast reductions, including but not limited to failure of the graft, partial loss of the graft, loss of sensation of bilateral nipple areola, complete loss of the nipple areola graft, inability to breast-feed, postoperative wounds, ongoing wound care.  We also discussed the risks associated with the pedicle technique.  We discussed that with the pedicle technique she could develop nipple areolar necrosis which would result in loss of the nipple, this would also result in ongoing wound care and possible changes in the shape of her breast.      Electronically signed by: Estefana FORBES Peck, PA-C 04/04/2024 12:57 PM

## 2024-04-03 NOTE — Progress Notes (Signed)
 New Breast Cancer Diagnosis: Left Breast   Patient presented with left nipple pain.  Diagnostic mammogram showed grouped pleomorphic calcifications approximately 6 mm in length in the upper outer right breast and no suspicious mass or architectural distortion.  In the left breast, subtle microcalcifications were noted within a spiculated high density mass underlying the marker about the retroareolar breast measuring up to 1.8 cm with subtle microcalcifications measuring 8 mm around the mass.      Histology per Pathology Report: grade 2, Invasive Ductal Carcinoma 03/08/2024  Receptor Status: ER(positive), PR (positive), Her2-neu (negative), Ki-(10%)  Histology per Pathology Report: grade 2, Invasive Ductal Carcinoma 03/09/2024  Receptor Status: ER(positive), PR (positive), Her2-neu (negative), Ki-(5%)   Surgeon and surgical plan, if any:  Dr. Vernetta 03/16/2024 Right now she wants to proceed with breast conservation. I explained to her that this would be with a large lumpectomy that would include the nipple areolar complex as a believe the tumor is growing into this area. We would also need to do a targeted lymph node dissection given her positive lymph node. I discussed the risk of surgery which includes but is not limited to bleeding, infection, the need for further surgery if for the lymph nodes or margins were positive, cardiopulmonary issues with anesthesia, postoperative recovery, excetra. She will also be referred to medical and radiation oncology as well as physical therapy preoperatively and genetics. After further discussion, she would also like to consider oncoplastic reduction so we will also refer her to plastic surgery for their opinion as well.   Medical oncologist, treatment if any:    Family History of Breast/Ovarian/Prostate Cancer:   Lymphedema issues, if any:      Pain issues, if any:     SAFETY ISSUES: Prior radiation?  Pacemaker/ICD?  Possible current pregnancy?  Postmenopausal Is the patient on methotrexate?   Current Complaints / other details:

## 2024-04-04 ENCOUNTER — Encounter: Payer: Self-pay | Admitting: Plastic Surgery

## 2024-04-04 ENCOUNTER — Ambulatory Visit: Admitting: Student

## 2024-04-04 VITALS — BP 158/98 | HR 86 | Ht 67.0 in | Wt 237.0 lb

## 2024-04-04 DIAGNOSIS — C50912 Malignant neoplasm of unspecified site of left female breast: Secondary | ICD-10-CM | POA: Diagnosis not present

## 2024-04-04 DIAGNOSIS — Z170 Estrogen receptor positive status [ER+]: Secondary | ICD-10-CM | POA: Diagnosis not present

## 2024-04-04 DIAGNOSIS — Z87891 Personal history of nicotine dependence: Secondary | ICD-10-CM | POA: Diagnosis not present

## 2024-04-04 MED ORDER — FLUCONAZOLE 150 MG PO TABS: 150.0000 mg | ORAL_TABLET | Freq: Once | ORAL | 0 refills | Status: AC

## 2024-04-04 MED ORDER — CEPHALEXIN 500 MG PO CAPS: 500.0000 mg | ORAL_CAPSULE | Freq: Four times a day (QID) | ORAL | 0 refills | Status: AC

## 2024-04-04 MED ORDER — ONDANSETRON HCL 4 MG PO TABS: 4.0000 mg | ORAL_TABLET | Freq: Three times a day (TID) | ORAL | 0 refills | Status: DC | PRN

## 2024-04-04 MED ORDER — OXYCODONE HCL 5 MG PO TABS: 5.0000 mg | ORAL_TABLET | Freq: Four times a day (QID) | ORAL | 0 refills | Status: DC | PRN

## 2024-04-05 ENCOUNTER — Ambulatory Visit
Admission: RE | Admit: 2024-04-05 | Discharge: 2024-04-05 | Disposition: A | Discharge status: Home / Self Care | Source: Ambulatory Visit | Attending: Radiation Oncology | Admitting: Radiation Oncology

## 2024-04-05 ENCOUNTER — Encounter: Payer: Self-pay | Admitting: Radiation Oncology

## 2024-04-05 VITALS — HR 89 | Temp 97.1°F | Resp 18

## 2024-04-05 DIAGNOSIS — Z1721 Progesterone receptor positive status: Secondary | ICD-10-CM | POA: Insufficient documentation

## 2024-04-05 DIAGNOSIS — Z87891 Personal history of nicotine dependence: Secondary | ICD-10-CM | POA: Diagnosis not present

## 2024-04-05 DIAGNOSIS — E785 Hyperlipidemia, unspecified: Secondary | ICD-10-CM | POA: Diagnosis not present

## 2024-04-05 DIAGNOSIS — K219 Gastro-esophageal reflux disease without esophagitis: Secondary | ICD-10-CM | POA: Diagnosis not present

## 2024-04-05 DIAGNOSIS — Z170 Estrogen receptor positive status [ER+]: Secondary | ICD-10-CM | POA: Diagnosis not present

## 2024-04-05 DIAGNOSIS — I10 Essential (primary) hypertension: Secondary | ICD-10-CM | POA: Diagnosis not present

## 2024-04-05 DIAGNOSIS — Z1732 Human epidermal growth factor receptor 2 negative status: Secondary | ICD-10-CM | POA: Insufficient documentation

## 2024-04-05 DIAGNOSIS — C50112 Malignant neoplasm of central portion of left female breast: Secondary | ICD-10-CM | POA: Diagnosis present

## 2024-04-05 DIAGNOSIS — Z79899 Other long term (current) drug therapy: Secondary | ICD-10-CM | POA: Diagnosis not present

## 2024-04-05 DIAGNOSIS — R030 Elevated blood-pressure reading, without diagnosis of hypertension: Secondary | ICD-10-CM | POA: Diagnosis not present

## 2024-04-05 DIAGNOSIS — M129 Arthropathy, unspecified: Secondary | ICD-10-CM | POA: Insufficient documentation

## 2024-04-05 DIAGNOSIS — Z803 Family history of malignant neoplasm of breast: Secondary | ICD-10-CM | POA: Diagnosis not present

## 2024-04-06 ENCOUNTER — Telehealth: Payer: Self-pay | Admitting: Internal Medicine

## 2024-04-06 ENCOUNTER — Ambulatory Visit: Payer: Self-pay

## 2024-04-06 ENCOUNTER — Ambulatory Visit
Admission: RE | Admit: 2024-04-06 | Discharge: 2024-04-06 | Disposition: A | Discharge status: Home / Self Care | Source: Ambulatory Visit | Attending: Surgery | Admitting: Surgery

## 2024-04-06 DIAGNOSIS — C50912 Malignant neoplasm of unspecified site of left female breast: Secondary | ICD-10-CM

## 2024-04-06 DIAGNOSIS — Z853 Personal history of malignant neoplasm of breast: Secondary | ICD-10-CM

## 2024-04-06 HISTORY — PX: BREAST BIOPSY: SHX20

## 2024-04-06 NOTE — Telephone Encounter (Signed)
 Pt has surgery scheduled for Monday and she is nervous and everything she has going on she thinks its just her bp is elevated because of that. She will double up on the 40mg  and then follow-up with us  after surgery

## 2024-04-06 NOTE — Telephone Encounter (Signed)
 Copied from CRM [id number]. Topic: Clinical - Medical Advice >> Apr 06, 2024 10:32 AM Travis F wrote: Reason for CRM: Patient is calling in because she has been dealing with high blood pressure the last few weeks. Patient says she feels fine, but wants to know if her blood pressure medication being lowered could be a part of the reason. Patient says she concerned her surgery won't be approved if she continues to have high blood pressure. Patient is requesting a call back from Jaesean Litzau. Patient says she is currently at the doctor's office, but if someone calls and she misses it she will call back.

## 2024-04-06 NOTE — Telephone Encounter (Signed)
 FYI Only or Action Required?: Action required by provider: update on patient condition and Please provide recommendation for elevated BP- pt has SURG/ONC Monday.  Patient was last seen in primary care on 02/16/2024 by Bulah Alm RAMAN, PA-C.  Called Nurse Triage reporting Hypertension.  Symptoms began a week ago.  Interventions attempted: Prescription medications: valsartan  and Rest, hydration, or home remedies.  Symptoms are: stable.  Triage Disposition: See PCP When Office is Open (Within 3 Days)  Patient/caregiver understands and will follow disposition?: No, wishes to speak with PCP  Copied from CRM #[id number]. Topic: Clinical - Red Word Triage >> Apr 06, 2024 11:17 AM Gustabo D wrote: BP 178/110 Pt says she's on bp medication and he bp keeps going up and she doesn't understand way. Reason for Disposition  Systolic BP >= 160 OR Diastolic >= 100  Answer Assessment - Initial Assessment Questions Just diagnosed with Stage 2 Cancer- 03/08/24. Constant appointments and getting lab work. Her stress and emotions have been very elevated. Surgery is on Monday Valsartan  was decreased down in September from 80mg  Daily to 40mg . She is concerned that her BP at all her appointments have been elevated and concerned it keeps going up. Unsure if White Coat syndrome, stress from appts but making her more stressed the more its elevated.  Cutting back on smoking especially now, and does not drink ETOH often.   Her surgery is on Monday- she is going to check for her home BP cuff and get a reading and let us  know if elevated at home as well. She is assymptomatic currently. ED/UC precautions given.   Discussed speaking with Oncology about therapist to support her during this new scary time. She has decent support of her partner but knows its a lot to handle for all involved. She feels angry and upset about her dx. Validated feelings. Discussed 76* and options for reaching out. Denies SI. Patient  grateful  Wanting to know if provider could give recommendations for adjusting her meds or if above a certain level, should she take an extra dose or half dose of the valsartan . Please advise.   1. BLOOD PRESSURE: What is your blood pressure? Did you take at least two measurements 5 minutes apart?     178/110 at the office yesterday 2. ONSET: When did you take your blood pressure?     At her appts for Oncology 3. HOW: How did you take your blood pressure? (e.g., automatic home BP monitor, visiting nurse)     Cuff in the office 4. HISTORY: Do you have a history of high blood pressure?     HTN  5. MEDICINES: Are you taking any medicines for blood pressure? Have you missed any doses recently?     Valsartan - 40mg  daily- has been decreased from 80mg  to 40mg  Daily in September.  6. OTHER SYMPTOMS: Do you have any symptoms? (e.g., blurred vision, chest pain, difficulty breathing, headache, weakness)     denies  Protocols used: Blood Pressure - High-A-AH

## 2024-04-06 NOTE — Telephone Encounter (Signed)
 Pt was notified.

## 2024-04-07 NOTE — H&P (Addendum)
 REFERRING PHYSICIAN: Tysinger, Alm Heritage, * PROVIDER: VICENTA DASIE POLI, MD MRN: [id number] DOB: 18-Oct-1965  Subjective   Chief Complaint: New Consultation (Rt breast-SCLEROSING ADENOSIS WITH CALCIFICATIONS)  History of Present Illness: Kathryn Delgado is a 58 y.o. female who is seenas an office consultation for evaluation of New Consultation   This is a 58 year old female who has not had a mammogram in several years who presented initially with pain underneath the areola of the left breast. She underwent mammograms and ultrasound. She had a small mass in the right breast which was biopsied and benign. She had 2 separate masses in the left breast. 1 measured 1.9 cm underneath the nipple areolar complex and the other measured 8 mm and was 5 to 6 cm from the nipple. Both masses were biopsied and showed invasive ductal carcinoma. Enlarged lymph node in the axilla was also biopsied showing invasive ductal carcinoma. The subareolar mass was 95% ER positive, 5% PR positive, HER2 negative, and a Ki-67 of 10%. The other mass showed 100% ER/PR positivity, was HER2 negative, and had a Ki-67 of 5%. She denies nipple discharge. She has had no previous problems regarding her breast. She has heavy smoker. There is a family history of breast cancer and multiple aunts on her father side of the family. She has had no cardiopulmonary issues and has had no problems with previous general anesthesia.  Review of Systems: A complete review of systems was obtained from the patient. I have reviewed this information and discussed as appropriate with the patient. See HPI as well for other ROS.  ROS   Medical History: Past Medical History:  Diagnosis Date  Arthritis   There is no problem list on file for this patient.  Past Surgical History:  Procedure Laterality Date  JOINT REPLACEMENT    No Known Allergies  Current Outpatient Medications on File Prior to Visit  Medication Sig Dispense Refill  valsartan   (DIOVAN ) 40 MG tablet Take 40 mg by mouth once daily   No current facility-administered medications on file prior to visit.   Family History  Problem Relation Age of Onset  Diabetes Mother  Diabetes Sister  Diabetes Brother    Social History   Tobacco Use  Smoking Status Every Day  Current packs/day: 0.50  Types: Cigarettes  Smokeless Tobacco Never    Social History   Socioeconomic History  Marital status: Life Partner  Tobacco Use  Smoking status: Every Day  Current packs/day: 0.50  Types: Cigarettes  Smokeless tobacco: Never  Substance and Sexual Activity  Drug use: Never   Social Drivers of Health   Food Insecurity: No Food Insecurity (02/23/2022)  Received from [facility]  Hunger Vital Sign  Within the past 12 months, you worried that your food would run out before you got the money to buy more.: Never true  Within the past 12 months, the food you bought just didn't last and you didn't have money to get more.: Never true  Transportation Needs: No Transportation Needs (02/23/2022)  Received from [facility] - Transportation  Lack of Transportation (Medical): No  Lack of Transportation (Non-Medical): No  Housing Stability: Unknown (03/16/2024)  Housing Stability Vital Sign  Homeless in the Last Year: No   Objective:   Vitals:  03/16/24 1022  BP: (!) 162/102  Pulse: 80  Temp: 36.1 C (97 F)  SpO2: 97%  Weight: (!) 107 kg (236 lb)  Height: 170.2 cm (5' 7)   Body mass index is 36.96 kg/m.  Physical Exam   She appears well on exam.  A chaperone was present for the exam.  I cannot palpate is a masses in the left breast although she is slightly fuller underneath the nipple but there is no nipple retraction on the left. I cannot palpate the axillary adenopathy as well.  There are no abnormalities of the right breast.  Labs, Imaging and Diagnostic Testing: I have reviewed her mammograms, ultrasound, and pathology results  Assessment and  Plan:   Diagnoses and all orders for this visit:  Invasive ductal carcinoma of breast, left (CMS/HHS-HCC) - Ambulatory Referral to Oncology-Medical - Ambulatory Referral to Radiation Oncology - Ambulatory Referral to Plastic Surgery - Ambulatory Referral to Physical Therapy - Ambulatory Referral to Cancer Genetics - [id number]  Neoplasm of left breast, regional lymph node staging category pN0(mol+): positive molecular findings without regional lymph node metastasis detected by histology or immunohistochemistry (CMS/HHS-HCC) - Ambulatory Referral to Oncology-Medical - Ambulatory Referral to Radiation Oncology - Ambulatory Referral to Plastic Surgery - Ambulatory Referral to Physical Therapy - Ambulatory Referral to Cancer Genetics - [phone]   I discussed the pathology findings with the patient and her significant other. We discussed the multidisciplinary approach to breast cancer. From a surgical standpoint we then discussed breast conservation versus mastectomy. Right now she wants to proceed with breast conservation. I explained to her that this would be with a large lumpectomy that would include the nipple areolar complex as a believe the tumor is growing into this area. We would also need to do a targeted lymph node dissection given her positive lymph node. I discussed the risk of surgery which includes but is not limited to bleeding, infection, the need for further surgery if for the lymph nodes or margins were positive, cardiopulmonary issues with anesthesia, postoperative recovery, excetra. She will also be referred to medical and radiation oncology as well as physical therapy preoperatively and genetics. After further discussion, she would also like to consider oncoplastic reduction so we will also refer her to plastic surgery for their opinion as well. A thorough discussion, she wishes to proceed with the above listed plans.  Addendum: She has seen plastic surgery and the plan will be to  proceed with bilateral oncoplastic breast reduction at the time of her surgery.  Again, I will need to take the the left nipple areolar complex given the location of the malignancy

## 2024-04-09 ENCOUNTER — Ambulatory Visit (HOSPITAL_BASED_OUTPATIENT_CLINIC_OR_DEPARTMENT_OTHER)
Admission: RE | Admit: 2024-04-09 | Discharge: 2024-04-09 | Disposition: A | Discharge status: Home / Self Care | Attending: Surgery | Admitting: Surgery

## 2024-04-09 ENCOUNTER — Encounter (HOSPITAL_BASED_OUTPATIENT_CLINIC_OR_DEPARTMENT_OTHER): Payer: Self-pay | Admitting: Surgery

## 2024-04-09 ENCOUNTER — Ambulatory Visit (HOSPITAL_BASED_OUTPATIENT_CLINIC_OR_DEPARTMENT_OTHER): Payer: Self-pay | Admitting: Anesthesiology

## 2024-04-09 ENCOUNTER — Ambulatory Visit
Admission: RE | Admit: 2024-04-09 | Discharge: 2024-04-09 | Disposition: A | Discharge status: Home / Self Care | Source: Ambulatory Visit | Attending: Surgery | Admitting: Surgery

## 2024-04-09 ENCOUNTER — Ambulatory Visit
Admission: RE | Admit: 2024-04-09 | Discharge: 2024-04-09 | Disposition: A | Source: Ambulatory Visit | Attending: Surgery | Admitting: Surgery

## 2024-04-09 ENCOUNTER — Encounter (HOSPITAL_BASED_OUTPATIENT_CLINIC_OR_DEPARTMENT_OTHER)
Admission: RE | Disposition: A | Discharge status: Home / Self Care | Payer: Self-pay | Source: Home / Self Care | Attending: Surgery

## 2024-04-09 ENCOUNTER — Other Ambulatory Visit: Payer: Self-pay

## 2024-04-09 DIAGNOSIS — M542 Cervicalgia: Secondary | ICD-10-CM | POA: Diagnosis not present

## 2024-04-09 DIAGNOSIS — D0512 Intraductal carcinoma in situ of left breast: Secondary | ICD-10-CM | POA: Insufficient documentation

## 2024-04-09 DIAGNOSIS — Z853 Personal history of malignant neoplasm of breast: Secondary | ICD-10-CM

## 2024-04-09 DIAGNOSIS — C50912 Malignant neoplasm of unspecified site of left female breast: Secondary | ICD-10-CM

## 2024-04-09 DIAGNOSIS — N62 Hypertrophy of breast: Secondary | ICD-10-CM | POA: Insufficient documentation

## 2024-04-09 DIAGNOSIS — Z01818 Encounter for other preprocedural examination: Secondary | ICD-10-CM

## 2024-04-09 DIAGNOSIS — M549 Dorsalgia, unspecified: Secondary | ICD-10-CM | POA: Insufficient documentation

## 2024-04-09 DIAGNOSIS — F17210 Nicotine dependence, cigarettes, uncomplicated: Secondary | ICD-10-CM | POA: Diagnosis not present

## 2024-04-09 HISTORY — PX: BREAST REDUCTION SURGERY: SHX8

## 2024-04-09 HISTORY — PX: BREAST LUMPECTOMY WITH RADIOACTIVE SEED AND AXILLARY LYMPH NODE DISSECTION: SHX6656

## 2024-04-09 SURGERY — BREAST LUMPECTOMY WITH RADIOACTIVE SEED AND AXILLARY LYMPH NODE DISSECTION
Anesthesia: General | Site: Breast | Laterality: Left

## 2024-04-09 MED ORDER — FENTANYL CITRATE (PF) 100 MCG/2ML IJ SOLN
INTRAMUSCULAR | Status: AC
  Filled 2024-04-09: qty 2

## 2024-04-09 MED ORDER — EPINEPHRINE PF 1 MG/ML IJ SOLN
INTRAMUSCULAR | Status: AC
  Filled 2024-04-09: qty 1

## 2024-04-09 MED ORDER — BUPIVACAINE HCL (PF) 0.25 % IJ SOLN
INTRAMUSCULAR | Status: AC
  Filled 2024-04-09: qty 30

## 2024-04-09 MED ORDER — OXYCODONE HCL 5 MG PO TABS: 5.0000 mg | ORAL_TABLET | ORAL | Status: DC | PRN

## 2024-04-09 MED ORDER — KETOROLAC TROMETHAMINE 30 MG/ML IJ SOLN
INTRAMUSCULAR | Status: AC
  Filled 2024-04-09: qty 2

## 2024-04-09 MED ORDER — MIDAZOLAM HCL 2 MG/2ML IJ SOLN
INTRAMUSCULAR | Status: AC
  Filled 2024-04-09: qty 2

## 2024-04-09 MED ORDER — FENTANYL CITRATE (PF) 100 MCG/2ML IJ SOLN
25.0000 ug | INTRAMUSCULAR | Status: DC | PRN
  Administered 2024-04-09 (×2): 50 ug via INTRAVENOUS

## 2024-04-09 MED ORDER — HYDROMORPHONE HCL 1 MG/ML IJ SOLN
INTRAMUSCULAR | Status: DC | PRN
  Administered 2024-04-09: .5 mg via INTRAVENOUS

## 2024-04-09 MED ORDER — PROPOFOL 10 MG/ML IV BOLUS
INTRAVENOUS | Status: DC | PRN
  Administered 2024-04-09: 20 mg via INTRAVENOUS
  Administered 2024-04-09: 30 mg via INTRAVENOUS
  Administered 2024-04-09: 20 mg via INTRAVENOUS
  Administered 2024-04-09: 150 mg via INTRAVENOUS

## 2024-04-09 MED ORDER — FENTANYL CITRATE (PF) 100 MCG/2ML IJ SOLN
100.0000 ug | Freq: Once | INTRAMUSCULAR | Status: AC
  Administered 2024-04-09: 50 ug via INTRAVENOUS

## 2024-04-09 MED ORDER — CEFAZOLIN SODIUM-DEXTROSE 2-4 GM/100ML-% IV SOLN
INTRAVENOUS | Status: AC
  Filled 2024-04-09: qty 100

## 2024-04-09 MED ORDER — BUPIVACAINE LIPOSOME 1.3 % IJ SUSP
INTRAMUSCULAR | Status: DC | PRN
Start: 2024-04-09 — End: 2024-04-09
  Administered 2024-04-09: 20 mL

## 2024-04-09 MED ORDER — VASHE WOUND IRRIGATION OPTIME
TOPICAL | Status: DC | PRN
  Administered 2024-04-09: 34 [oz_av]

## 2024-04-09 MED ORDER — BUPIVACAINE HCL (PF) 0.25 % IJ SOLN
INTRAMUSCULAR | Status: DC | PRN
  Administered 2024-04-09: 30 mL

## 2024-04-09 MED ORDER — LIDOCAINE-EPINEPHRINE 1 %-1:100000 IJ SOLN
INTRAMUSCULAR | Status: DC | PRN
  Administered 2024-04-09: 30 mL

## 2024-04-09 MED ORDER — DROPERIDOL 2.5 MG/ML IJ SOLN
INTRAMUSCULAR | Status: AC
  Filled 2024-04-09: qty 2

## 2024-04-09 MED ORDER — DROPERIDOL 2.5 MG/ML IJ SOLN
0.6250 mg | Freq: Once | INTRAMUSCULAR | Status: AC | PRN
  Administered 2024-04-09: 0.625 mg via INTRAVENOUS

## 2024-04-09 MED ORDER — SODIUM CHLORIDE 0.9% FLUSH: 3.0000 mL | Freq: Two times a day (BID) | INTRAVENOUS | Status: DC

## 2024-04-09 MED ORDER — ACETAMINOPHEN 500 MG PO TABS
ORAL_TABLET | ORAL | Status: AC
  Filled 2024-04-09: qty 2

## 2024-04-09 MED ORDER — DEXAMETHASONE SOD PHOSPHATE PF 10 MG/ML IJ SOLN
INTRAMUSCULAR | Status: DC | PRN
  Administered 2024-04-09: 10 mg via INTRAVENOUS

## 2024-04-09 MED ORDER — HYDROMORPHONE HCL 1 MG/ML IJ SOLN
INTRAMUSCULAR | Status: AC
  Filled 2024-04-09: qty 0.5

## 2024-04-09 MED ORDER — FENTANYL CITRATE (PF) 100 MCG/2ML IJ SOLN: 25.0000 ug | INTRAMUSCULAR | Status: DC | PRN

## 2024-04-09 MED ORDER — ACETAMINOPHEN 325 MG RE SUPP: 650.0000 mg | RECTAL | Status: DC | PRN

## 2024-04-09 MED ORDER — OXYCODONE HCL 5 MG/5ML PO SOLN: 5.0000 mg | Freq: Once | ORAL | Status: DC | PRN

## 2024-04-09 MED ORDER — OXYCODONE HCL 5 MG PO TABS: 5.0000 mg | ORAL_TABLET | Freq: Once | ORAL | Status: DC | PRN

## 2024-04-09 MED ORDER — ROCURONIUM BROMIDE 10 MG/ML (PF) SYRINGE
PREFILLED_SYRINGE | INTRAVENOUS | Status: AC
  Filled 2024-04-09: qty 10

## 2024-04-09 MED ORDER — LIDOCAINE 2% (20 MG/ML) 5 ML SYRINGE
INTRAMUSCULAR | Status: AC
  Filled 2024-04-09: qty 5

## 2024-04-09 MED ORDER — MIDAZOLAM HCL (PF) 2 MG/2ML IJ SOLN
INTRAMUSCULAR | Status: DC | PRN
  Administered 2024-04-09: 2 mg via INTRAVENOUS

## 2024-04-09 MED ORDER — MIDAZOLAM HCL (PF) 2 MG/2ML IJ SOLN
2.0000 mg | Freq: Once | INTRAMUSCULAR | Status: AC
  Administered 2024-04-09: 2 mg via INTRAVENOUS

## 2024-04-09 MED ORDER — CEFAZOLIN SODIUM-DEXTROSE 3-4 GM/150ML-% IV SOLN: 3.0000 g | INTRAVENOUS | Status: DC

## 2024-04-09 MED ORDER — ACETAMINOPHEN 500 MG PO TABS: 1000.0000 mg | ORAL_TABLET | Freq: Once | ORAL | Status: DC

## 2024-04-09 MED ORDER — NALOXONE HCL 0.4 MG/ML IJ SOLN
INTRAMUSCULAR | Status: AC
  Filled 2024-04-09: qty 1

## 2024-04-09 MED ORDER — MAGTRACE LYMPHATIC TRACER
INTRAMUSCULAR | Status: DC | PRN
  Administered 2024-04-09: 2 mL via INTRAMUSCULAR

## 2024-04-09 MED ORDER — SODIUM CHLORIDE 0.9% FLUSH: 3.0000 mL | INTRAVENOUS | Status: DC | PRN

## 2024-04-09 MED ORDER — CEFAZOLIN SODIUM-DEXTROSE 2-3 GM-%(50ML) IV SOLR
INTRAVENOUS | Status: DC | PRN
  Administered 2024-04-09: 2 g via INTRAVENOUS

## 2024-04-09 MED ORDER — LACTATED RINGERS IV SOLN: INTRAVENOUS | Status: DC

## 2024-04-09 MED ORDER — ONDANSETRON HCL 4 MG/2ML IJ SOLN
INTRAMUSCULAR | Status: AC
  Filled 2024-04-09: qty 2

## 2024-04-09 MED ORDER — SUGAMMADEX SODIUM 200 MG/2ML IV SOLN
INTRAVENOUS | Status: DC | PRN
  Administered 2024-04-09: 200 mg via INTRAVENOUS

## 2024-04-09 MED ORDER — ROCURONIUM BROMIDE 10 MG/ML (PF) SYRINGE
PREFILLED_SYRINGE | INTRAVENOUS | Status: DC | PRN
  Administered 2024-04-09: 60 mg via INTRAVENOUS

## 2024-04-09 MED ORDER — SODIUM CHLORIDE 0.9 % IV SOLN: 250.0000 mL | INTRAVENOUS | Status: DC | PRN

## 2024-04-09 MED ORDER — METHYLENE BLUE 20 MG/2ML IV SOSY
PREFILLED_SYRINGE | INTRAVENOUS | Status: AC
  Filled 2024-04-09: qty 2

## 2024-04-09 MED ORDER — LIDOCAINE-EPINEPHRINE 1 %-1:100000 IJ SOLN
INTRAMUSCULAR | Status: AC
  Filled 2024-04-09: qty 1

## 2024-04-09 MED ORDER — BUPIVACAINE LIPOSOME 1.3 % IJ SUSP
INTRAMUSCULAR | Status: AC
  Filled 2024-04-09: qty 20

## 2024-04-09 MED ORDER — PROPOFOL 10 MG/ML IV BOLUS
INTRAVENOUS | Status: AC
  Filled 2024-04-09: qty 20

## 2024-04-09 MED ORDER — FENTANYL CITRATE (PF) 100 MCG/2ML IJ SOLN
INTRAMUSCULAR | Status: DC | PRN
  Administered 2024-04-09 (×3): 50 ug via INTRAVENOUS
  Administered 2024-04-09: 100 ug via INTRAVENOUS
  Administered 2024-04-09: 50 ug via INTRAVENOUS

## 2024-04-09 MED ORDER — LIDOCAINE HCL 1 % IJ SOLN
INTRAVENOUS | Status: DC | PRN
  Administered 2024-04-09: 200 mL

## 2024-04-09 MED ORDER — LIDOCAINE 2% (20 MG/ML) 5 ML SYRINGE
INTRAMUSCULAR | Status: DC | PRN
  Administered 2024-04-09: 100 mg via INTRAVENOUS

## 2024-04-09 MED ORDER — 0.9 % SODIUM CHLORIDE (POUR BTL) OPTIME
TOPICAL | Status: DC | PRN
  Administered 2024-04-09: 1000 mL

## 2024-04-09 MED ORDER — PHENYLEPHRINE 80 MCG/ML (10ML) SYRINGE FOR IV PUSH (FOR BLOOD PRESSURE SUPPORT)
PREFILLED_SYRINGE | INTRAVENOUS | Status: DC | PRN
  Administered 2024-04-09: 40 ug via INTRAVENOUS
  Administered 2024-04-09 (×2): 80 ug via INTRAVENOUS

## 2024-04-09 MED ORDER — ONDANSETRON HCL 4 MG/2ML IJ SOLN
INTRAMUSCULAR | Status: DC | PRN
  Administered 2024-04-09: 4 mg via INTRAVENOUS

## 2024-04-09 MED ORDER — BUPIVACAINE-EPINEPHRINE (PF) 0.5% -1:200000 IJ SOLN
INTRAMUSCULAR | Status: AC
  Filled 2024-04-09: qty 30

## 2024-04-09 MED ORDER — ACETAMINOPHEN 325 MG PO TABS: 650.0000 mg | ORAL_TABLET | ORAL | Status: DC | PRN

## 2024-04-09 MED ORDER — ACETAMINOPHEN 500 MG PO TABS
1000.0000 mg | ORAL_TABLET | ORAL | Status: AC
  Administered 2024-04-09: 1000 mg via ORAL

## 2024-04-09 SURGICAL SUPPLY — 80 items
BINDER BREAST LRG (GAUZE/BANDAGES/DRESSINGS) IMPLANT
BINDER BREAST MEDIUM (GAUZE/BANDAGES/DRESSINGS) IMPLANT
BINDER BREAST XLRG (GAUZE/BANDAGES/DRESSINGS) IMPLANT
BINDER BREAST XXLRG (GAUZE/BANDAGES/DRESSINGS) IMPLANT
BIOPATCH RED 1 DISK 7.0 (GAUZE/BANDAGES/DRESSINGS) IMPLANT
BLADE HEX COATED 2.75 (ELECTRODE) IMPLANT
BLADE KNIFE PERSONA 10 (BLADE) ×4 IMPLANT
BLADE SURG 15 STRL LF DISP TIS (BLADE) ×2 IMPLANT
CANISTER SUCT 1200ML W/VALVE (MISCELLANEOUS) ×2 IMPLANT
CHLORAPREP W/TINT 26 (MISCELLANEOUS) ×2 IMPLANT
CLEANSER WND VASHE 34 (WOUND CARE) ×2 IMPLANT
CLIP APPLIE 9.375 MED OPEN (MISCELLANEOUS) ×2 IMPLANT
CLIP TI WIDE RED SMALL 6 (CLIP) IMPLANT
COTTONBALL LRG STERILE PKG (GAUZE/BANDAGES/DRESSINGS) IMPLANT
COVER BACK TABLE 60X90IN (DRAPES) ×2 IMPLANT
COVER MAYO STAND STRL (DRAPES) ×2 IMPLANT
COVER PROBE CYLINDRICAL 5X96 (MISCELLANEOUS) ×2 IMPLANT
DERMABOND ADVANCED .7 DNX12 (GAUZE/BANDAGES/DRESSINGS) ×4 IMPLANT
DRAIN CHANNEL 15F RND FF W/TCR (WOUND CARE) IMPLANT
DRAIN CHANNEL 19F RND (DRAIN) IMPLANT
DRAPE LAPAROSCOPIC ABDOMINAL (DRAPES) ×2 IMPLANT
DRAPE UTILITY XL STRL (DRAPES) ×2 IMPLANT
DRESSING MEPILEX FLEX 4X4 (GAUZE/BANDAGES/DRESSINGS) IMPLANT
DRSG MEPILEX POST OP 4X8 (GAUZE/BANDAGES/DRESSINGS) ×4 IMPLANT
DRSG TEGADERM 4X4.75 (GAUZE/BANDAGES/DRESSINGS) IMPLANT
DRSG TELFA 3X8 NADH STRL (GAUZE/BANDAGES/DRESSINGS) IMPLANT
ELECTRODE BLDE 4.0 EZ CLN MEGD (MISCELLANEOUS) ×2 IMPLANT
ELECTRODE REM PT RTRN 9FT ADLT (ELECTROSURGICAL) ×2 IMPLANT
EVACUATOR SILICONE 100CC (DRAIN) IMPLANT
GAUZE PAD ABD 8X10 STRL (GAUZE/BANDAGES/DRESSINGS) ×4 IMPLANT
GAUZE SPONGE 4X4 12PLY STRL LF (GAUZE/BANDAGES/DRESSINGS) IMPLANT
GAUZE XEROFORM 5X9 LF (GAUZE/BANDAGES/DRESSINGS) IMPLANT
GLOVE BIO SURGEON STRL SZ 6.5 (GLOVE) ×4 IMPLANT
GLOVE BIO SURGEON STRL SZ7.5 (GLOVE) IMPLANT
GLOVE BIOGEL PI IND STRL 7.0 (GLOVE) IMPLANT
GLOVE BIOGEL PI IND STRL 8 (GLOVE) IMPLANT
GLOVE SURG SIGNA 7.5 PF LTX (GLOVE) ×2 IMPLANT
GOWN STRL REUS W/ TWL LRG LVL3 (GOWN DISPOSABLE) ×4 IMPLANT
GOWN STRL REUS W/ TWL XL LVL3 (GOWN DISPOSABLE) ×2 IMPLANT
KIT MARKER MARGIN INK (KITS) ×2 IMPLANT
LINER CANISTER 1000CC FLEX (MISCELLANEOUS) ×2 IMPLANT
NDL FILTER BLUNT 18X1 1/2 (NEEDLE) IMPLANT
NDL HYPO 25X1 1.5 SAFETY (NEEDLE) ×4 IMPLANT
NDL SAFETY ECLIPSE 18X1.5 (NEEDLE) ×2 IMPLANT
NEEDLE FILTER BLUNT 18X1 1/2 (NEEDLE) ×2 IMPLANT
NEEDLE HYPO 25X1 1.5 SAFETY (NEEDLE) ×4 IMPLANT
PACK BASIN DAY SURGERY FS (CUSTOM PROCEDURE TRAY) ×2 IMPLANT
PAD ALCOHOL SWAB (MISCELLANEOUS) IMPLANT
PAD FOAM SILICONE BACKED (GAUZE/BANDAGES/DRESSINGS) IMPLANT
PENCIL SMOKE EVACUATOR (MISCELLANEOUS) ×2 IMPLANT
PIN SAFETY STERILE (MISCELLANEOUS) IMPLANT
POWDER MYRIAD MORCLLS FINE 500 (Miscellaneous) IMPLANT
SLEEVE SCD COMPRESS KNEE MED (STOCKING) ×2 IMPLANT
SOL PREP POV-IOD 4OZ 10% (MISCELLANEOUS) ×2 IMPLANT
SOLN 0.9% NACL POUR BTL 1000ML (IV SOLUTION) ×2 IMPLANT
SPIKE FLUID TRANSFER (MISCELLANEOUS) IMPLANT
SPONGE T-LAP 18X18 ~~LOC~~+RFID (SPONGE) ×4 IMPLANT
SPONGE T-LAP 4X18 ~~LOC~~+RFID (SPONGE) ×2 IMPLANT
STRIP SUTURE WOUND CLOSURE 1/2 (MISCELLANEOUS) ×8 IMPLANT
SUT MNCRL AB 4-0 PS2 18 (SUTURE) ×8 IMPLANT
SUT MON AB 3-0 SH27 (SUTURE) ×8 IMPLANT
SUT MON AB 5-0 PS2 18 (SUTURE) IMPLANT
SUT PDS II 3-0 CT2 27 ABS (SUTURE) ×8 IMPLANT
SUT SILK 2 0 SH (SUTURE) IMPLANT
SUT SILK 3 0 PS 1 (SUTURE) IMPLANT
SUT VIC AB 3-0 SH 27X BRD (SUTURE) ×2 IMPLANT
SUT VIC AB 4-0 PS2 27 (SUTURE) IMPLANT
SYR 50ML LL SCALE MARK (SYRINGE) IMPLANT
SYR BULB IRRIG 60ML STRL (SYRINGE) ×2 IMPLANT
SYR CONTROL 10ML LL (SYRINGE) ×4 IMPLANT
TAPE MEASURE VINYL STERILE (MISCELLANEOUS) IMPLANT
TOWEL GREEN STERILE FF (TOWEL DISPOSABLE) ×6 IMPLANT
TRACER MAGTRACE VIAL (MISCELLANEOUS) IMPLANT
TRAY DSU PREP LF (CUSTOM PROCEDURE TRAY) ×2 IMPLANT
TRAY FAXITRON CT DISP (TRAY / TRAY PROCEDURE) ×2 IMPLANT
TUBE CONNECTING 20X1/4 (TUBING) ×2 IMPLANT
TUBING INFILTRATION IT-10001 (TUBING) IMPLANT
TUBING SET GRADUATE ASPIR 12FT (MISCELLANEOUS) IMPLANT
UNDERPAD 30X36 HEAVY ABSORB (UNDERPADS AND DIAPERS) ×4 IMPLANT
YANKAUER SUCT BULB TIP NO VENT (SUCTIONS) ×2 IMPLANT

## 2024-04-09 NOTE — Anesthesia Preprocedure Evaluation (Signed)
 Anesthesia Evaluation  Patient identified by MRN, date of birth, ID band Patient awake    Reviewed: Allergy & Precautions, NPO status , Patient's Chart, lab work & pertinent test results  History of Anesthesia Complications Negative for: history of anesthetic complications  Airway Mallampati: II  TM Distance: >3 FB Neck ROM: Full    Dental no notable dental hx. (+) Teeth Intact   Pulmonary neg pulmonary ROS, neg sleep apnea, neg COPD, Patient abstained from smoking.Not current smoker, former smoker   Pulmonary exam normal breath sounds clear to auscultation       Cardiovascular Exercise Tolerance: Good METShypertension, Pt. on medications (-) CAD and (-) Past MI (-) dysrhythmias  Rhythm:Regular Rate:Normal - Systolic murmurs    Neuro/Psych  Headaches  negative psych ROS   GI/Hepatic ,GERD  Controlled,,(+)     (-) substance abuse    Endo/Other  neg diabetes    Renal/GU negative Renal ROS     Musculoskeletal   Abdominal  (+) + obese  Peds  Hematology   Anesthesia Other Findings Past Medical History: No date: Alcohol use No date: Arthritis     Comment:  self report 05/08/2018: Gallstones     Comment:  Found on US  12/20/2018: Gastroesophageal reflux disease No date: Headache No date: Hyperlipidemia No date: Hypertension 12/06/2019: Liver lesion  Reproductive/Obstetrics                              Anesthesia Physical Anesthesia Plan  ASA: 2  Anesthesia Plan: General   Post-op Pain Management: Regional block* and Tylenol  PO (pre-op)*   Induction: Intravenous  PONV Risk Score and Plan: 4 or greater and Ondansetron , Dexamethasone  and Midazolam   Airway Management Planned: Oral ETT  Additional Equipment: None  Intra-op Plan:   Post-operative Plan: Extubation in OR  Informed Consent: I have reviewed the patients History and Physical, chart, labs and discussed the procedure  including the risks, benefits and alternatives for the proposed anesthesia with the patient or authorized representative who has indicated his/her understanding and acceptance.     Dental advisory given  Plan Discussed with: CRNA and Surgeon  Anesthesia Plan Comments: (Discussed risks of anesthesia with patient, including PONV, sore throat, lip/dental/eye damage. Rare risks discussed as well, such as cardiorespiratory and neurological sequelae, and allergic reactions. Discussed the role of CRNA in patient's perioperative care. Patient understands. Discussed r/b/a of  PECSII nerve block, including:  - bleeding, infection, nerve damage - pneumothorax - poor or non functioning block. - reactions and toxicity to local anesthetic Patient understands. )        Anesthesia Quick Evaluation

## 2024-04-09 NOTE — Interval H&P Note (Signed)
 History and Physical Interval Note:  04/09/2024 11:58 AM  Kathryn Delgado  has presented today for surgery, with the diagnosis of LEFT BREAST CANCER.  The various methods of treatment have been discussed with the patient and family. After consideration of risks, benefits and other options for treatment, the patient has consented to  Procedure(s) with comments: BREAST LUMPECTOMY WITH RADIOACTIVE SEED AND AXILLARY LYMPH NODE DISSECTION (Left) - LMA w/PEC BLOCK RADIOACTIVE SEED GUIDED LEFT BREAST LUMPECTOMY x2 TARGETED LEFT AXILLARY LYMPH NODE DISSECTION BREAST REDUCTION WITH LIPOSUCTION (Bilateral) as a surgical intervention.  The patient's history has been reviewed, patient examined, no change in status, stable for surgery.  I have reviewed the patient's chart and labs.  Questions were answered to the patient's satisfaction.     Estefana RAMAN Manson [place]

## 2024-04-09 NOTE — Discharge Instructions (Addendum)
 No tylenol  until 4:25 p.m.   [facility] Office Phone Number [phone]  BREAST BIOPSY/ PARTIAL MASTECTOMY: POST OP INSTRUCTIONS  Always review your discharge instruction sheet given to you by the facility where your surgery was performed.  IF YOU HAVE DISABILITY OR FAMILY LEAVE FORMS, YOU MUST BRING THEM TO THE OFFICE FOR PROCESSING.  DO NOT GIVE THEM TO YOUR DOCTOR.  A prescription for pain medication may be given to you upon discharge.  Take your pain medication as prescribed, if needed.  If narcotic pain medicine is not needed, then you may take acetaminophen  (Tylenol ) or ibuprofen (Advil) as needed. Take your usually prescribed medications unless otherwise directed If you need a refill on your pain medication, please contact your pharmacy.  They will contact our office to request authorization.  Prescriptions will not be filled after 5pm or on week-ends. You should eat very light the first 24 hours after surgery, such as soup, crackers, pudding, etc.  Resume your normal diet the day after surgery. Most patients will experience some swelling and bruising in the breast.  Ice packs and a good support bra will help.  Swelling and bruising can take several days to resolve.  It is common to experience some constipation if taking pain medication after surgery.  Increasing fluid intake and taking a stool softener will usually help or prevent this problem from occurring.  A mild laxative (Milk of Magnesia or Miralax) should be taken according to package directions if there are no bowel movements after 48 hours. Unless discharge instructions indicate otherwise, you may remove your bandages 24-48 hours after surgery, and you may shower at that time.  You may have steri-strips (small skin tapes) in place directly over the incision.  These strips should be left on the skin for 7-10 days.  If your surgeon used skin glue on the incision, you may shower in 24 hours.  The glue will flake off  over the next 2-3 weeks.  Any sutures or staples will be removed at the office during your follow-up visit. ACTIVITIES:  You may resume regular daily activities (gradually increasing) beginning the next day.  Wearing a good support bra or sports bra minimizes pain and swelling.  You may have sexual intercourse when it is comfortable. You may drive when you no longer are taking prescription pain medication, you can comfortably wear a seatbelt, and you can safely maneuver your car and apply brakes. RETURN TO WORK:  ______________________________________________________________________________________ Rosine should see your doctor in the office for a follow-up appointment approximately two weeks after your surgery.  Your doctor's nurse will typically make your follow-up appointment when she calls you with your pathology report.  Expect your pathology report 2-3 business days after your surgery.  You may call to check if you do not hear from us  after three days. OTHER INSTRUCTIONS: _______________________________________________________________________________________________ _____________________________________________________________________________________________________________________________________ _____________________________________________________________________________________________________________________________________ _____________________________________________________________________________________________________________________________________  WHEN TO CALL YOUR DOCTOR: Fever over 101.0 Nausea and/or vomiting. Extreme swelling or bruising. Continued bleeding from incision. Increased pain, redness, or drainage from the incision.  The clinic staff is available to answer your questions during regular business hours.  Please don't hesitate to call and ask to speak to one of the nurses for clinical concerns.  If you have a medical emergency, go to the nearest emergency room or call  911.  A surgeon from [facility] is always on call at the hospital.  For further questions, please visit [email or web address]    Post Anesthesia Home Care Instructions  Activity: Get  plenty of rest for the remainder of the day. A responsible individual must stay with you for 24 hours following the procedure.  For the next 24 hours, DO NOT: -Drive a car -Advertising copywriter -Drink alcoholic beverages -Take any medication unless instructed by your physician -Make any legal decisions or sign important papers.  Meals: Start with liquid foods such as gelatin or soup. Progress to regular foods as tolerated. Avoid greasy, spicy, heavy foods. If nausea and/or vomiting occur, drink only clear liquids until the nausea and/or vomiting subsides. Call your physician if vomiting continues.  Special Instructions/Symptoms: Your throat may feel dry or sore from the anesthesia or the breathing tube placed in your throat during surgery. If this causes discomfort, gargle with warm salt water. The discomfort should disappear within 24 hours.  If you had a scopolamine patch placed behind your ear for the management of post- operative nausea and/or vomiting:  1. The medication in the patch is effective for 72 hours, after which it should be removed.  Wrap patch in a tissue and discard in the trash. Wash hands thoroughly with soap and water. 2. You may remove the patch earlier than 72 hours if you experience unpleasant side effects which may include dry mouth, dizziness or visual disturbances. 3. Avoid touching the patch. Wash your hands with soap and water after contact with the patch.   Information for Discharge Teaching: EXPAREL  (bupivacaine  liposome injectable suspension)   Pain relief is important to your recovery. The goal is to control your pain so you can move easier and return to your normal activities as soon as possible after your procedure. Your physician may use several types of  medicines to manage pain, swelling, and more.  Your surgeon or anesthesiologist gave you EXPAREL (bupivacaine ) to help control your pain after surgery.  EXPAREL  is a local anesthetic designed to release slowly over an extended period of time to provide pain relief by numbing the tissue around the surgical site. EXPAREL  is designed to release pain medication over time and can control pain for up to 72 hours. Depending on how you respond to EXPAREL , you may require less pain medication during your recovery. EXPAREL  can help reduce or eliminate the need for opioids during the first few days after surgery when pain relief is needed the most. EXPAREL  is not an opioid and is not addictive. It does not cause sleepiness or sedation.   Important! A teal colored band has been placed on your arm with the date, time and amount of EXPAREL  you have received. Please leave this armband in place for the full 96 hours following administration, and then you may remove the band. If you return to the hospital for any reason within 96 hours following the administration of EXPAREL , the armband provides important information that your health care providers to know, and alerts them that you have received this anesthetic.    Possible side effects of EXPAREL : Temporary loss of sensation or ability to move in the area where medication was injected. Nausea, vomiting, constipation Rarely, numbness and tingling in your mouth or lips, lightheadedness, or anxiety may occur. Call your doctor right away if you think you may be experiencing any of these sensations, or if you have other questions regarding possible side effects.  Follow all other discharge instructions given to you by your surgeon or nurse. Eat a healthy diet and drink plenty of water or other fluids.

## 2024-04-09 NOTE — Op Note (Signed)
 Kathryn Delgado 04/09/2024   Pre-op Diagnosis: LEFT BREAST CANCER     Post-op Diagnosis: LEFT BREAST CANCER  Procedure(s): RADIOACTIVE SEED GUIDED LEFT BREAST LUMPECTOMY TARGETED DEEP LEFT AXILLARY LYMPH NODE BIOPSY  Surgeon: [name], MD  Anesthesia: General  Staff:  Circulator: McDonough-Hughes, Jodi C, RN; Eliberto Geroge HERO, RN Physician Assistant: Lenis Donnice PARAS, PA-C Relief Circulator: Mannie Ellouise LABOR, RN Scrub Person: Milford Asberry CROME  Estimated Blood Loss: Minimal               Specimens: sent to path  Indications: This is a 58 year old female who was found on mammography to have a almost 2 cm mass in the nipple-areolar complex of the left breast.  Deeper to this mass with a second 8 mm mass.  She also had large lymph nodes in the left axilla.  Biopsy of the 2 masses in the breast showed invasive ductal carcinoma.  A biopsy lymph node was also positive for malignancy.  After discussion with the patient, she wishes to proceed with a radioactive seed guided left breast lumpectomy with 2 seeds removed 2 areas.  I explained this with include removal of the nipple areolar complex as it is suspected that the malignancy is growing into the nipple.  She will also undergo a targeted lymph node.  This will be followed by an oncoplastic reduction of the her breasts.  Findings: I performed a large central lumpectomy which included the entire nipple areolar complex as well as included both radioactive seeds and biopsy clips.  The specimen was sent to pathology for evaluation.  A target lymph node dissection was also performed.  The lymph node with the radioactive seed was necrotic.  The seed was placed separately in a cup and x-rayed.  Procedure: The patient is brought to the operating room identified as correct patient.  She was placed upon the operating table and general anesthesia was induced.  I injected 2 cc of mag trace underneath the left nipple areolar complex and the  breast was massaged.  Her bilateral breast and axilla were then prepped and draped in usual sterile fashion.  I performed an elliptical incision cranial to caudal on the left breast as marked by plastic surgery to include the nipple areolar complex.  I then dissected down to the breast tissue circumferentially with electrocautery.  With the aid in the neoprobe I dissected in the deep breast tissue underneath this area to include the smaller 8 mm cancer.  This was more in the medial breast.  I then completed the very large lumpectomy with electrocautery going down deep to the signal from the radioactive seed.  Again, the large lumpectomy specimen included the nipple areolar complex as well as both seeds and biopsy clips.  All margins were marked with the pain.  An x-ray was performed on the specimen confirming that the seeds and biopsy close were present.  This was then sent to pathology for evaluation.  With the neoprobe I then located an area in the left axilla containing the target lymph node.  I made a longitudinal incision with the axilla with a scalpel and then dissecting down into the deep axillary tissue with electrocautery.  There was a group of lymph nodes together contain the radioactive seed.  I grasped these with an Allis clamp and there was a necrotic lymph node likely continue to see disease came out and was placed in a cup and x-rayed separately.  I excised this lymph node as well as the surrounding  palpable lymph nodes that also had uptake of mag trace as indicated by the mag trace probe.  These lymph nodes as well as a several lymph node over the sent to pathology for evaluation.  No other increased uptake in the axilla was identified.  Hemostasis in the axilla appeared to be achieved.  I placed surgical clips around the periphery of where the 8 mm malignancy would be in the left breast.  At this point, Dr. Lowery then continued her portion of the procedure with the oncoplastic bilateral breast  reduction          [name]   Date: 04/09/2024  Time: 1:09 PM

## 2024-04-09 NOTE — Progress Notes (Signed)
 Assisted Dr. [name] with left, pectoralis, ultrasound guided block. Side rails up, monitors on throughout procedure. See vital signs in flow sheet. Tolerated Procedure well.

## 2024-04-09 NOTE — Anesthesia Procedure Notes (Signed)
 Anesthesia Regional Block: Pectoralis block   Pre-Anesthetic Checklist: , timeout performed,  Correct Patient, Correct Site, Correct Laterality,  Correct Procedure, Correct Position, site marked,  Risks and benefits discussed,  Surgical consent,  Pre-op evaluation,  At surgeon's request and post-op pain management  Laterality: Left  Prep: chloraprep       Needles:  Injection technique: Single-shot  Needle Type: Stimiplex     Needle Length: 9cm  Needle Gauge: 21     Additional Needles:   Procedures:,,,, ultrasound used (permanent image in chart),,    Narrative:  Start time: 04/09/2024 11:55 AM End time: 04/09/2024 11:59 AM  Performed by: Personally  Anesthesiologist: Boone Fess, MD  Additional Notes: Patient's chart reviewed and they were deemed appropriate candidate for procedure, per surgeon's request. Patient educated about risks, benefits, and alternatives of the block including but not limited to: temporary or permanent nerve damage, bleeding, infection, damage to surround tissues, pneumothorax, block failure, local anesthetic toxicity. Patient expressed understanding. A formal time-out was conducted consistent with institution rules.  Monitors were applied, and minimal sedation used (see nursing record). The site was prepped with skin prep and allowed to dry, and sterile gloves were used. A high frequency linear ultrasound probe with probe cover was utilized throughout. Ribs visualized on ultrasound in the anterior axillary line, and fourth rib located. Pectoralis major, pectoralis minor, and serratus muscles identified. Image appeared anatomically normal. Needle trajectory visualized throughout. Aspiration performed every 5ml and was negative. Half of injectate delivered between serratus and pectoralis minor (PECS II) followed by half of injectate between pectoralis major and pectoralis minor (PECSI). Blood vessels and pleura were avoided. All injections were performed without  resistance and free of blood and paresthesias. The patient tolerated the procedure well.  Injectate: 30ml 0.25% bupivacaine 

## 2024-04-09 NOTE — Interval H&P Note (Signed)
 History and Physical Interval Note: no change in H and P  04/09/2024 10:36 AM  Kathryn Delgado  has presented today for surgery, with the diagnosis of LEFT BREAST CANCER.  The various methods of treatment have been discussed with the patient and family. After consideration of risks, benefits and other options for treatment, the patient has consented to  Procedure(s) with comments: BREAST LUMPECTOMY WITH RADIOACTIVE SEED AND AXILLARY LYMPH NODE DISSECTION (Left) - LMA w/PEC BLOCK RADIOACTIVE SEED GUIDED LEFT BREAST LUMPECTOMY x2 TARGETED LEFT AXILLARY LYMPH NODE DISSECTION BREAST REDUCTION WITH LIPOSUCTION (Bilateral) as a surgical intervention.  The patient's history has been reviewed, patient examined, no change in status, stable for surgery.  I have reviewed the patient's chart and labs.  Questions were answered to the patient's satisfaction.     [name]

## 2024-04-09 NOTE — Anesthesia Procedure Notes (Signed)
 Procedure Name: Intubation Date/Time: 04/09/2024 12:24 PM  Performed by: Leopoldo Wanda DASEN, CRNAPre-anesthesia Checklist: Patient identified, Emergency Drugs available, Suction available and Patient being monitored Patient Re-evaluated:Patient Re-evaluated prior to induction Oxygen Delivery Method: Circle System Utilized Preoxygenation: Pre-oxygenation with 100% oxygen Induction Type: IV induction Ventilation: Mask ventilation without difficulty and Oral airway inserted - appropriate to patient size Laryngoscope Size: Mac and 3 Grade View: Grade I Tube type: Oral Tube size: 6.5 mm Number of attempts: 1 Airway Equipment and Method: Stylet and Oral airway Placement Confirmation: ETT inserted through vocal cords under direct vision, positive ETCO2 and breath sounds checked- equal and bilateral Secured at: 23 cm Tube secured with: Tape Dental Injury: Teeth and Oropharynx as per pre-operative assessment

## 2024-04-09 NOTE — Transfer of Care (Signed)
 Immediate Anesthesia Transfer of Care Note  Patient: Amauria Younts  Procedure(s) Performed: BREAST LUMPECTOMY WITH RADIOACTIVE SEED AND AXILLARY LYMPH NODE DISSECTION (Left: Breast) BREAST REDUCTION WITH LIPOSUCTION (Bilateral: Breast)  Patient Location: PACU  Anesthesia Type:General and Regional  Level of Consciousness: awake, patient cooperative, and responds to stimulation  Airway & Oxygen Therapy: Patient Spontanous Breathing  Post-op Assessment: Report given to RN, Post -op Vital signs reviewed and stable, and Patient moving all extremities X 4  Post vital signs: Reviewed and stable  Last Vitals:  Vitals Value Taken Time  BP 156/79 04/09/24 14:50  Temp    Pulse 99 04/09/24 14:52  Resp 17 04/09/24 14:52  SpO2 93 % 04/09/24 14:52  Vitals shown include unfiled device data.  Last Pain: There were no vitals filed for this visit.       Complications: No notable events documented.

## 2024-04-09 NOTE — Op Note (Signed)
 Oncoplastic Breast Reduction Op note:    DATE OF PROCEDURE: 04/09/2024  LOCATION: [facility]  SURGEON: Estefana Fritter, DO  PREOPERATIVE DIAGNOSIS 1. Left breast cancer and planned partial mastectomy 2. Macromastia, Neck Pain, Back Pain  POSTOPERATIVE DIAGNOSIS Same as preoperative diagnosis  PROCEDURES 1. Bilateral breast reduction.  Right reduction 527 gm, Left reduction 167 gm + mastectomy  COMPLICATIONS: None.  DRAINS: none  INDICATIONS FOR PROCEDURE Kathryn Delgado is a 58 y.o. year-old female born on 1966-01-02,with a history of symptomatic macromastia with concomitant back pain, neck pain, shoulder grooving from her bra.  The biggest issue was the left side breast cancer.  Patient planned for left breast partial mastectomy and needed symmetry surgery for the other side. MRN: [id number]  CONSENT Informed consent was obtained directly from the patient. The risks, benefits and alternatives were fully discussed. Specific risks including but not limited to bleeding, infection, hematoma, seroma, scarring, pain, nipple necrosis, asymmetry, poor cosmetic results, and need for further surgery were discussed. The patient's questions were answered.  DESCRIPTION OF PROCEDURE  Patient was brought into the operating room and rested on the operating room table in the supine position.  SCDs were placed and appropriate padding was performed.  Antibiotics were given. The patient underwent general anesthesia and the chest was prepped and draped in a sterile fashion.  A timeout was performed and all information was confirmed to be correct by those in the room.  Left side: Preoperative markings were confirmed.  I assisted gerneral surgery for the partial mastectomy.  When that portion of the case was done I began the reduction closure.  The axilla was closed with 3-0 and 4-0 Monocryl sutures.  The soft tissue edges were brought together with the 3-0 Vicryl.  This was a  vertical incision. The excess was noted at the inframammary fold.  The excess was marked for a horizontal incision and excise using the #15 blade.  The deep layers were closed with the PDS and then the 3-0 Monocryl.  Myriad was placed in the inframammary pocket prior to closure.  The weight of the partial mastectomy was likely 200-250 gm.   Right side: Tumescent was placed in the lateral breast.  Liposuction was done laterally.  Preoperative markings were confirmed.  Incision lines were injected with local containing epinephrine .  After waiting for vasoconstriction, the marked lines were incised with a #15 blade.  A lollipop type incision was made for a superomedial breast reduction.  This was performed by de-epithelializing the pedicle, using bovie to create the superomedial pedicle, and removing breast tissue from the superior, lateral, and inferior portions of the breast.  Care was taken to not undermine the breast pedicle. Myriad was placed in the pocket. Hemostasis was achieved.  The nipple was gently rotated into position and the soft tissue closed with 4-0 Monocryl.   The pocket was irrigated and hemostasis confirmed.  The deep tissues were approximated with 3-0 PDS sutures.  The skin was closed with deep dermal 3-0 Monocryl and subcuticular 4-0 Monocryl sutures.  The nipple and skin flaps had good capillary refill at the end of the procedure.  Experel was placed in the pocket on each side.  Dermabond was applied.  A breast binder and ABDs were placed.  The nipple and skin flaps had good capillary refill at the end of the procedure.  The patient tolerated the procedure well. The patient was allowed to wake from anesthesia and taken to the recovery room in satisfactory condition.

## 2024-04-10 ENCOUNTER — Encounter (HOSPITAL_BASED_OUTPATIENT_CLINIC_OR_DEPARTMENT_OTHER): Payer: Self-pay | Admitting: Surgery

## 2024-04-10 ENCOUNTER — Telehealth: Payer: Self-pay | Admitting: Plastic Surgery

## 2024-04-10 NOTE — Telephone Encounter (Signed)
 Patient had surgery on 11--3, she says that she was prescribed oxy but it's only making her sleep, she is still severe pair 9 out of 10, and would like to know if there is any other way to help with the pain, please advise

## 2024-04-10 NOTE — Anesthesia Postprocedure Evaluation (Signed)
 Anesthesia Post Note  Patient: Kathryn Delgado  Procedure(s) Performed: BREAST LUMPECTOMY WITH RADIOACTIVE SEED AND AXILLARY LYMPH NODE DISSECTION (Left: Breast) BREAST REDUCTION WITH LIPOSUCTION (Bilateral: Breast)     Patient location during evaluation: PACU Anesthesia Type: General Level of consciousness: awake and alert Pain management: pain level controlled Vital Signs Assessment: post-procedure vital signs reviewed and stable Respiratory status: spontaneous breathing, nonlabored ventilation, respiratory function stable and patient connected to nasal cannula oxygen Cardiovascular status: blood pressure returned to baseline and stable Postop Assessment: no apparent nausea or vomiting Anesthetic complications: no   No notable events documented.  Last Vitals:  Vitals:   04/09/24 1600 04/09/24 1620  BP: (!) 176/94   Pulse:    Resp: 16   Temp: (!) 36.2 C   SpO2: 97% 96%    Last Pain:  Vitals:                 [name]

## 2024-04-11 LAB — SURGICAL PATHOLOGY

## 2024-04-11 NOTE — Telephone Encounter (Signed)
 I attempted to call the patient but there was no answer. I left her a message to call the office back.

## 2024-04-16 ENCOUNTER — Ambulatory Visit (HOSPITAL_COMMUNITY)
Admission: RE | Admit: 2024-04-16 | Discharge: 2024-04-16 | Disposition: A | Discharge status: Home / Self Care | Source: Ambulatory Visit

## 2024-04-16 ENCOUNTER — Inpatient Hospital Stay: Attending: Genetic Counselor | Admitting: Licensed Clinical Social Worker

## 2024-04-16 ENCOUNTER — Encounter (HOSPITAL_COMMUNITY): Payer: Self-pay

## 2024-04-16 ENCOUNTER — Telehealth: Payer: Self-pay | Admitting: Licensed Clinical Social Worker

## 2024-04-16 ENCOUNTER — Telehealth: Payer: Self-pay | Admitting: *Deleted

## 2024-04-16 ENCOUNTER — Encounter: Payer: Self-pay | Admitting: *Deleted

## 2024-04-16 VITALS — BP 164/102 | HR 89 | Temp 98.9°F | Resp 16

## 2024-04-16 DIAGNOSIS — M79671 Pain in right foot: Secondary | ICD-10-CM

## 2024-04-16 DIAGNOSIS — M7989 Other specified soft tissue disorders: Secondary | ICD-10-CM | POA: Diagnosis not present

## 2024-04-16 MED ORDER — KETOROLAC TROMETHAMINE 30 MG/ML IJ SOLN
INTRAMUSCULAR | Status: AC
  Filled 2024-04-16: qty 1

## 2024-04-16 MED ORDER — KETOROLAC TROMETHAMINE 30 MG/ML IJ SOLN
30.0000 mg | Freq: Once | INTRAMUSCULAR | Status: AC
  Administered 2024-04-16: 30 mg via INTRAMUSCULAR

## 2024-04-16 NOTE — ED Provider Notes (Signed)
 [facility]    CSN: [id number] Arrival date & time: 04/16/24  1859      History   Chief Complaint Chief Complaint  Patient presents with   Foot Pain    Left foot - Entered by patient    HPI Kathryn Delgado is a 58 y.o. female.    Patient presents today with a 2-day history of increasing right foot pain. She denies any known injury or increased activity prior to symptom onset. She initially had minimal pain but then over the past 24 hours it has worsened and is currently rated 10 on a 0-10 pain scale, described as intense aching, no aggravating relieving factors identified.  She denies previous injury or surgery involving her foot.  She has tried Tylenol  without improvement.  She also has oxycodone  available because she just underwent breast surgery for breast cancer on 04/09/2024 and reports that this made her sleepy but did not help her pain.  She denies any history of gout.  She has had similar episodes in the past and was seen by our clinic in August 2025 at which point x-ray showed degenerative changes without acute abnormalities and she was started on prednisone  after being given Toradol  injection.  She believes that this is a similar presentation and does not think an x-ray is necessary.  She denies any numbness or paresthesias in her foot.  Denies any chest pain, shortness of breath.    Past Medical History:  Diagnosis Date   Alcohol use    Arthritis    self report   Gallstones 05/08/2018   Found on US    Gastroesophageal reflux disease 12/20/2018   Headache    Hyperlipidemia    Hypertension    Liver lesion 12/06/2019    Patient Active Problem List   Diagnosis Date Noted   Malignant neoplasm of central portion of left breast in female, estrogen receptor positive (HCC) 03/23/2024   Right foot pain 05/13/2023   Colonoscopy refused 05/13/2023   Mammogram declined 05/13/2023   Primary localized osteoarthritis of right knee 02/23/2022   S/P total knee  arthroplasty, right 02/23/2022   Primary osteoarthritis of left knee 09/01/2021   S/P total knee arthroplasty, left 09/01/2021   Elevated fasting blood sugar 08/20/2021   Mixed hyperlipidemia 08/18/2021   GERD without esophagitis 08/18/2021   Family history of diabetes mellitus (DM) 08/18/2021   Body mass index (BMI) of 39.0 to 39.9 in adult 08/18/2021   Acute pain of both shoulders 12/16/2020   Shoulder weakness 12/16/2020   Pain in both wrists 12/16/2020   Swelling of left wrist 12/16/2020   Pain in finger of both hands 12/16/2020   Chronic knee pain after total replacement of left knee joint 07/01/2020   10 year risk of MI or stroke < 7.5% 12/08/2019   Liver lesion 12/06/2019   Aortic atherosclerosis 12/06/2019   Alcohol consumption heavy 12/20/2018   Gallstones 05/08/2018   Essential hypertension 04/25/2018   Chronic pain of left knee 04/25/2018   Elevated ALT measurement 04/25/2018   Former smoker 04/25/2018    Past Surgical History:  Procedure Laterality Date   BREAST BIOPSY Left 03/08/2024   MM LT BREAST BX W LOC DEV 1ST LESION IMAGE BX SPEC STEREO GUIDE 03/08/2024 [facility]   BREAST BIOPSY Right 03/08/2024   MM RT BREAST BX W LOC DEV 1ST LESION IMAGE BX SPEC STEREO GUIDE 03/08/2024 [facility]   BREAST BIOPSY Left 03/09/2024   US  LT BREAST BX W LOC DEV 1ST LESION IMG  BX SPEC US  GUIDE 03/09/2024 [facility]   BREAST BIOPSY Left 04/06/2024   US  LT RADIOACTIVE SEED EA ADD LESION 04/06/2024 [facility]   BREAST BIOPSY Left 04/06/2024   US  LT RADIOACTIVE SEED LOC 04/06/2024 [facility]   BREAST BIOPSY  04/06/2024   MM LT RADIOACTIVE SEED LOC MAMMO GUIDE 04/06/2024 [facility]   BREAST LUMPECTOMY WITH RADIOACTIVE SEED AND AXILLARY LYMPH NODE DISSECTION Left 04/09/2024   Procedure: BREAST LUMPECTOMY WITH RADIOACTIVE SEED AND AXILLARY LYMPH NODE DISSECTION;  Surgeon: Vernetta Berg, MD;  Location: [facility];  Service:  General;  Laterality: Left;  LMA w/PEC BLOCK RADIOACTIVE SEED GUIDED LEFT BREAST LUMPECTOMY x2 TARGETED LEFT AXILLARY LYMPH NODE DISSECTION   BREAST REDUCTION SURGERY Bilateral 04/09/2024   Procedure: BREAST REDUCTION WITH LIPOSUCTION;  Surgeon: Lowery Estefana RAMAN, DO;  Location: [facility];  Service: Plastics;  Laterality: Bilateral;   CHOLECYSTECTOMY     FOOT SURGERY  2005   FOOT SURGERY Left 2009   KNEE ARTHROCENTESIS  08/2018   TOTAL KNEE ARTHROPLASTY Left 09/01/2021   Procedure: LEFT TOTAL KNEE ARTHROPLASTY;  Surgeon: Sheril Coy, MD;  Location: [facility];  Service: Orthopedics;  Laterality: Left;   TOTAL KNEE ARTHROPLASTY Right 02/23/2022   Procedure: RIGHT TOTAL KNEE ARTHROPLASTY;  Surgeon: Sheril Coy, MD;  Location: [facility];  Service: Orthopedics;  Laterality: Right;   WISDOM TOOTH EXTRACTION      OB History   No obstetric history on file.      Home Medications    Prior to Admission medications   Medication Sig Start Date End Date Taking? Authorizing Provider  fluconazole  (DIFLUCAN ) 150 MG tablet Take 150 mg by mouth once. 04/04/24  Yes [provider]  oxyCODONE  (ROXICODONE ) 5 MG immediate release tablet Take 1 tablet (5 mg total) by mouth every 6 (six) hours as needed for up to 15 doses for severe pain (pain score 7-10). Patient not taking: Reported on 04/16/2024 04/04/24   Andris Estefana BRAVO, PA-C  valsartan  (DIOVAN ) 40 MG tablet Take 1 tablet (40 mg total) by mouth daily. 02/16/24   Tysinger, Alm RAMAN, PA-C    Family History Family History  Problem Relation Age of Onset   Diabetes Mother    Breast cancer Paternal Aunt    Breast cancer Paternal Aunt    Diabetes Half-Brother    Thyroid disease Half-Sister    Diabetes Half-Sister    Thyroid disease Half-Sister    Colon polyps Neg Hx    Colon cancer Neg Hx    Esophageal cancer Neg Hx    Rectal cancer Neg Hx    Stomach cancer Neg Hx     Social History Social History   Tobacco Use    Smoking status: Former    Current packs/day: 0.00    Average packs/day: 0.5 packs/day for 40.0 years (20.0 ttl pk-yrs)    Types: Cigarettes    Start date: 12/05/1981    Quit date: 12/05/2021    Years since quitting: 2.3   Smokeless tobacco: Never  Vaping Use   Vaping status: Never Used  Substance Use Topics   Alcohol use: Yes    Alcohol/week: 2.0 standard drinks of alcohol    Types: 2 Cans of beer per week    Comment: socially   Drug use: No    Comment: former cocaine user      Allergies   Patient has no known allergies.   Review of Systems Review of Systems  Constitutional:  Positive for activity change.  Negative for appetite change, fatigue and fever.  Respiratory:  Negative for shortness of breath.   Cardiovascular:  Negative for chest pain, palpitations and leg swelling.  Gastrointestinal:  Negative for diarrhea, nausea and vomiting.  Musculoskeletal:  Positive for arthralgias. Negative for gait problem, joint swelling and myalgias.  Skin:  Negative for color change and wound.     Physical Exam Triage Vital Signs ED Triage Vitals  Encounter Vitals Group     BP 04/16/24 1911 (!) 164/102     Girls Systolic BP Percentile --      Girls Diastolic BP Percentile --      Boys Systolic BP Percentile --      Boys Diastolic BP Percentile --      Pulse Rate 04/16/24 1911 89     Resp 04/16/24 1911 16     Temp 04/16/24 1911 98.9 F (37.2 C)     Temp Source 04/16/24 1911 Oral     SpO2 04/16/24 1911 97 %     Weight --      Height --      Head Circumference --      Peak Flow --      Pain Score 04/16/24 1910 10     Pain Loc --      Pain Education --      Exclude from Growth Chart --    No data found.  Updated Vital Signs BP (!) 164/102 (BP Location: Right Arm)   Pulse 89   Temp 98.9 F (37.2 C) (Oral)   Resp 16   SpO2 97%   Visual Acuity Right Eye Distance:   Left Eye Distance:   Bilateral Distance:    Right Eye Near:   Left Eye Near:    Bilateral Near:      Physical Exam Vitals reviewed.  Constitutional:      General: She is awake. She is not in acute distress.    Appearance: Normal appearance. She is well-developed. She is not ill-appearing.     Comments: Very pleasant female appears stated age in no acute distress sitting comfortable in exam room  HENT:     Head: Normocephalic and atraumatic.  Cardiovascular:     Rate and Rhythm: Normal rate and regular rhythm.     Heart sounds: Normal heart sounds, S1 normal and S2 normal. No murmur heard.    Comments: 1+ pitting edema in right lower leg with bruising over dorsal foot.  Negative Homans' sign. Pulmonary:     Effort: Pulmonary effort is normal.     Breath sounds: Normal breath sounds. No wheezing, rhonchi or rales.     Comments: Clear to auscultation bilaterally Musculoskeletal:     Right lower leg: 1+ Edema present.     Left lower leg: No edema.     Right foot: Normal capillary refill. Swelling and tenderness present. No bony tenderness.       Legs:     Comments: Right ankle/foot: Tenderness palpation with associated swelling and apparent bruising noted proximal dorsal foot.  Foot is neurovascular intact.  No deformity noted.    Psychiatric:        Behavior: Behavior is cooperative.      UC Treatments / Results  Labs (all labs ordered are listed, but only abnormal results are displayed) Labs Reviewed - No data to display  EKG   Radiology No results found.  Procedures Procedures (including critical care time)  Medications Ordered in UC Medications  ketorolac  (TORADOL ) 30 MG/ML injection 30 mg (  30 mg Intramuscular Given 04/16/24 1951)    Initial Impression / Assessment and Plan / UC Course  I have reviewed the triage vital signs and the nursing notes.  Pertinent labs & imaging results that were available during my care of the patient were reviewed by me and considered in my medical decision making (see chart for details).     Patient is well-appearing,  afebrile, nontoxic, nontachycardic.  Her foot is neurovascularly intact.  I did offer an x-ray but she reports that she has not injured her foot and symptoms are similar to previous episodes of this condition which were ultimately diagnosed as arthritis but she declined the x-ray.  She is previously had success with Toradol  injection and so I did agree to give her this today.  No indication for dose adjustment based on metabolic panel from 05/13/2023.  We discussed that she is not to take NSAIDs for the next 24 hours since she was given a Toradol  injection in clinic today which she can use Tylenol  and the oxycodone  as prescribed by her surgeon.  We discussed that given the associated swelling of her leg and her several risk factors for VTE event I do think it is reasonable to get an ultrasound.  I did place an outpatient order and patient reports that if her symptoms have not resolved before tomorrow she will go have this done.  She denies any chest pain, shortness of breath, palpitations and so we discussed that if she develops any of the symptoms or has increasing pain she should go to the emergency room overnight.  Strict return precautions given.  All questions were answered to patient satisfaction.  If her symptoms do improved with medication but continued to recur I did recommend she follow-up with podiatry and was given the contact information.  Final Clinical Impressions(s) / UC Diagnoses   Final diagnoses:  Right foot pain  Right leg swelling     Discharge Instructions      I hope that your pain improves with the injection that we gave you.  Do not take NSAIDs including aspirin , ibuprofen/Advil, naproxen/Aleve for the next 24 hours.  You can continue the oxycodone  that was prescribed by your surgeon as well as Tylenol .  If you continue to have swelling tomorrow I would like you to go get the ultrasound as we discussed to make sure that there is no blood clot.  If anything worsens you have severe  pain overnight, chest pain, shortness of breath, heart racing you need to be seen immediately.     ED Prescriptions   None    PDMP not reviewed this encounter.   Sherrell Rocky POUR, PA-C 04/16/24 2031

## 2024-04-16 NOTE — Progress Notes (Unsigned)
 [facility] Clinical Social Work  Initial Assessment   Kathryn Delgado is a 58 y.o. year old female {[facility] accompanied / contacted by:25154}. Clinical Social Work was referred by new patient protocol for {[facility] csw reason for visit:33083}.   SDOH (Social Determinants of Health) assessments performed: {yes/no:20286}   SDOH Screenings   Food Insecurity: No Food Insecurity (03/27/2024)  Housing: Unknown (03/27/2024)  Transportation Needs: No Transportation Needs (03/27/2024)  Utilities: Not At Risk (03/27/2024)  Depression (PHQ2-9): Low Risk  (03/27/2024)  Tobacco Use: Medium Risk (04/09/2024)    PHQ 2/9:    03/27/2024    1:30 PM 02/16/2024    9:51 AM 05/13/2023   11:57 AM  Depression screen PHQ 2/9  Decreased Interest 0 0 0  Down, Depressed, Hopeless 0 0 0  PHQ - 2 Score 0 0 0     Distress Screen completed: {yes/no:20286}     No data to display            Family/Social Information:  Housing Arrangement: {[facility] housing:27476}*** Family members/support persons in your life? {[facility] support people:27477} Transportation concerns: {YES/NO/WILD RJMID:81418}  Employment: {Hx; Work history:[id number]} ***.  Income source: {Source of [phone]} Financial concerns: {Financial concern yes/no:24779} Type of concern: {Financial concern types:24780} Food access concerns: {YES/NO/WILD RJMID:81418} Religious or spiritual practice: {[facility] AMB YES/NO/NOT KNOWN 7:[id number]} Advanced directives: {CH[facility] AMB YES/NO/NOT KNOWN 7:[id number]} Services Currently in place:  ***  Coping/ Adjustment to diagnosis: Patient understands treatment plan and what happens next? {YES/NO/WILD RJMID:81418} Concerns about diagnosis and/or treatment: {[facility] worries:27479} Patient reported stressors: {[facility] Pt Stressors:24156} Hopes and/or priorities: *** Patient enjoys {Hobbies:24782} Current coping skills/ strengths: {PATIENT STRENGTHS:22666:a}    SUMMARY: Current SDOH Barriers:  {CCM SW  BARRIERS:22256}  Clinical Social Work Clinical Goal(s):  {[facility] SW goals:27480}  Interventions: Discussed common feeling and emotions when being diagnosed with cancer, and the importance of support during treatment Informed patient of the support team roles and support services at [facility] Provided CSW contact information and encouraged patient to call with any questions or concerns {[facility] prog note intervention:33027}   Follow Up Plan: {CSW Follow le:75111} Patient verbalizes understanding of plan: {yes/no:20286}    Kathryn Nam E Tikesha Mort, LCSW Clinical Social Worker [facility]  Patient is participating in a Managed Medicaid Plan:  {MM YES/NO:27447::Yes}

## 2024-04-16 NOTE — Discharge Instructions (Signed)
 I hope that your pain improves with the injection that we gave you.  Do not take NSAIDs including aspirin , ibuprofen/Advil, naproxen/Aleve for the next 24 hours.  You can continue the oxycodone  that was prescribed by your surgeon as well as Tylenol .  If you continue to have swelling tomorrow I would like you to go get the ultrasound as we discussed to make sure that there is no blood clot.  If anything worsens you have severe pain overnight, chest pain, shortness of breath, heart racing you need to be seen immediately.

## 2024-04-16 NOTE — Telephone Encounter (Signed)
 [facility] Clinical Social Work  Visual Merchandiser attempted to contact patient by phone to offer support and assess for needs in new cancer patient.   10:40am- pt answered but had been sleeping. Requested call back in the afternoon 2:40pm- 2nd attempt to call. No answer. Left VM with direct contact information and brief description of support services     Joey Lierman E Kerim Statzer, LCSW  Clinical Social Worker [facility]

## 2024-04-16 NOTE — ED Triage Notes (Signed)
 Patient here today with c/o right foot pain X 2 days. Patient has increased pain with weightbearing and has worsened this morning. Patient has a h/o arthritis in her right foot. No known injury.

## 2024-04-16 NOTE — Telephone Encounter (Signed)
 Ordered oncotype per Dr. Pamelia Hoit. Sent requisition to pathology and exact sciences.

## 2024-04-17 ENCOUNTER — Encounter: Payer: Self-pay | Admitting: Plastic Surgery

## 2024-04-17 ENCOUNTER — Ambulatory Visit (INDEPENDENT_AMBULATORY_CARE_PROVIDER_SITE_OTHER): Admitting: Plastic Surgery

## 2024-04-17 ENCOUNTER — Other Ambulatory Visit: Payer: Self-pay | Admitting: Medical

## 2024-04-17 ENCOUNTER — Ambulatory Visit: Payer: Self-pay

## 2024-04-17 ENCOUNTER — Telehealth (HOSPITAL_COMMUNITY): Payer: Self-pay

## 2024-04-17 VITALS — BP 178/115 | HR 84 | Ht 67.0 in | Wt 239.0 lb

## 2024-04-17 DIAGNOSIS — Z170 Estrogen receptor positive status [ER+]: Secondary | ICD-10-CM

## 2024-04-17 DIAGNOSIS — Z9889 Other specified postprocedural states: Secondary | ICD-10-CM

## 2024-04-17 DIAGNOSIS — C50112 Malignant neoplasm of central portion of left female breast: Secondary | ICD-10-CM

## 2024-04-17 MED ORDER — VALSARTAN-HYDROCHLOROTHIAZIDE 80-12.5 MG PO TABS: 1.0000 | ORAL_TABLET | Freq: Every day | ORAL | 1 refills | Status: DC

## 2024-04-17 NOTE — Progress Notes (Signed)
 The patient is a 58 year old female here for follow-up after an oncoplastic breast reduction.  She has a fair bit of swelling and bruising.  No sign of a hematoma.  No redness or cellulitis.  She is a little bit tender.  She said that the Exparel  did really well.  I removed to the outer dressing.  She should continue with the binder or a sports bra.  I would like to see her back in 2 weeks.  Will get pictures at her follow-up visit.

## 2024-04-17 NOTE — Telephone Encounter (Signed)
 FYI Only or Action Required?: Action required by provider: clinical question for provider.  Patient was last seen in primary care on 02/16/2024 by Bulah Alm RAMAN, PA-C.  Called Nurse Triage reporting Advice Only.  Symptoms began today.   Triage Disposition: Information or Advice Only Call  Patient/caregiver understands and will follow disposition?: No, wishes to speak with PCP   Copied from CRM #[id number]. Topic: Clinical - Red Word Triage >> Apr 17, 2024 10:15 AM Aleatha BROCKS wrote: Red Word that prompted transfer to Nurse Triage: Patient went to urgent care last night due to blood pressure being high then went to doctor today and her blood pressure was high again, she says the top number was 170 doesn't remember bottom number but it was alo high she is afraid that she may have a stroke, and wants to know what can help her medication dosage for her valsartan  (DIOVAN ) was just raised to 80mg  and its not helping Reason for Disposition  Health information question, no triage required and triager able to answer question    Pt refuses appt due to not money - will route encounter to PCP  Answer Assessment - Initial Assessment Questions 1. REASON FOR CALL: What is the main reason for your call? or How can I best help you?    Pt called stating she does not have any money to pay a co-pay for office visit however she wanted to make her PCP the last 2 times she was seen her BP readings was high:  yesterday in ER for pain in foot BP 164/102 and at doctor's office this morning for follow up on foot top number 170 and unsure of bottom number.  Pt wants to know if PCP would like to make any adjustments to pt medication dose.  Pt does not take BP readings at home even though has a BP monitor because it is always high and pt is not having any s/s of high BP other than BP readings.  Please call pt.  Protocols used: Information Only Call - No Triage-A-AH

## 2024-04-17 NOTE — Telephone Encounter (Signed)
 I called Kathryn Delgado this morning, spoke with her approx. 7:19 she told me that the provider she saw said to her that if she is feeling better she can forego the appointment for today.   Kathryn Delgado said she is feeling better and would like to cancel the request based on provider instructions.   I informed Kathryn Delgado that I would be making an encounter to document the exchange, and she understood.  I also wished her well with the rest of her morning.   If you need any assistance moving forward my direct dial is [phone].   Again, I would like to thank you for your time this morning and I hope you have a wonderful day!

## 2024-04-18 NOTE — Telephone Encounter (Signed)
 Left message for pt to call back to schedule a follow-up in a month

## 2024-04-20 ENCOUNTER — Inpatient Hospital Stay: Admitting: Licensed Clinical Social Worker

## 2024-04-20 NOTE — Progress Notes (Signed)
 [facility] CSW Counseling Note  Patient was referred by self. Treatment type: Individual  Presenting Concerns: Patient and/or family reports the following symptoms/concerns: adjustment to cancer/ grief Duration of problem: 1 months; Severity of problem: mild   Orientation:oriented to person, place, time/date, and situation.   Affect: Appropriate and Congruent Risk of harm to self or others: No plan to harm self or others  Patient and/or Family's Strengths/Protective Factors: Concrete supports in place (healthy food, safe environments, etc.)Capable of independent living  Motivation for treatment/growth  Other: supportive significant other     Goals Addressed: Patient will:  Increase healthy adjustment to current life circumstances   Progress towards Goals: Initial   Interventions: Interventions utilized:  Supportive Counseling and Supportive Reflection      03/27/2024    1:30 PM 02/16/2024    9:51 AM 05/13/2023   11:57 AM  PHQ9 SCORE ONLY  PHQ-9 Total Score 0 0 0      Data saved with a previous flowsheet row definition        No data to display             Assessment: Patient currently experiencing adjustment to cancer including physical changes (lumpectomy & reduction) and number of appointments, medical language. In addition to breast changes and grief/adjustment visually, patient shared about dynamics with her family (sisters and brothers). She is typically independent and very straightforward. Pt asked them to come during & after surgery, but they did not. Pt is thinking about whether or not she will say anything about how this felt.      Plan: Follow up with CSW: 05/07/24 Behavioral recommendations: continue to take care of yourself and attend appts. It is up to you on what boundaries you set in regard to communication with family Referral(s): n/a       Zanyiah Posten E Indica Marcott, LCSW

## 2024-04-24 ENCOUNTER — Telehealth: Payer: Self-pay | Admitting: *Deleted

## 2024-04-24 ENCOUNTER — Encounter: Payer: Self-pay | Admitting: *Deleted

## 2024-04-24 DIAGNOSIS — C50112 Malignant neoplasm of central portion of left female breast: Secondary | ICD-10-CM

## 2024-04-24 NOTE — Telephone Encounter (Signed)
 Received oncotype results of 18/4%.  Spoke with patient to make her aware of the results and that her next step would radiation.  Referral placed for Dr. Dewey.

## 2024-04-25 ENCOUNTER — Telehealth (INDEPENDENT_AMBULATORY_CARE_PROVIDER_SITE_OTHER): Admitting: Medical

## 2024-04-25 ENCOUNTER — Ambulatory Visit: Payer: Self-pay | Admitting: *Deleted

## 2024-04-25 ENCOUNTER — Encounter (HOSPITAL_COMMUNITY): Payer: Self-pay

## 2024-04-25 VITALS — Wt 232.0 lb

## 2024-04-25 DIAGNOSIS — E782 Mixed hyperlipidemia: Secondary | ICD-10-CM | POA: Diagnosis not present

## 2024-04-25 DIAGNOSIS — I10 Essential (primary) hypertension: Secondary | ICD-10-CM | POA: Diagnosis not present

## 2024-04-25 DIAGNOSIS — L299 Pruritus, unspecified: Secondary | ICD-10-CM

## 2024-04-25 NOTE — Progress Notes (Signed)
 Subjective:     Patient ID: Kathryn Delgado, female   DOB: February 05, 1966, 58 y.o.   MRN: [id number]  Documentation for virtual audio and video telecommunications through Caregility encounter:  The patient was located at home. The provider was located in the office. The patient did consent to this visit and is aware of possible charges through their insurance for this visit.  The other persons participating in this telemedicine service were none. Time spent on call was 20 minutes and in review of previous records 20 minutes total.  This virtual service is not related to other E/M service within previous 7 days.   HPI Chief Complaint  Patient presents with   Acute Visit    Allergic reaction- severe itching all over body, after taking diovan , started taking med on 11/14 and noticed it then but overlooked it and then it got worse on 11/15.    Virtual for concern for allergic reaction.  She has been having itching all over her body for the past few days.  She started increased dose of valsartan  HCT last week and then the itching came on fairly quickly.  No swelling or edema.  No other new soaps or detergents or treatment.  She is seeing oncology as she has a diagnosis of breast cancer  No chest pain, no palpitations.  She has used some Benadryl .  She has not taken her blood pressure pill today.  She complains that she already spent recently $20 for the current medicine and may have to turn around and spend more money on more blood pressure pill if we have to change it  She does report that in the last week her blood pressures have looked better though on the current dose  No other aggravating or relieving factors. No other complaint.  Past Medical History:  Diagnosis Date   Alcohol use    Arthritis    self report   Gallstones 05/08/2018   Found on US    Gastroesophageal reflux disease 12/20/2018   Headache    Hyperlipidemia    Hypertension    Liver lesion 12/06/2019   Current  Outpatient Medications on File Prior to Visit  Medication Sig Dispense Refill   valsartan -hydrochlorothiazide  (DIOVAN -HCT) 80-12.5 MG tablet TAKE 1 TABLET BY MOUTH DAILY (Patient not taking: Reported on 04/25/2024) 90 tablet 1   No current facility-administered medications on file prior to visit.    Review of Systems As in subjective    Objective:   Physical Exam Due to coronavirus pandemic stay at home measures, patient visit was virtual and they were not examined in person.   General: Well-developed well-nourished no acute distress No obvious angioedema or rash She seems little irritable or frustrated today     Assessment:     Encounter Diagnoses  Name Primary?   Itching Yes   Essential hypertension    Mixed hyperlipidemia         Plan:     Looking back through her records she has prior used enalapril , lisinopril , lisinopril  HCT, enalapril  HCT, valsartan , hydrochlorothiazide  by itself, and now valsartan  HCT.  She notes that she has never been able to get to goal with prior blood pressure medication.  There has been issues in the past with compliance.  Looking back in her prior records, I have seen her since July 2022.  Her blood pressures have been controlled at various times in the past when she has been in for visits despite her recollection of not being on her prior control of her  blood pressure  At this point we discussed that her current symptoms are not all that common with valsartan  HCT but cannot rule out that the blood pressure medicine could be causing a problem.  She also seems to be concerned about having to spend more money on medication and frustrated in general  I advised that I cannot predict allergic reaction given that she has tolerated a similar medication combination several times in the past  At this point she does not seem to be motivated to try different medication.  I offered a change to amlodipine or other such as Toprol or a different class of  medicine  Instead we will try 1/2 tablet of her current valsartan  HCT 80/12.5 mg daily assuming no additional itching or rash or edema.  If she tolerates this over the next few days she can try 1/2 tablet twice daily  If any new or worsening itching or any new symptoms such as swelling or angioedema she will stop this right away and let me know or get reevaluated  If this does not work out we will switch to either amlodipine or beta-blocker  We also discussed other strategies to improve blood pressure including losing weight, limiting salt, limiting animal products, cutting back on alcohol, regular exercise.  She has also been heavy with alcohol use in the past.  Advise she limit alcohol  Follow-up with oncology regarding breast cancer treatment  Kathryn Delgado was seen today for acute visit.  Diagnoses and all orders for this visit:  Essential hypertension  Mixed hyperlipidemia    Call report in 1 week

## 2024-04-25 NOTE — Telephone Encounter (Signed)
 FYI Only or Action Required?: FYI only for provider: appointment scheduled on 04/25/24 and please advise if in person appt needed .  Patient was last seen in primary care on 02/16/2024 by Bulah Alm RAMAN, PA-C.  Called Nurse Triage reporting Medication Problem. Severe itching after starting Diovan  on 04/17/24.  Symptoms began several days ago.  Interventions attempted: OTC medications: bendaryl.  Symptoms are: gradually worsening.  Triage Disposition: Call PCP Now  Patient/caregiver understands and will follow disposition?: Yes              Copied from CRM #[id number]. Topic: Clinical - Red Word Triage >> Apr 25, 2024 10:54 AM Carlyon D wrote: Red Word that prompted transfer to Nurse Triage: Pt has been put on valsartan -hydrochlorothiazide  (DIOVAN -HCT) 80-12.5 MG tablet. Pt states she is having an allergic reaction to this medication and her whole body is itching and pt states benadryl  is not working at all. Reason for Disposition  [1] Caller has URGENT medicine question about med that primary care doctor (or NP/PA) or specialist prescribed AND [2] triager unable to answer question  Answer Assessment - Initial Assessment Questions VV scheduled today with PCP. Patient reports BP is better than ever and can not stop itching and difficulty sleeping      1. NAME of MEDICINE: What medicine(s) are you calling about?     Diovan   2. QUESTION: What is your question? (e.g., double dose of medicine, side effect)     Can medication diovan  be causing severe itching and what other medication can patient take for management of BP? 3. PRESCRIBER: Who prescribed the medicine? Reason: if prescribed by specialist, call should be referred to that group.     PCP 4. SYMPTOMS: Do you have any symptoms? If Yes, ask: What symptoms are you having?  How bad are the symptoms (e.g., mild, moderate, severe)     Severe itching noted after starting diovan . Taking benadryl  with no relief. No  rash no hives, no chest pain no difficulty breathing no fever reported.  5. PREGNANCY:  Is there any chance that you are pregnant? When was your last menstrual period?     na  Protocols used: Medication Question Call-A-AH

## 2024-04-30 ENCOUNTER — Inpatient Hospital Stay (HOSPITAL_BASED_OUTPATIENT_CLINIC_OR_DEPARTMENT_OTHER): Admitting: Hematology and Oncology

## 2024-04-30 ENCOUNTER — Encounter: Payer: Self-pay | Admitting: *Deleted

## 2024-04-30 VITALS — BP 122/83 | HR 96 | Temp 97.6°F | Resp 17 | Ht 67.0 in | Wt 231.6 lb

## 2024-04-30 DIAGNOSIS — Z170 Estrogen receptor positive status [ER+]: Secondary | ICD-10-CM

## 2024-04-30 DIAGNOSIS — C50112 Malignant neoplasm of central portion of left female breast: Secondary | ICD-10-CM | POA: Diagnosis not present

## 2024-04-30 NOTE — Assessment & Plan Note (Signed)
 03/09/2024:Evaluation of left nipple pain: Mammogram and ultrasound: 1.9 cm spiculated mass, 8 mm retroareolar mass, right breast calcifications 6 mm left breast biopsy retroareolar: Grade 2 invasive moderately differentiated adenocarcinoma, left axilla lymph node positive, ER 100%, PR 1%, Ki67 5%, HER2 0, left breast biopsy retroareolar: Grade 2 IDC ER 95%, PR 5%, Ki67 10%, HER2 0, right breast biopsy: Sclerosing adenosis   04/09/2024: Left lumpectomy: Grade 2 IDC (2.2 cm) with DCIS with microinvasion, margins negative, 1/4 lymph node positive with extracapsular extension measuring 1.5 cm with mucinous features, left and right mammoplasty: Benign (no additional sclerosing adenosis) ER 95% and 100%, PR 100% and 5%, HER2 0 negative, Ki-67 5 to 10%  Oncotype DX recurrence score: 18 (distant recurrence at 5 years: 4%)  Treatment plan: Adjuvant radiation therapy followed by Adjuvant antiestrogen therapy  Return to clinic after radiation is completed She works third shift. She works from 7 PM to 6:30 AM. She works for [employer]. If she were to do radiation she would probably want to do it at 7:30 in the morning.

## 2024-04-30 NOTE — Progress Notes (Signed)
 Patient Care Team: Tysinger, Alm RAMAN, PA-C as PCP - General (Family Medicine) Gerome, Devere HERO, RN as Oncology Nurse Navigator Tyree Nanetta SAILOR, RN as Oncology Nurse Navigator Vernetta Berg, MD as Consulting Physician (General Surgery) Odean Potts, MD as Consulting Physician (Hematology and Oncology) Dewey Rush, MD as Consulting Physician (Radiation Oncology)  DIAGNOSIS:  Encounter Diagnosis  Name Primary?   Malignant neoplasm of central portion of left breast in female, estrogen receptor positive (HCC) Yes    SUMMARY OF ONCOLOGIC HISTORY: Oncology History  Malignant neoplasm of central portion of left breast in female, estrogen receptor positive (HCC)  03/09/2024 Initial Diagnosis   Evaluation of left nipple pain: Mammogram and ultrasound: 1.9 cm spiculated mass, 8 mm retroareolar mass, right breast calcifications 6 mm left breast biopsy retroareolar: Grade 2 invasive moderately differentiated adenocarcinoma, left axilla lymph node positive, ER 100%, PR 1%, Ki67 5%, HER2 0, left breast biopsy retroareolar: Grade 2 IDC ER 95%, PR 5%, Ki67 10%, HER2 0, right breast biopsy: Sclerosing adenosis   03/27/2024 Cancer Staging   Staging form: Breast, AJCC 8th Edition - Clinical stage from 03/27/2024: Stage IB (cT1c, cN1, cM0, G2, ER+, PR+, HER2-) - Signed by Odean Potts, MD on 03/27/2024 Stage prefix: Initial diagnosis Histologic grading system: 3 grade system     CHIEF COMPLIANT: Follow-up after surgery to discuss pathology report and treatment plan  HISTORY OF PRESENT ILLNESS:  History of Present Illness Kathryn Delgado is a 58 year old female with breast cancer who presents for follow-up after surgery.  She experiences recurrent seroma formation under her arm, with swelling returning by evening after drainage. She uses an ice pack and a compression garment, though the garment is painful. Another drainage procedure is scheduled for tomorrow.  Surgery for breast cancer  involved removal of a 2.2 cm tumor with negative margins. One of four lymph nodes removed contained cancer cells. Her cancer is estrogen and progesterone receptor positive, and HER2 negative.  She is apprehensive about taking medication, recalling it as a 'seven year deal'.     ALLERGIES:  has no known allergies.  MEDICATIONS:  Current Outpatient Medications  Medication Sig Dispense Refill   valsartan -hydrochlorothiazide  (DIOVAN -HCT) 80-12.5 MG tablet TAKE 1 TABLET BY MOUTH DAILY 90 tablet 1   No current facility-administered medications for this visit.    PHYSICAL EXAMINATION: ECOG PERFORMANCE STATUS: 1 - Symptomatic but completely ambulatory  Vitals:   04/30/24 0927  BP: 122/83  Pulse: 96  Resp: 17  Temp: 97.6 F (36.4 C)  SpO2: 100%   Filed Weights   04/30/24 0927  Weight: 231 lb 9.6 oz (105.1 kg)     LABORATORY DATA:  I have reviewed the data as listed    Latest Ref Rng & Units 05/13/2023   12:29 PM 02/10/2022    1:30 PM 09/01/2021    8:22 PM  CMP  Glucose 70 - 99 mg/dL 891  882  880   BUN 6 - 24 mg/dL 8  12  11    Creatinine 0.57 - 1.00 mg/dL 9.43  9.47  9.39   Sodium 134 - 144 mmol/L 141  139  134   Potassium 3.5 - 5.2 mmol/L 4.4  3.5  4.2   Chloride 96 - 106 mmol/L 109  114  101   CO2 20 - 29 mmol/L 20  19  22    Calcium  8.7 - 10.2 mg/dL 9.1  9.0  8.8   Total Protein 6.0 - 8.5 g/dL 7.0     Total Bilirubin  0.0 - 1.2 mg/dL 0.5     Alkaline Phos 44 - 121 IU/L 89     AST 0 - 40 IU/L 22     ALT 0 - 32 IU/L 28       Lab Results  Component Value Date   WBC 4.9 05/13/2023   HGB 13.6 05/13/2023   HCT 43.0 05/13/2023   MCV 89 05/13/2023   PLT 207 05/13/2023   NEUTROABS 6.1 08/18/2021    ASSESSMENT & PLAN:  Malignant neoplasm of central portion of left breast in female, estrogen receptor positive (HCC) 03/09/2024:Evaluation of left nipple pain: Mammogram and ultrasound: 1.9 cm spiculated mass, 8 mm retroareolar mass, right breast calcifications 6 mm left  breast biopsy retroareolar: Grade 2 invasive moderately differentiated adenocarcinoma, left axilla lymph node positive, ER 100%, PR 1%, Ki67 5%, HER2 0, left breast biopsy retroareolar: Grade 2 IDC ER 95%, PR 5%, Ki67 10%, HER2 0, right breast biopsy: Sclerosing adenosis   04/09/2024: Left lumpectomy: Grade 2 IDC (2.2 cm) with DCIS with microinvasion, margins negative, 1/4 lymph node positive with extracapsular extension measuring 1.5 cm with mucinous features, left and right mammoplasty: Benign (no additional sclerosing adenosis) ER 95% and 100%, PR 100% and 5%, HER2 0 negative, Ki-67 5 to 10%  Oncotype DX recurrence score: 18 (distant recurrence at 5 years: 4%)  Treatment plan: Adjuvant radiation therapy followed by Adjuvant antiestrogen therapy  Return to clinic after radiation is completed She works third shift. She works from 7 PM to 6:30 AM. She works for [employer]. If she were to do radiation she would probably want to do it at 7:30 in the morning.  ------------------------------------- Assessment and Plan Assessment & Plan Postoperative seroma of left axilla following breast cancer surgery Recurrent postoperative seroma in left axilla. Current management with ice packs insufficient. Compression garment causes discomfort. - Discuss with surgeon possibility of leaving a drain to prevent recurrence. - Advised soft cloth under compression garment to reduce discomfort.      No orders of the defined types were placed in this encounter.  The patient has a good understanding of the overall plan. she agrees with it. she will call with any problems that may develop before the next visit here.  I personally spent a total of 30 minutes in the care of the patient today including preparing to see the patient, getting/reviewing separately obtained history, performing a medically appropriate exam/evaluation, counseling and educating, placing orders, referring and communicating with other health care  professionals, documenting clinical information in the EHR, independently interpreting results, communicating results, and coordinating care.   Viinay K Ison Wichmann, MD 04/30/24

## 2024-05-01 ENCOUNTER — Ambulatory Visit: Admitting: Plastic Surgery

## 2024-05-01 ENCOUNTER — Encounter: Payer: Self-pay | Admitting: Plastic Surgery

## 2024-05-01 VITALS — BP 132/84 | HR 101

## 2024-05-01 DIAGNOSIS — C50112 Malignant neoplasm of central portion of left female breast: Secondary | ICD-10-CM

## 2024-05-01 DIAGNOSIS — Z170 Estrogen receptor positive status [ER+]: Secondary | ICD-10-CM

## 2024-05-01 DIAGNOSIS — Z9889 Other specified postprocedural states: Secondary | ICD-10-CM

## 2024-05-01 NOTE — Progress Notes (Signed)
 Location of Breast Cancer: left breast  Histology per Pathology Report:      Receptor Status: ER(pos), PR (pos), Her2-neu (neg), Ki-(5-10%)  Did patient present with symptoms (if so, please note symptoms) or was this found on screening mammography?:  Patient presented with left nipple pain on 03/09/24  Past/Anticipated interventions by surgeon, if any:  04/09/2024: Left lumpectomy: Grade 2 IDC (2.2 cm) with DCIS with microinvasion, margins negative, 1/4 lymph node positive with extracapsular extension measuring 1.5 cm with mucinous features, left and right mammoplasty: Benign (no additional sclerosing adenosis) ER 95% and 100%, PR 100% and 5%, HER2 0 negative, Ki-67 5 to 10%   Past/Anticipated interventions by medical oncology, if any: None  Lymphedema issues, if any:    Pain issues, if any:    Skin issues if any   SAFETY ISSUES: Prior radiation? no Pacemaker/ICD? no Possible current pregnancy? no Is the patient on methotrexate? no  Current Complaints / other details:      Dyke JULIANNA Frost, LPN [id number]/[id number],8:62 PM

## 2024-05-01 NOTE — Progress Notes (Signed)
 The patient is a 58 year old female here for follow-up after undergoing oncoplastic breast reduction.  She is healing really nicely.  She has a really good shape.  She says she has gotten some stinging and it is probably where the nerves are coming back.  She can start taking 500 mg of vitamin C 2-3 times a day.  If it continues we can start her on Neurontin.  Will get pictures at her next visit.  Steri-Strips reapplied to the bottom part of the vertical incision.  Steri-Strips can be trimmed as they start to unravel.

## 2024-05-07 ENCOUNTER — Inpatient Hospital Stay: Admitting: Licensed Clinical Social Worker

## 2024-05-10 ENCOUNTER — Ambulatory Visit
Admission: RE | Admit: 2024-05-10 | Discharge: 2024-05-10 | Disposition: A | Source: Ambulatory Visit | Attending: Radiation Oncology | Admitting: Radiation Oncology

## 2024-05-10 DIAGNOSIS — C50112 Malignant neoplasm of central portion of left female breast: Secondary | ICD-10-CM

## 2024-05-10 NOTE — Progress Notes (Addendum)
 Radiation Oncology         [phone] ________________________________    Outpatient Follow Up - Conducted via telephone at patient request.   I spoke with the patient to conduct this visit via telephone. The patient was notified in advance and was offered an in person or telemedicine meeting to allow for face to face communication but instead preferred to proceed with a telephone visit.  Name: Kathryn Delgado        MRN: [id number]  Date of Service: 05/10/2024 DOB: July 14, 1965  CC:Tysinger, Alm RAMAN, PA-C  Odean Potts, MD     REFERRING PHYSICIAN: Odean Potts, MD   DIAGNOSIS: The encounter diagnosis was Malignant neoplasm of central portion of left breast in female, estrogen receptor positive (HCC).   HISTORY OF PRESENT ILLNESS: Kathryn Delgado is a 58 y.o. female seen at the request of Dr. Vernetta for new diagnosis of left breast cancer.  The patient presented for diagnostic mammogram due to left nipple pain.  Imaging showed grouped pleomorphic calcifications approximately 6 mm in length in the upper outer right breast and no suspicious mass or architectural distortion.  In the left breast, subtle microcalcifications were noted within a spiculated high density mass underlying the marker about the retroareolar breast measuring up to 1.8 cm with subtle microcalcifications measuring 8 mm around the mass.  Microcalcifications were also noted within an enlarged high density rounded appearing mass or lymph node around the axilla.  By ultrasound on 03/01/2024, a mass in the 3 o'clock position measuring 1.9 cm was present, no ultrasound correlate was noted for the smaller mass seen in the retroareolar breast, and the axilla shows a 1.8 cm mass/lymph node.  It was recommended that she undergo stereotactic biopsy for bilateral breast calcifications, and ultrasound-guided biopsy to the left breast and axilla. Stereotactic biopsies on 03/08/24 showed sclerosing adenosis with calcifications of the right breast.  The left calcifications however showed grade 2 invasive ductal carcinoma. The cancer was ER/PR positive, HER 2 negative with a Ki 67 of 10%. Ultrasound biopsies of the left breast and node on 03/09/24 showed grade 2 invasive ductal carcinoma of the breast and the node was consistent with metastatic disease, and the breast tumor was ER/PR positive, HER2 negative with a Ki 67 of 5%.   Since her last visit, she underwent a left breast lumpectomy with targeted node biopsy on 04/09/24 as well as bilateral breast reduction. Final pathology showed 2 areas of grade 2 invasive ductal carcinoma the largest measuring 2.2 cm intermediate grade DCIS.  Her margins were negative, the closest being 4 mm to the medial margin for both invasive and in situ disease.  She had 4 sampled lymph nodes, 1 of which was positive for metastatic disease with extracapsular extension measuring 15 mm in greatest dimension.  There were mucinous features present in the, additional tissue from the bilateral mammoplasty specimens were consistent with benign breast change without evidence of malignancy.  She had an Oncotype DX score that was performed in a recurrence score result of 18 and confirmed her tumor to be ER/PR positive, HER2 negative.  She has been seen by Dr. Gudena and he did not recommend systemic chemotherapy based on her Oncotype score.  Has also been having clinical management of a left axillary seroma requiring aspiration on several occasions.  The most recent of this was on 05/08/2024. She's seen today to review the rationale for adjuvant radiotherapy.    PREVIOUS RADIATION THERAPY: No   PAST MEDICAL HISTORY:  Past Medical  History:  Diagnosis Date   Alcohol use    Arthritis    self report   Gallstones 05/08/2018   Found on US    Gastroesophageal reflux disease 12/20/2018   Headache    Hyperlipidemia    Hypertension    Liver lesion 12/06/2019       PAST SURGICAL HISTORY: Past Surgical History:  Procedure  Laterality Date   BREAST BIOPSY Left 03/08/2024   MM LT BREAST BX W LOC DEV 1ST LESION IMAGE BX SPEC STEREO GUIDE 03/08/2024 [facility]   BREAST BIOPSY Right 03/08/2024   MM RT BREAST BX W LOC DEV 1ST LESION IMAGE BX SPEC STEREO GUIDE 03/08/2024 [facility]   BREAST BIOPSY Left 03/09/2024   US  LT BREAST BX W LOC DEV 1ST LESION IMG BX SPEC US  GUIDE 03/09/2024 [facility]   BREAST BIOPSY Left 04/06/2024   US  LT RADIOACTIVE SEED EA ADD LESION 04/06/2024 [facility]   BREAST BIOPSY Left 04/06/2024   US  LT RADIOACTIVE SEED LOC 04/06/2024 [facility]   BREAST BIOPSY  04/06/2024   MM LT RADIOACTIVE SEED LOC MAMMO GUIDE 04/06/2024 [facility]   BREAST LUMPECTOMY WITH RADIOACTIVE SEED AND AXILLARY LYMPH NODE DISSECTION Left 04/09/2024   Procedure: BREAST LUMPECTOMY WITH RADIOACTIVE SEED AND AXILLARY LYMPH NODE DISSECTION;  Surgeon: Vernetta Berg, MD;  Location: [facility];  Service: General;  Laterality: Left;  LMA w/PEC BLOCK RADIOACTIVE SEED GUIDED LEFT BREAST LUMPECTOMY x2 TARGETED LEFT AXILLARY LYMPH NODE DISSECTION   BREAST REDUCTION SURGERY Bilateral 04/09/2024   Procedure: BREAST REDUCTION WITH LIPOSUCTION;  Surgeon: Lowery Estefana RAMAN, DO;  Location: [facility];  Service: Plastics;  Laterality: Bilateral;   CHOLECYSTECTOMY     FOOT SURGERY  2005   FOOT SURGERY Left 2009   KNEE ARTHROCENTESIS  08/2018   TOTAL KNEE ARTHROPLASTY Left 09/01/2021   Procedure: LEFT TOTAL KNEE ARTHROPLASTY;  Surgeon: Sheril Coy, MD;  Location: [facility];  Service: Orthopedics;  Laterality: Left;   TOTAL KNEE ARTHROPLASTY Right 02/23/2022   Procedure: RIGHT TOTAL KNEE ARTHROPLASTY;  Surgeon: Sheril Coy, MD;  Location: [facility];  Service: Orthopedics;  Laterality: Right;   WISDOM TOOTH EXTRACTION       FAMILY HISTORY:  Family History  Problem Relation Age of Onset   Diabetes Mother    Breast cancer Paternal Aunt    Breast cancer  Paternal Aunt    Diabetes Half-Brother    Thyroid disease Half-Sister    Diabetes Half-Sister    Thyroid disease Half-Sister    Colon polyps Neg Hx    Colon cancer Neg Hx    Esophageal cancer Neg Hx    Rectal cancer Neg Hx    Stomach cancer Neg Hx      SOCIAL HISTORY:  reports that she quit smoking about 2 years ago. Her smoking use included cigarettes. She started smoking about 42 years ago. She has a 20 pack-year smoking history. She has never used smokeless tobacco. She reports current alcohol use of about 2.0 standard drinks of alcohol per week. She reports that she does not use drugs. The patient is in a relationship with her partner Celene and lives in [place]. 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 She moved her work shift and will be working from 7pm to 3 am.    ALLERGIES: Patient has no known allergies.   MEDICATIONS:  Current  Outpatient Medications  Medication Sig Dispense Refill   valsartan -hydrochlorothiazide  (DIOVAN -HCT) 80-12.5 MG tablet TAKE 1 TABLET BY MOUTH DAILY 90 tablet 1   No current facility-administered medications for this encounter.     REVIEW OF SYSTEMS: On review of systems, the patient reports that she is doing well but is still sore from her seroma. She denies any drainage or erythema and feels that the area is improving slowly. She's wearing her compression bra but is not interested in evaluation with PT at this time. No other complaints are verbalized.      PHYSICAL EXAM:  Unable to assess due to encounter type.   ECOG = 0  0 - Asymptomatic (Fully active, able to carry on all predisease activities without restriction)  1 - Symptomatic but completely ambulatory (Restricted in physically strenuous activity but ambulatory and able to carry out work of a light or sedentary nature. For example, light housework, office work)  2 -  Symptomatic, <50% in bed during the day (Ambulatory and capable of all self care but unable to carry out any work activities. Up and about more than 50% of waking hours)  3 - Symptomatic, >50% in bed, but not bedbound (Capable of only limited self-care, confined to bed or chair 50% or more of waking hours)  4 - Bedbound (Completely disabled. Cannot carry on any self-care. Totally confined to bed or chair)  5 - Death   Raylene MM, Creech RH, Tormey DC, et al. [phone]). Toxicity and response criteria of the [facility]. Am. DOROTHA Bridges. Oncol. 5 (6): 649-55    LABORATORY DATA:  Lab Results  Component Value Date   WBC 4.9 05/13/2023   HGB 13.6 05/13/2023   HCT 43.0 05/13/2023   MCV 89 05/13/2023   PLT 207 05/13/2023   Lab Results  Component Value Date   NA 141 05/13/2023   K 4.4 05/13/2023   CL 109 (H) 05/13/2023   CO2 20 05/13/2023   Lab Results  Component Value Date   ALT 28 05/13/2023   AST 22 05/13/2023   ALKPHOS 89 05/13/2023   BILITOT 0.5 05/13/2023      RADIOGRAPHY: No results found.      IMPRESSION/PLAN: 1. Stage IB, pT2N1M0, grade 2 ER/PR positive invasive ductal carcinoma of th left breast. Dr. Dewey has reviewed her pathology results. She is doing well but still healing. She is happy to hear that Dr. Odean did not recommend chemotherapy, but is aware that Dr. Dewey would recommend external radiotherapy to the breast and regional nodes to reduce risks of local recurrence. Dr. Odean anticipates adjuvant antiestrogen therapy to follow. We discussed the risks, benefits, short, and long term effects of radiotherapy, as well as the curative intent, and the patient is interested in proceeding. We reviewed the delivery and logistics of radiotherapy and anticipates a course of 6 1/2 weeks of radiotherapy to the left breast and regional nodes. We will see her back a few weeks for simulation on 05/21/24 due to ongoing management of her postoperative seroma.   2. Smoking history. The patient was offered evaluation in lung cancer screening clinic but is not interested at this time.   This encounter was conducted via telephone.  The patient has provided two factor identification and has given verbal consent for this type of encounter and has been advised to only accept a meeting of this type in a secure network environment. The time spent during this encounter was 45 minutes including preparation, discussion, and coordination of  the patient's care. The attendants for this meeting include Sharene Cary, RN,  Donald Estefana Husband  and [place] During the encounter,  Sharene Cary, RN,  and Donald Estefana Husband were located at [facility] in the Radiation Oncology Department. Jeniyah Menor was located at home.         Donald KYM Husband, [facility]       **Disclaimer: This note was dictated with voice recognition software. Similar sounding words can inadvertently be transcribed and this note may contain transcription errors which may not have been corrected upon publication of note.**

## 2024-05-11 ENCOUNTER — Ambulatory Visit (HOSPITAL_COMMUNITY): Payer: Self-pay

## 2024-05-11 ENCOUNTER — Ambulatory Visit: Admitting: Radiation Oncology

## 2024-05-11 DIAGNOSIS — Z719 Counseling, unspecified: Secondary | ICD-10-CM

## 2024-05-14 NOTE — Progress Notes (Signed)
**Note Kathryn-Identified via Obfuscation**    PROVIDER:  PUJA GOSAI MACZIS, PA  MRN: [id number] DOB: 06-Jun-1966 DATE OF ENCOUNTER: 05/14/2024 Interval History:   Kathryn Delgado is a 58 y.o. female who underwent left breast lumpectomy and left axillary lymph node biopsy for left breast cancer by Dr. Vernetta followed by bilateral breast reduction by Dr. Lowery on 04/09/2024.  She has had the following aspirations: 150 cc on 04/20/2024, 100 cc on 04/24/2024, 135 cc on 05/01/2024, 50 cc on 05/08/2024.  She is presenting to the office today stating that she does not have much discomfort.  She believes the swelling is minimal. Denies fever.   Review of Systems:   ROS All other systems reviewed and are negative.  Medications:   Current Outpatient Medications on File Prior to Visit  Medication Sig Dispense Refill  . valsartan -hydroCHLOROthiazide  (DIOVAN -HCT) 80-12.5 mg tablet Take 1 tablet by mouth once daily    . valsartan  (DIOVAN ) 40 MG tablet Take 40 mg by mouth once daily     No current facility-administered medications on file prior to visit.    Physical Examination:   There were no vitals taken for this visit.  General: Well-developed, well-nourished, in no acute distress.   Left breast: Incisions are dry and intact. Left axilla: No obvious swelling, minimal palpable seroma without obvious cellulitis  A chaperone, Kathryn Delgado, CMA, was present during the exam.   Assessment and Plan:   Diagnoses and all orders for this visit:  Neoplasm of left breast, regional lymph node staging category pN0(mol+): positive molecular findings without regional lymph node metastasis detected by histology or immunohistochemistry (CMS/HHS-HCC)  Invasive ductal carcinoma of breast, left (CMS/HHS-HCC)    Donny DELENA Delgado is status post left breast lumpectomy and left axillary lymph node biopsy for left breast cancer by Dr. Vernetta followed by bilateral breast reduction by Dr. Lowery on 04/09/2024.  On exam today, she does not have  any visible swelling in the axilla.  I feel a small seroma but she does not have any major pain or overlying cellulitis.  I recommended continuing to monitor the area until follow-up next week with Dr. Vernetta.  She will call if she has any questions or concerns in the meantime.   Return if symptoms worsen or fail to improve.  Puja Maczis, [facility]

## 2024-05-15 ENCOUNTER — Encounter: Admitting: Student

## 2024-05-17 ENCOUNTER — Encounter: Admitting: Physician Assistant

## 2024-05-18 ENCOUNTER — Telehealth: Payer: Self-pay | Admitting: Hematology and Oncology

## 2024-05-18 ENCOUNTER — Encounter: Payer: Self-pay | Admitting: *Deleted

## 2024-05-18 ENCOUNTER — Telehealth: Payer: Self-pay | Admitting: Student

## 2024-05-18 DIAGNOSIS — Z170 Estrogen receptor positive status [ER+]: Secondary | ICD-10-CM

## 2024-05-18 NOTE — Telephone Encounter (Signed)
 We discussed the 6 weeks out from work at her preoperative appointment.  Her surgery was on 04/09/2024, 6 weeks from that point is 05/21/2024.  I am not sure what was discussed at any other appointment.

## 2024-05-18 NOTE — Telephone Encounter (Signed)
 Pt called and said that the FMLA paperwork has return to work date of 05/21/2024 but the work note from Dr. Lowery states 05/30/2024.  Please advise which date is correct, thank you.

## 2024-05-18 NOTE — Telephone Encounter (Signed)
 Left vm for pt about about scheduled appt date and time. Encouraged to call back if need to reschedule

## 2024-05-18 NOTE — Telephone Encounter (Signed)
 Pt is calling again, requesting her dates be updated, her work says it needs to be fixed for her to be paid. She says one says 05-21-24 and one says 05-30-24

## 2024-05-21 ENCOUNTER — Ambulatory Visit: Admission: RE | Admit: 2024-05-21 | Admitting: Radiation Oncology

## 2024-05-23 ENCOUNTER — Ambulatory Visit: Admitting: Physician Assistant

## 2024-05-23 VITALS — BP 108/70 | HR 102 | Temp 98.4°F | Ht 67.0 in | Wt 235.6 lb

## 2024-05-23 DIAGNOSIS — Z9889 Other specified postprocedural states: Secondary | ICD-10-CM

## 2024-05-23 NOTE — Progress Notes (Signed)
 Patient is a pleasant 58 year old female s/p oncoplastic reduction performed 04/09/2024 at time of lumpectomy who presents to clinic for postoperative follow-up.  She was last seen here in clinic on 05/01/2024 by Dr. Lowery.  At that time, exam was entirely benign, though she was describing neuropathic discomfort.  Recommended that she begin taking vitamin C.  Follow-up as scheduled.  Reviewed chart and per general surgery notes she has had a seroma that has been aspirated on multiple occasions.  Per note 05/21/2024 there is no residual fluid or seroma.  Today, patient is doing well from a postoperative standpoint.  There is no ongoing swelling or seroma.  Pain is controlled.  Patient does have an area of firmness on the left breast, but otherwise no complaints.  She is eager to proceed with nipple areolar tattoo restoration.  On exam, breasts have excellent shape and symmetry.  She does have a discrete area of firmness at the proximal aspect of vertical limb incision left breast.  Suspect fat necrosis versus small hematoma.  Incisions are CDI throughout.  Mildly dry appearing after removal of Steri-Strips.  Recommending Vaseline to the incisions throughout for the next week and then transition to silicone scar gel twice daily x 3 months.  Emphasized the importance of mechanical massage, particularly the area of discrete firmness.  She understands that fat necrosis is something that can take several months before resolution.  Patient to follow-up with Dr. Lowery in 2 months to discuss possible nipple areolar tattoo restoration.  Picture(s) obtained of the patient and placed in the chart were with the patient's or guardian's permission.

## 2024-05-28 ENCOUNTER — Ambulatory Visit: Admitting: Radiation Oncology

## 2024-05-28 ENCOUNTER — Encounter: Payer: Self-pay | Admitting: Licensed Clinical Social Worker

## 2024-05-28 NOTE — Progress Notes (Signed)
 [facility] CSW Progress Note  Clinical Child Psychotherapist contacted patient by phone to follow-up on Kathryn Delgado application.    Interventions: Notified patient of approval for Kathryn Delgado grant that will be sent to her via mail  Patient otherwise reports doing fairly well with coping. She is due to start back at work in the coming week.     Follow Up Plan:  Patient will contact CSW with any support or resource needs    Cinderella Christoffersen E Katlin Bortner, LCSW Clinical Social Worker [facility]

## 2024-05-29 ENCOUNTER — Ambulatory Visit: Admitting: Radiation Oncology

## 2024-05-30 ENCOUNTER — Ambulatory Visit

## 2024-06-04 ENCOUNTER — Ambulatory Visit

## 2024-06-05 ENCOUNTER — Ambulatory Visit

## 2024-06-06 ENCOUNTER — Ambulatory Visit

## 2024-06-08 ENCOUNTER — Ambulatory Visit
Admission: RE | Admit: 2024-06-08 | Discharge: 2024-06-08 | Disposition: A | Discharge status: Home / Self Care | Payer: Self-pay | Source: Ambulatory Visit | Attending: Radiation Oncology | Admitting: Radiation Oncology

## 2024-06-08 ENCOUNTER — Ambulatory Visit: Payer: Self-pay | Admitting: Radiation Oncology

## 2024-06-08 ENCOUNTER — Ambulatory Visit: Payer: Self-pay

## 2024-06-08 DIAGNOSIS — Z510 Encounter for antineoplastic radiation therapy: Secondary | ICD-10-CM | POA: Insufficient documentation

## 2024-06-08 DIAGNOSIS — Z170 Estrogen receptor positive status [ER+]: Secondary | ICD-10-CM | POA: Diagnosis not present

## 2024-06-08 DIAGNOSIS — C50112 Malignant neoplasm of central portion of left female breast: Secondary | ICD-10-CM | POA: Diagnosis not present

## 2024-06-08 DIAGNOSIS — K8020 Calculus of gallbladder without cholecystitis without obstruction: Secondary | ICD-10-CM | POA: Diagnosis not present

## 2024-06-11 ENCOUNTER — Ambulatory Visit: Payer: Self-pay

## 2024-06-12 ENCOUNTER — Ambulatory Visit: Payer: Self-pay

## 2024-06-13 ENCOUNTER — Ambulatory Visit: Payer: Self-pay

## 2024-06-14 ENCOUNTER — Ambulatory Visit: Payer: Self-pay

## 2024-06-14 DIAGNOSIS — Z510 Encounter for antineoplastic radiation therapy: Secondary | ICD-10-CM | POA: Diagnosis not present

## 2024-06-15 ENCOUNTER — Ambulatory Visit: Payer: Self-pay

## 2024-06-18 ENCOUNTER — Other Ambulatory Visit: Payer: Self-pay

## 2024-06-18 ENCOUNTER — Ambulatory Visit
Admission: RE | Admit: 2024-06-18 | Discharge: 2024-06-18 | Disposition: A | Discharge status: Home / Self Care | Payer: Self-pay | Source: Ambulatory Visit | Attending: Radiation Oncology | Admitting: Radiation Oncology

## 2024-06-18 DIAGNOSIS — Z510 Encounter for antineoplastic radiation therapy: Secondary | ICD-10-CM | POA: Diagnosis not present

## 2024-06-18 LAB — RAD ONC ARIA SESSION SUMMARY

## 2024-06-19 ENCOUNTER — Ambulatory Visit
Admission: RE | Admit: 2024-06-19 | Discharge: 2024-06-19 | Disposition: A | Discharge status: Home / Self Care | Payer: Self-pay | Source: Ambulatory Visit | Attending: Radiation Oncology | Admitting: Radiation Oncology

## 2024-06-19 ENCOUNTER — Other Ambulatory Visit: Payer: Self-pay

## 2024-06-19 DIAGNOSIS — Z510 Encounter for antineoplastic radiation therapy: Secondary | ICD-10-CM | POA: Diagnosis not present

## 2024-06-19 LAB — RAD ONC ARIA SESSION SUMMARY

## 2024-06-20 ENCOUNTER — Ambulatory Visit
Admission: RE | Admit: 2024-06-20 | Discharge: 2024-06-20 | Disposition: A | Discharge status: Home / Self Care | Payer: Self-pay | Source: Ambulatory Visit | Attending: Radiation Oncology | Admitting: Radiation Oncology

## 2024-06-20 ENCOUNTER — Other Ambulatory Visit: Payer: Self-pay

## 2024-06-20 DIAGNOSIS — C50112 Malignant neoplasm of central portion of left female breast: Secondary | ICD-10-CM

## 2024-06-20 DIAGNOSIS — K8020 Calculus of gallbladder without cholecystitis without obstruction: Secondary | ICD-10-CM

## 2024-06-20 DIAGNOSIS — Z510 Encounter for antineoplastic radiation therapy: Secondary | ICD-10-CM | POA: Diagnosis not present

## 2024-06-20 LAB — RAD ONC ARIA SESSION SUMMARY

## 2024-06-20 MED ORDER — RADIAPLEXRX EX GEL: Freq: Once | CUTANEOUS | Status: AC

## 2024-06-20 MED ORDER — ALRA NON-METALLIC DEODORANT (RAD-ONC)
1.0000 | Freq: Once | TOPICAL | Status: AC
  Administered 2024-06-20: 1 via TOPICAL

## 2024-06-21 ENCOUNTER — Ambulatory Visit
Admission: RE | Admit: 2024-06-21 | Discharge: 2024-06-21 | Disposition: A | Discharge status: Home / Self Care | Payer: Self-pay | Source: Ambulatory Visit | Attending: Radiation Oncology | Admitting: Radiation Oncology

## 2024-06-21 ENCOUNTER — Other Ambulatory Visit: Payer: Self-pay

## 2024-06-21 DIAGNOSIS — Z510 Encounter for antineoplastic radiation therapy: Secondary | ICD-10-CM | POA: Diagnosis not present

## 2024-06-21 LAB — RAD ONC ARIA SESSION SUMMARY

## 2024-06-22 ENCOUNTER — Other Ambulatory Visit: Payer: Self-pay

## 2024-06-22 ENCOUNTER — Ambulatory Visit
Admission: RE | Admit: 2024-06-22 | Discharge: 2024-06-22 | Disposition: A | Discharge status: Home / Self Care | Payer: Self-pay | Source: Ambulatory Visit | Attending: Radiation Oncology | Admitting: Radiation Oncology

## 2024-06-22 DIAGNOSIS — Z510 Encounter for antineoplastic radiation therapy: Secondary | ICD-10-CM | POA: Diagnosis not present

## 2024-06-22 LAB — RAD ONC ARIA SESSION SUMMARY
Course Elapsed Days: 4
Plan Fractions Treated to Date: 5
Plan Fractions Treated to Date: 5
Plan Prescribed Dose Per Fraction: 1.8 Gy
Plan Prescribed Dose Per Fraction: 1.8 Gy
Plan Total Fractions Prescribed: 28
Plan Total Fractions Prescribed: 28
Plan Total Prescribed Dose: 50.4 Gy
Plan Total Prescribed Dose: 50.4 Gy
Reference Point Dosage Given to Date: 9 Gy
Reference Point Dosage Given to Date: 9 Gy
Reference Point Session Dosage Given: 1.8 Gy
Reference Point Session Dosage Given: 1.8 Gy
Session Number: 5

## 2024-06-25 ENCOUNTER — Other Ambulatory Visit: Payer: Self-pay

## 2024-06-25 ENCOUNTER — Ambulatory Visit
Admission: RE | Admit: 2024-06-25 | Discharge: 2024-06-25 | Disposition: A | Discharge status: Home / Self Care | Payer: Self-pay | Source: Ambulatory Visit | Attending: Radiation Oncology | Admitting: Radiation Oncology

## 2024-06-25 DIAGNOSIS — Z510 Encounter for antineoplastic radiation therapy: Secondary | ICD-10-CM | POA: Diagnosis not present

## 2024-06-25 LAB — RAD ONC ARIA SESSION SUMMARY

## 2024-06-26 ENCOUNTER — Other Ambulatory Visit: Payer: Self-pay

## 2024-06-26 ENCOUNTER — Ambulatory Visit
Admission: RE | Admit: 2024-06-26 | Discharge: 2024-06-26 | Disposition: A | Discharge status: Home / Self Care | Payer: Self-pay | Source: Ambulatory Visit | Attending: Radiation Oncology | Admitting: Radiation Oncology

## 2024-06-26 DIAGNOSIS — Z510 Encounter for antineoplastic radiation therapy: Secondary | ICD-10-CM | POA: Diagnosis not present

## 2024-06-26 LAB — RAD ONC ARIA SESSION SUMMARY

## 2024-06-27 ENCOUNTER — Telehealth: Payer: Self-pay | Admitting: Radiation Oncology

## 2024-06-27 ENCOUNTER — Ambulatory Visit: Payer: Self-pay

## 2024-06-27 NOTE — Telephone Encounter (Signed)
 1/21 Patient called to cancel her treatment appt on today due to not feeling well.  Email sent to Support RTT and copied Nursing and L2 machine, so they are aware.

## 2024-06-28 ENCOUNTER — Other Ambulatory Visit: Payer: Self-pay

## 2024-06-28 ENCOUNTER — Ambulatory Visit
Admission: RE | Admit: 2024-06-28 | Discharge: 2024-06-28 | Disposition: A | Discharge status: Home / Self Care | Payer: Self-pay | Source: Ambulatory Visit | Attending: Radiation Oncology | Admitting: Radiation Oncology

## 2024-06-28 ENCOUNTER — Ambulatory Visit: Payer: Self-pay

## 2024-06-28 DIAGNOSIS — Z510 Encounter for antineoplastic radiation therapy: Secondary | ICD-10-CM | POA: Diagnosis not present

## 2024-06-28 LAB — RAD ONC ARIA SESSION SUMMARY
Course Elapsed Days: 10
Plan Fractions Treated to Date: 8
Plan Fractions Treated to Date: 8
Plan Prescribed Dose Per Fraction: 1.8 Gy
Plan Prescribed Dose Per Fraction: 1.8 Gy
Plan Total Fractions Prescribed: 28
Plan Total Fractions Prescribed: 28
Plan Total Prescribed Dose: 50.4 Gy
Plan Total Prescribed Dose: 50.4 Gy
Reference Point Dosage Given to Date: 14.4 Gy
Reference Point Dosage Given to Date: 14.4 Gy
Reference Point Session Dosage Given: 1.8 Gy
Reference Point Session Dosage Given: 1.8 Gy
Session Number: 8

## 2024-06-29 ENCOUNTER — Ambulatory Visit: Payer: Self-pay

## 2024-06-29 ENCOUNTER — Other Ambulatory Visit: Payer: Self-pay

## 2024-06-29 ENCOUNTER — Ambulatory Visit
Admission: RE | Admit: 2024-06-29 | Discharge: 2024-06-29 | Disposition: A | Discharge status: Home / Self Care | Payer: Self-pay | Source: Ambulatory Visit | Attending: Radiation Oncology | Admitting: Radiation Oncology

## 2024-06-29 DIAGNOSIS — Z510 Encounter for antineoplastic radiation therapy: Secondary | ICD-10-CM | POA: Diagnosis not present

## 2024-06-29 LAB — RAD ONC ARIA SESSION SUMMARY
Course Elapsed Days: 11
Plan Fractions Treated to Date: 9
Plan Fractions Treated to Date: 9
Plan Prescribed Dose Per Fraction: 1.8 Gy
Plan Prescribed Dose Per Fraction: 1.8 Gy
Plan Total Fractions Prescribed: 28
Plan Total Fractions Prescribed: 28
Plan Total Prescribed Dose: 50.4 Gy
Plan Total Prescribed Dose: 50.4 Gy
Reference Point Dosage Given to Date: 16.2 Gy
Reference Point Dosage Given to Date: 16.2 Gy
Reference Point Session Dosage Given: 1.8 Gy
Reference Point Session Dosage Given: 1.8 Gy
Session Number: 9

## 2024-07-02 ENCOUNTER — Ambulatory Visit: Payer: Self-pay

## 2024-07-03 ENCOUNTER — Ambulatory Visit: Payer: Self-pay

## 2024-07-03 DIAGNOSIS — Z510 Encounter for antineoplastic radiation therapy: Secondary | ICD-10-CM | POA: Diagnosis not present

## 2024-07-04 ENCOUNTER — Ambulatory Visit
Admission: RE | Admit: 2024-07-04 | Discharge: 2024-07-04 | Disposition: A | Discharge status: Home / Self Care | Payer: Self-pay | Source: Ambulatory Visit | Attending: Radiation Oncology | Admitting: Radiation Oncology

## 2024-07-04 ENCOUNTER — Other Ambulatory Visit: Payer: Self-pay

## 2024-07-04 ENCOUNTER — Telehealth: Payer: Self-pay

## 2024-07-04 LAB — RAD ONC ARIA SESSION SUMMARY
Course Elapsed Days: 16
Plan Fractions Treated to Date: 10
Plan Fractions Treated to Date: 10
Plan Prescribed Dose Per Fraction: 1.8 Gy
Plan Prescribed Dose Per Fraction: 1.8 Gy
Plan Total Fractions Prescribed: 28
Plan Total Fractions Prescribed: 28
Plan Total Prescribed Dose: 50.4 Gy
Plan Total Prescribed Dose: 50.4 Gy
Reference Point Dosage Given to Date: 18 Gy
Reference Point Dosage Given to Date: 18 Gy
Reference Point Session Dosage Given: 1.8 Gy
Reference Point Session Dosage Given: 1.8 Gy
Session Number: 10

## 2024-07-04 NOTE — Telephone Encounter (Signed)
 LVM for pt regarding FMLA. Left call back number for info.

## 2024-07-05 ENCOUNTER — Ambulatory Visit
Admission: RE | Admit: 2024-07-05 | Discharge: 2024-07-05 | Disposition: A | Discharge status: Home / Self Care | Payer: Self-pay | Source: Ambulatory Visit | Attending: Radiation Oncology | Admitting: Radiation Oncology

## 2024-07-05 ENCOUNTER — Other Ambulatory Visit: Payer: Self-pay

## 2024-07-05 LAB — RAD ONC ARIA SESSION SUMMARY
Course Elapsed Days: 17
Plan Fractions Treated to Date: 11
Plan Fractions Treated to Date: 11
Plan Prescribed Dose Per Fraction: 1.8 Gy
Plan Prescribed Dose Per Fraction: 1.8 Gy
Plan Total Fractions Prescribed: 28
Plan Total Fractions Prescribed: 28
Plan Total Prescribed Dose: 50.4 Gy
Plan Total Prescribed Dose: 50.4 Gy
Reference Point Dosage Given to Date: 19.8 Gy
Reference Point Dosage Given to Date: 19.8 Gy
Reference Point Session Dosage Given: 1.8 Gy
Reference Point Session Dosage Given: 1.8 Gy
Session Number: 11

## 2024-07-06 ENCOUNTER — Ambulatory Visit
Admission: RE | Admit: 2024-07-06 | Discharge: 2024-07-06 | Disposition: A | Payer: Self-pay | Source: Ambulatory Visit | Attending: Radiation Oncology

## 2024-07-06 ENCOUNTER — Other Ambulatory Visit: Payer: Self-pay

## 2024-07-06 ENCOUNTER — Ambulatory Visit: Payer: Self-pay | Admitting: Radiation Oncology

## 2024-07-06 LAB — RAD ONC ARIA SESSION SUMMARY
Course Elapsed Days: 18
Plan Fractions Treated to Date: 12
Plan Fractions Treated to Date: 12
Plan Prescribed Dose Per Fraction: 1.8 Gy
Plan Prescribed Dose Per Fraction: 1.8 Gy
Plan Total Fractions Prescribed: 28
Plan Total Fractions Prescribed: 28
Plan Total Prescribed Dose: 50.4 Gy
Plan Total Prescribed Dose: 50.4 Gy
Reference Point Dosage Given to Date: 21.6 Gy
Reference Point Dosage Given to Date: 21.6 Gy
Reference Point Session Dosage Given: 1.8 Gy
Reference Point Session Dosage Given: 1.8 Gy
Session Number: 12

## 2024-07-09 ENCOUNTER — Ambulatory Visit: Payer: Self-pay

## 2024-07-09 NOTE — Telephone Encounter (Signed)
 Pt scheduled for tomorrow.

## 2024-07-09 NOTE — Telephone Encounter (Signed)
 FYI Only or Action Required?: FYI only for provider: ED advised.  Patient was last seen in primary care on 04/25/2024 by Kathryn Alm RAMAN, PA-C.  Called Nurse Triage reporting Shoulder Pain.  Symptoms began several weeks ago.  Interventions attempted: OTC medications: Ibuprofen, Motrin, ointments and Ice/heat application.  Symptoms are: gradually worsening.  Triage Disposition: Go to ED Now (or PCP Triage)  Patient/caregiver understands and will follow disposition?: No, wishes to speak with PCP  Summary: Shoulder pain   Reason for Triage: Pt calling reports she is having worsening pain and stiffness in both shoulders, going down to hands, unable to lift anything, and making it difficult to function and work.  Pt reports has tried taking medications, cream, ointments and nothing is helping.  Pt requesting an appt for evaluation         Reason for Disposition  [1] SEVERE pain AND [2] not improved 2 hours after pain medicine  Answer Assessment - Initial Assessment Questions Patient states that she went to [facility] about 2 weeks ago for shoulder pain and they told her it is arthritis. She has been using creams, Ibuprofen, and Motrin with no relief. Pain is 10/10 and she is having difficulty lifting arms and sleeping. ED advised for pain control, refused.   1. ONSET: When did the pain start?     A couple weeks ago  2. LOCATION: Where is the pain located?     Bilateral shoulders, arms, and hands  3. PAIN: How bad is the pain? (Scale 1-10; or mild, moderate, severe)     10/10  4. WORK OR EXERCISE: Has there been any recent work or exercise that involved this part of the body?     No  5. CAUSE: What do you think is causing the shoulder pain?     Arthritis  6. OTHER SYMPTOMS: Do you have any other symptoms? (e.g., neck pain, swelling, rash, fever, numbness, weakness)     Shoulder stiffness  7. PREGNANCY: Is there any chance you are pregnant? When was  your last menstrual period?     NA  Protocols used: Shoulder Pain-A-AH

## 2024-07-10 ENCOUNTER — Ambulatory Visit: Payer: Self-pay | Admitting: Family Medicine

## 2024-07-10 ENCOUNTER — Ambulatory Visit: Payer: Self-pay

## 2024-07-10 ENCOUNTER — Ambulatory Visit
Admission: RE | Admit: 2024-07-10 | Discharge: 2024-07-10 | Disposition: A | Payer: Self-pay | Source: Ambulatory Visit | Attending: Radiation Oncology

## 2024-07-10 ENCOUNTER — Other Ambulatory Visit: Payer: Self-pay

## 2024-07-10 VITALS — BP 132/80 | HR 84 | Wt 220.0 lb

## 2024-07-10 DIAGNOSIS — M19012 Primary osteoarthritis, left shoulder: Secondary | ICD-10-CM

## 2024-07-10 DIAGNOSIS — M19011 Primary osteoarthritis, right shoulder: Secondary | ICD-10-CM | POA: Diagnosis not present

## 2024-07-10 DIAGNOSIS — M25511 Pain in right shoulder: Secondary | ICD-10-CM

## 2024-07-10 LAB — RAD ONC ARIA SESSION SUMMARY
Course Elapsed Days: 22
Plan Fractions Treated to Date: 13
Plan Fractions Treated to Date: 13
Plan Prescribed Dose Per Fraction: 1.8 Gy
Plan Prescribed Dose Per Fraction: 1.8 Gy
Plan Total Fractions Prescribed: 28
Plan Total Fractions Prescribed: 28
Plan Total Prescribed Dose: 50.4 Gy
Plan Total Prescribed Dose: 50.4 Gy
Reference Point Dosage Given to Date: 23.4 Gy
Reference Point Dosage Given to Date: 23.4 Gy
Reference Point Session Dosage Given: 1.8 Gy
Reference Point Session Dosage Given: 1.8 Gy
Session Number: 13

## 2024-07-10 MED ORDER — DICLOFENAC SODIUM 75 MG PO TBEC: 75.0000 mg | DELAYED_RELEASE_TABLET | Freq: Two times a day (BID) | ORAL | 1 refills | Status: AC

## 2024-07-10 NOTE — Progress Notes (Signed)
" ° °  Subjective:    Patient ID: Kathryn Delgado, female    DOB: 05-27-1966, 59 y.o.   MRN: [id number]  Discussed the use of AI scribe software for clinical note transcription with the patient, who gave verbal consent to proceed.  History of Present Illness   Kathryn Delgado is a 59 year old female with arthritis who presents with bilateral shoulder pain.  She has been experiencing severe bilateral shoulder pain for approximately one month. The pain is excruciating, making it difficult for her to raise her arms or put on a shirt. It is localized in both shoulders and radiates across her back. No specific injury is reported, but her job at [employer] about 35 pounds, which she has been doing for five years. She has been unable to work for the past week due to the pain and the lack of light duty options at her job.  She states that she did file an FMLA intermittently for this problem.  She did this on her own.  She has been taking 800 mg of ibuprofen twice daily for pain relief, but it only provides minimal relief and causes drowsiness. She has a history of using prednisone  for knee and foot arthritis, prescribed by different providers. She reports that x-rays were taken two weeks ago and she was told she has arthritis, but the specific type was not communicated to her. She received injections in both shoulders on January 22nd, which provided relief for about a week, but the pain has since returned.  She was seen by Dr. Yvone on the 22nd.  He did recommend if continued difficulty referral to Dr. Dozier.  She is frustrated with the lack of improvement and the suggestion of surgery, stating 'I'm tired of getting cut on.' Her history of cancer treatment contributes to her reluctance for further surgical interventions.           Review of Systems     Objective:    Physical Exam Physical Exam    Alert and in no distress otherwise not examined     Review of the notes does  indicate that she does have arthritis 1 side worse than the other with bone-on-bone.      Assessment & Plan:      Bilateral shoulder osteoarthritis with pain  severe bilateral shoulder pain due to bone-on-bone arthritis, more severe on the left. Ibuprofen insufficient. Reluctant to consider surgery. - Prescribed Voltaren  (diclofenac ) twice daily. - Referred to Dr. Dozier for further evaluation and management.        "

## 2024-07-11 ENCOUNTER — Ambulatory Visit: Payer: Self-pay

## 2024-07-11 ENCOUNTER — Ambulatory Visit: Admission: RE | Admit: 2024-07-11 | Discharge: 2024-07-11 | Payer: Self-pay | Attending: Radiation Oncology

## 2024-07-11 ENCOUNTER — Other Ambulatory Visit: Payer: Self-pay

## 2024-07-11 LAB — RAD ONC ARIA SESSION SUMMARY
Course Elapsed Days: 23
Plan Fractions Treated to Date: 14
Plan Fractions Treated to Date: 14
Plan Prescribed Dose Per Fraction: 1.8 Gy
Plan Prescribed Dose Per Fraction: 1.8 Gy
Plan Total Fractions Prescribed: 28
Plan Total Fractions Prescribed: 28
Plan Total Prescribed Dose: 50.4 Gy
Plan Total Prescribed Dose: 50.4 Gy
Reference Point Dosage Given to Date: 25.2 Gy
Reference Point Dosage Given to Date: 25.2 Gy
Reference Point Session Dosage Given: 1.8 Gy
Reference Point Session Dosage Given: 1.8 Gy
Session Number: 14

## 2024-07-12 ENCOUNTER — Telehealth: Payer: Self-pay | Admitting: Medical

## 2024-07-12 ENCOUNTER — Other Ambulatory Visit: Payer: Self-pay

## 2024-07-12 ENCOUNTER — Ambulatory Visit
Admission: RE | Admit: 2024-07-12 | Discharge: 2024-07-12 | Disposition: A | Source: Ambulatory Visit | Attending: Radiation Oncology

## 2024-07-12 ENCOUNTER — Ambulatory Visit: Admission: RE | Admit: 2024-07-12 | Discharge: 2024-07-12 | Payer: Self-pay | Attending: Radiation Oncology

## 2024-07-12 ENCOUNTER — Other Ambulatory Visit: Payer: Self-pay | Admitting: Radiation Oncology

## 2024-07-12 DIAGNOSIS — C50112 Malignant neoplasm of central portion of left female breast: Secondary | ICD-10-CM

## 2024-07-12 LAB — RAD ONC ARIA SESSION SUMMARY
Course Elapsed Days: 24
Plan Fractions Treated to Date: 15
Plan Fractions Treated to Date: 15
Plan Prescribed Dose Per Fraction: 1.8 Gy
Plan Prescribed Dose Per Fraction: 1.8 Gy
Plan Total Fractions Prescribed: 28
Plan Total Fractions Prescribed: 28
Plan Total Prescribed Dose: 50.4 Gy
Plan Total Prescribed Dose: 50.4 Gy
Reference Point Dosage Given to Date: 27 Gy
Reference Point Dosage Given to Date: 27 Gy
Reference Point Session Dosage Given: 1.8 Gy
Reference Point Session Dosage Given: 1.8 Gy
Session Number: 15

## 2024-07-12 NOTE — Telephone Encounter (Signed)
 Can we get patient seen at [facility] with Dr. dozier

## 2024-07-12 NOTE — Telephone Encounter (Signed)
 Copied from CRM [id number]. Topic: Referral - Question >> Jul 12, 2024 11:27 AM Gustabo D wrote: Pt says [facility] called and told her they don't have a doctor by the name the referral was sent over with. She wants to know if she can be sent somewhere else. Would like a phone call.

## 2024-07-13 ENCOUNTER — Ambulatory Visit

## 2024-07-13 ENCOUNTER — Ambulatory Visit: Admission: RE | Admit: 2024-07-13 | Payer: Self-pay

## 2024-07-13 ENCOUNTER — Other Ambulatory Visit: Payer: Self-pay

## 2024-07-13 LAB — RAD ONC ARIA SESSION SUMMARY
Course Elapsed Days: 25
Plan Fractions Treated to Date: 16
Plan Fractions Treated to Date: 16
Plan Prescribed Dose Per Fraction: 1.8 Gy
Plan Prescribed Dose Per Fraction: 1.8 Gy
Plan Total Fractions Prescribed: 28
Plan Total Fractions Prescribed: 28
Plan Total Prescribed Dose: 50.4 Gy
Plan Total Prescribed Dose: 50.4 Gy
Reference Point Dosage Given to Date: 28.8 Gy
Reference Point Dosage Given to Date: 28.8 Gy
Reference Point Session Dosage Given: 1.8 Gy
Reference Point Session Dosage Given: 1.8 Gy
Session Number: 16

## 2024-07-16 ENCOUNTER — Ambulatory Visit: Payer: Self-pay

## 2024-07-17 ENCOUNTER — Ambulatory Visit

## 2024-07-17 ENCOUNTER — Ambulatory Visit: Payer: Self-pay

## 2024-07-18 ENCOUNTER — Ambulatory Visit: Payer: Self-pay

## 2024-07-19 ENCOUNTER — Ambulatory Visit: Payer: Self-pay

## 2024-07-19 ENCOUNTER — Inpatient Hospital Stay: Payer: Self-pay | Attending: Genetic Counselor | Admitting: Hematology and Oncology

## 2024-07-20 ENCOUNTER — Ambulatory Visit: Payer: Self-pay

## 2024-07-23 ENCOUNTER — Ambulatory Visit: Payer: Self-pay

## 2024-07-24 ENCOUNTER — Ambulatory Visit: Payer: Self-pay

## 2024-07-25 ENCOUNTER — Ambulatory Visit: Payer: Self-pay

## 2024-07-26 ENCOUNTER — Ambulatory Visit: Payer: Self-pay

## 2024-07-27 ENCOUNTER — Ambulatory Visit: Payer: Self-pay

## 2024-07-30 ENCOUNTER — Ambulatory Visit: Payer: Self-pay

## 2024-07-31 ENCOUNTER — Ambulatory Visit: Payer: Self-pay

## 2024-08-01 ENCOUNTER — Ambulatory Visit: Payer: Self-pay

## 2024-08-02 ENCOUNTER — Ambulatory Visit: Payer: Self-pay

## 2024-08-03 ENCOUNTER — Ambulatory Visit: Payer: Self-pay

## 2024-08-06 ENCOUNTER — Ambulatory Visit: Payer: Self-pay

## 2024-08-07 ENCOUNTER — Ambulatory Visit: Payer: Self-pay

## 2024-08-21 ENCOUNTER — Ambulatory Visit: Admitting: Plastic Surgery

## 2024-10-11 ENCOUNTER — Inpatient Hospital Stay: Payer: Self-pay | Admitting: Adult Health

## 2024-10-11 ENCOUNTER — Inpatient Hospital Stay: Payer: Self-pay
